# Patient Record
Sex: Female | Born: 1945 | Race: White | Hispanic: No | Smoking: Never smoker
Health system: Southern US, Community
[De-identification: ages and names within clinical notes are randomized; demographics above are authoritative.]

## PROBLEM LIST (undated history)

## (undated) DIAGNOSIS — F32A Depression, unspecified: Secondary | ICD-10-CM

## (undated) DIAGNOSIS — R51 Headache: Secondary | ICD-10-CM

## (undated) DIAGNOSIS — F329 Major depressive disorder, single episode, unspecified: Secondary | ICD-10-CM

## (undated) DIAGNOSIS — K766 Portal hypertension: Secondary | ICD-10-CM

## (undated) DIAGNOSIS — G4730 Sleep apnea, unspecified: Secondary | ICD-10-CM

## (undated) DIAGNOSIS — D696 Thrombocytopenia, unspecified: Secondary | ICD-10-CM

## (undated) DIAGNOSIS — G2581 Restless legs syndrome: Secondary | ICD-10-CM

## (undated) DIAGNOSIS — F419 Anxiety disorder, unspecified: Secondary | ICD-10-CM

## (undated) DIAGNOSIS — R011 Cardiac murmur, unspecified: Secondary | ICD-10-CM

## (undated) DIAGNOSIS — T4145XA Adverse effect of unspecified anesthetic, initial encounter: Secondary | ICD-10-CM

## (undated) DIAGNOSIS — N189 Chronic kidney disease, unspecified: Secondary | ICD-10-CM

## (undated) DIAGNOSIS — Z8489 Family history of other specified conditions: Secondary | ICD-10-CM

## (undated) DIAGNOSIS — J45909 Unspecified asthma, uncomplicated: Secondary | ICD-10-CM

## (undated) DIAGNOSIS — I471 Supraventricular tachycardia, unspecified: Secondary | ICD-10-CM

## (undated) DIAGNOSIS — D759 Disease of blood and blood-forming organs, unspecified: Secondary | ICD-10-CM

## (undated) DIAGNOSIS — K7460 Unspecified cirrhosis of liver: Secondary | ICD-10-CM

## (undated) DIAGNOSIS — C801 Malignant (primary) neoplasm, unspecified: Secondary | ICD-10-CM

## (undated) DIAGNOSIS — T8859XA Other complications of anesthesia, initial encounter: Secondary | ICD-10-CM

## (undated) DIAGNOSIS — I10 Essential (primary) hypertension: Secondary | ICD-10-CM

## (undated) DIAGNOSIS — R0602 Shortness of breath: Secondary | ICD-10-CM

## (undated) DIAGNOSIS — Z8719 Personal history of other diseases of the digestive system: Secondary | ICD-10-CM

## (undated) HISTORY — PX: MASTECTOMY: SHX3

## (undated) HISTORY — PX: APPENDECTOMY: SHX54

## (undated) HISTORY — DX: Supraventricular tachycardia: I47.1

## (undated) HISTORY — PX: COLONOSCOPY W/ BIOPSIES AND POLYPECTOMY: SHX1376

## (undated) HISTORY — PX: BREAST SURGERY: SHX581

## (undated) HISTORY — PX: TONSILLECTOMY: SUR1361

## (undated) HISTORY — PX: CHOLECYSTECTOMY: SHX55

## (undated) HISTORY — PX: ABDOMINAL HYSTERECTOMY: SHX81

## (undated) HISTORY — DX: Supraventricular tachycardia, unspecified: I47.10

---

## 1973-03-05 HISTORY — PX: CARDIAC CATHETERIZATION: SHX172

## 1998-02-06 ENCOUNTER — Emergency Department (HOSPITAL_COMMUNITY): Admission: EM | Admit: 1998-02-06 | Discharge: 1998-02-06 | Payer: Self-pay

## 1998-02-06 ENCOUNTER — Encounter: Payer: Self-pay | Admitting: Endocrinology

## 1998-08-05 ENCOUNTER — Ambulatory Visit (HOSPITAL_COMMUNITY): Admission: RE | Admit: 1998-08-05 | Discharge: 1998-08-05 | Payer: Self-pay | Admitting: Gastroenterology

## 1999-03-01 ENCOUNTER — Other Ambulatory Visit: Admission: RE | Admit: 1999-03-01 | Discharge: 1999-03-01 | Payer: Self-pay | Admitting: Gynecology

## 1999-08-27 ENCOUNTER — Emergency Department (HOSPITAL_COMMUNITY): Admission: EM | Admit: 1999-08-27 | Discharge: 1999-08-27 | Payer: Self-pay | Admitting: Emergency Medicine

## 1999-08-27 ENCOUNTER — Encounter: Payer: Self-pay | Admitting: Emergency Medicine

## 2000-09-23 ENCOUNTER — Other Ambulatory Visit: Admission: RE | Admit: 2000-09-23 | Discharge: 2000-09-23 | Payer: Self-pay | Admitting: Gynecology

## 2001-07-14 ENCOUNTER — Encounter: Admission: RE | Admit: 2001-07-14 | Discharge: 2001-07-14 | Payer: Self-pay | Admitting: Gastroenterology

## 2001-07-14 ENCOUNTER — Encounter: Payer: Self-pay | Admitting: Gastroenterology

## 2002-06-10 ENCOUNTER — Other Ambulatory Visit: Admission: RE | Admit: 2002-06-10 | Discharge: 2002-06-10 | Payer: Self-pay | Admitting: Radiology

## 2002-06-19 ENCOUNTER — Encounter: Admission: RE | Admit: 2002-06-19 | Discharge: 2002-06-19 | Payer: Self-pay | Admitting: Surgery

## 2002-06-19 ENCOUNTER — Encounter: Payer: Self-pay | Admitting: Surgery

## 2002-06-22 ENCOUNTER — Ambulatory Visit (HOSPITAL_BASED_OUTPATIENT_CLINIC_OR_DEPARTMENT_OTHER): Admission: RE | Admit: 2002-06-22 | Discharge: 2002-06-22 | Payer: Self-pay | Admitting: Surgery

## 2002-06-22 ENCOUNTER — Encounter: Payer: Self-pay | Admitting: Surgery

## 2002-06-22 ENCOUNTER — Encounter (INDEPENDENT_AMBULATORY_CARE_PROVIDER_SITE_OTHER): Payer: Self-pay | Admitting: Specialist

## 2002-07-14 ENCOUNTER — Ambulatory Visit: Admission: RE | Admit: 2002-07-14 | Discharge: 2002-09-22 | Payer: Self-pay | Admitting: Radiation Oncology

## 2002-10-20 ENCOUNTER — Ambulatory Visit (HOSPITAL_COMMUNITY): Admission: RE | Admit: 2002-10-20 | Discharge: 2002-10-20 | Payer: Self-pay | Admitting: Oncology

## 2002-10-20 ENCOUNTER — Encounter: Payer: Self-pay | Admitting: Oncology

## 2002-10-28 ENCOUNTER — Ambulatory Visit (HOSPITAL_COMMUNITY): Admission: RE | Admit: 2002-10-28 | Discharge: 2002-10-28 | Payer: Self-pay | Admitting: Gastroenterology

## 2002-10-28 ENCOUNTER — Encounter (INDEPENDENT_AMBULATORY_CARE_PROVIDER_SITE_OTHER): Payer: Self-pay | Admitting: Specialist

## 2003-03-06 DIAGNOSIS — C801 Malignant (primary) neoplasm, unspecified: Secondary | ICD-10-CM

## 2003-03-06 HISTORY — DX: Malignant (primary) neoplasm, unspecified: C80.1

## 2003-05-17 ENCOUNTER — Ambulatory Visit (HOSPITAL_COMMUNITY): Admission: RE | Admit: 2003-05-17 | Discharge: 2003-05-17 | Payer: Self-pay | Admitting: Endocrinology

## 2003-05-18 ENCOUNTER — Encounter: Admission: RE | Admit: 2003-05-18 | Discharge: 2003-05-18 | Payer: Self-pay | Admitting: Oncology

## 2004-01-03 ENCOUNTER — Encounter: Admission: RE | Admit: 2004-01-03 | Discharge: 2004-01-03 | Payer: Self-pay | Admitting: Oncology

## 2004-01-05 ENCOUNTER — Encounter: Admission: RE | Admit: 2004-01-05 | Discharge: 2004-01-05 | Payer: Self-pay | Admitting: Oncology

## 2004-05-04 ENCOUNTER — Ambulatory Visit: Payer: Self-pay | Admitting: Oncology

## 2004-07-18 ENCOUNTER — Encounter: Admission: RE | Admit: 2004-07-18 | Discharge: 2004-07-18 | Payer: Self-pay | Admitting: Oncology

## 2004-07-24 ENCOUNTER — Ambulatory Visit: Payer: Self-pay | Admitting: Oncology

## 2004-11-02 ENCOUNTER — Ambulatory Visit: Payer: Self-pay | Admitting: Oncology

## 2005-01-04 ENCOUNTER — Encounter: Admission: RE | Admit: 2005-01-04 | Discharge: 2005-01-04 | Payer: Self-pay | Admitting: Oncology

## 2005-07-29 ENCOUNTER — Ambulatory Visit: Payer: Self-pay | Admitting: Oncology

## 2006-01-23 ENCOUNTER — Encounter: Admission: RE | Admit: 2006-01-23 | Discharge: 2006-01-23 | Payer: Self-pay | Admitting: Oncology

## 2006-03-08 ENCOUNTER — Ambulatory Visit: Payer: Self-pay | Admitting: Oncology

## 2006-09-13 ENCOUNTER — Ambulatory Visit: Payer: Self-pay | Admitting: Oncology

## 2007-04-08 ENCOUNTER — Encounter: Admission: RE | Admit: 2007-04-08 | Discharge: 2007-04-08 | Payer: Self-pay | Admitting: Orthopaedic Surgery

## 2007-04-15 ENCOUNTER — Encounter: Admission: RE | Admit: 2007-04-15 | Discharge: 2007-05-07 | Payer: Self-pay | Admitting: Orthopaedic Surgery

## 2007-04-16 ENCOUNTER — Encounter: Admission: RE | Admit: 2007-04-16 | Discharge: 2007-04-16 | Payer: Self-pay | Admitting: Oncology

## 2007-04-23 ENCOUNTER — Ambulatory Visit: Payer: Self-pay | Admitting: Oncology

## 2008-01-28 ENCOUNTER — Ambulatory Visit: Payer: Self-pay | Admitting: Oncology

## 2008-01-28 LAB — MORPHOLOGY: PLT EST: DECREASED

## 2008-01-28 LAB — COMPREHENSIVE METABOLIC PANEL
Albumin: 3.7 g/dL (ref 3.5–5.2)
BUN: 11 mg/dL (ref 6–23)
Calcium: 8.5 mg/dL (ref 8.4–10.5)
Chloride: 107 mEq/L (ref 96–112)
Glucose, Bld: 145 mg/dL — ABNORMAL HIGH (ref 70–99)
Potassium: 3.2 mEq/L — ABNORMAL LOW (ref 3.5–5.3)

## 2008-01-28 LAB — CHCC SMEAR

## 2008-01-28 LAB — CBC WITH DIFFERENTIAL/PLATELET
Basophils Absolute: 0 10*3/uL (ref 0.0–0.1)
HCT: 32.2 % — ABNORMAL LOW (ref 34.8–46.6)
HGB: 11.4 g/dL — ABNORMAL LOW (ref 11.6–15.9)
MCH: 30.5 pg (ref 26.0–34.0)
MONO#: 0.1 10*3/uL (ref 0.1–0.9)
NEUT%: 53.2 % (ref 39.6–76.8)
Platelets: 59 10*3/uL — ABNORMAL LOW (ref 145–400)
WBC: 2.4 10*3/uL — ABNORMAL LOW (ref 3.9–10.0)
lymph#: 0.9 10*3/uL (ref 0.9–3.3)

## 2008-01-30 ENCOUNTER — Ambulatory Visit (HOSPITAL_COMMUNITY): Admission: RE | Admit: 2008-01-30 | Discharge: 2008-01-30 | Payer: Self-pay | Admitting: Oncology

## 2008-02-03 LAB — MORPHOLOGY
PLT EST: DECREASED
RBC Comments: NORMAL

## 2008-02-03 LAB — CBC WITH DIFFERENTIAL/PLATELET
Basophils Absolute: 0 10*3/uL (ref 0.0–0.1)
EOS%: 0 % (ref 0.0–7.0)
HGB: 12.5 g/dL (ref 11.6–15.9)
MCH: 30.4 pg (ref 26.0–34.0)
NEUT#: 1.8 10*3/uL (ref 1.5–6.5)
RDW: 15 % — ABNORMAL HIGH (ref 11.3–14.5)
lymph#: 1 10*3/uL (ref 0.9–3.3)

## 2008-02-12 ENCOUNTER — Encounter: Payer: Self-pay | Admitting: Oncology

## 2008-02-12 ENCOUNTER — Other Ambulatory Visit: Admission: RE | Admit: 2008-02-12 | Discharge: 2008-02-12 | Payer: Self-pay | Admitting: Oncology

## 2008-02-19 LAB — CBC WITH DIFFERENTIAL/PLATELET
BASO%: 0.1 % (ref 0.0–2.0)
Basophils Absolute: 0 10*3/uL (ref 0.0–0.1)
EOS%: 0 % (ref 0.0–7.0)
HGB: 13.7 g/dL (ref 11.6–15.9)
MCH: 30.5 pg (ref 26.0–34.0)
MCHC: 35.3 g/dL (ref 32.0–36.0)
RBC: 4.51 10*6/uL (ref 3.70–5.32)
RDW: 15.3 % — ABNORMAL HIGH (ref 11.3–14.5)
lymph#: 1 10*3/uL (ref 0.9–3.3)

## 2008-02-19 LAB — HOLD TUBE, BLOOD BANK

## 2008-02-19 LAB — PROTIME-INR: Protime: 13.2 Seconds (ref 10.6–13.4)

## 2008-02-23 LAB — CBC WITH DIFFERENTIAL/PLATELET
BASO%: 0.4 % (ref 0.0–2.0)
Basophils Absolute: 0 10*3/uL (ref 0.0–0.1)
EOS%: 0.5 % (ref 0.0–7.0)
Eosinophils Absolute: 0 10*3/uL (ref 0.0–0.5)
HCT: 35.6 % (ref 34.8–46.6)
HGB: 12.7 g/dL (ref 11.6–15.9)
LYMPH%: 39.6 % (ref 14.0–48.0)
MCH: 30 pg (ref 26.0–34.0)
MCHC: 35.7 g/dL (ref 32.0–36.0)
MCV: 84.1 fL (ref 81.0–101.0)
MONO#: 0.2 10*3/uL (ref 0.1–0.9)
MONO%: 9.5 % (ref 0.0–13.0)
NEUT#: 1.3 10*3/uL — ABNORMAL LOW (ref 1.5–6.5)
NEUT%: 50 % (ref 39.6–76.8)
Platelets: 57 10*3/uL — ABNORMAL LOW (ref 145–400)
RBC: 4.23 10*6/uL (ref 3.70–5.32)
RDW: 13.7 % (ref 11.3–14.5)
WBC: 2.6 10*3/uL — ABNORMAL LOW (ref 3.9–10.0)
lymph#: 1 10*3/uL (ref 0.9–3.3)

## 2008-03-19 ENCOUNTER — Ambulatory Visit: Payer: Self-pay | Admitting: Oncology

## 2008-03-23 ENCOUNTER — Ambulatory Visit (HOSPITAL_COMMUNITY): Admission: RE | Admit: 2008-03-23 | Discharge: 2008-03-23 | Payer: Self-pay | Admitting: Oncology

## 2008-03-23 LAB — CBC WITH DIFFERENTIAL/PLATELET
BASO%: 0.2 % (ref 0.0–2.0)
EOS%: 0.1 % (ref 0.0–7.0)
HCT: 36.9 % (ref 34.8–46.6)
MCHC: 34.5 g/dL (ref 32.0–36.0)
MONO#: 0.2 10*3/uL (ref 0.1–0.9)
RBC: 4.2 10*6/uL (ref 3.70–5.32)
RDW: 15.5 % — ABNORMAL HIGH (ref 11.3–14.5)
WBC: 2.5 10*3/uL — ABNORMAL LOW (ref 3.9–10.0)
lymph#: 0.8 10*3/uL — ABNORMAL LOW (ref 0.9–3.3)

## 2008-03-23 LAB — MORPHOLOGY: PLT EST: DECREASED

## 2008-03-23 LAB — COMPREHENSIVE METABOLIC PANEL
ALT: 21 U/L (ref 0–35)
AST: 32 U/L (ref 0–37)
BUN: 9 mg/dL (ref 6–23)
Creatinine, Ser: 0.78 mg/dL (ref 0.40–1.20)
Total Bilirubin: 1.4 mg/dL — ABNORMAL HIGH (ref 0.3–1.2)

## 2008-03-23 LAB — PROTHROMBIN TIME: INR: 1.1 (ref 0.0–1.5)

## 2008-03-23 LAB — IVY BLEEDING TIME: Bleeding Time: 11 Minutes — ABNORMAL HIGH (ref 2.0–8.0)

## 2008-03-23 LAB — APTT: aPTT: 36 seconds (ref 24–37)

## 2008-04-13 ENCOUNTER — Emergency Department (HOSPITAL_BASED_OUTPATIENT_CLINIC_OR_DEPARTMENT_OTHER): Admission: EM | Admit: 2008-04-13 | Discharge: 2008-04-14 | Payer: Self-pay | Admitting: Emergency Medicine

## 2008-04-14 ENCOUNTER — Ambulatory Visit: Payer: Self-pay | Admitting: Diagnostic Radiology

## 2008-04-16 ENCOUNTER — Encounter: Admission: RE | Admit: 2008-04-16 | Discharge: 2008-04-16 | Payer: Self-pay | Admitting: Oncology

## 2008-04-27 LAB — BASIC METABOLIC PANEL
Calcium: 9 mg/dL (ref 8.4–10.5)
Chloride: 104 mEq/L (ref 96–112)
Creatinine, Ser: 0.69 mg/dL (ref 0.40–1.20)

## 2008-04-27 LAB — CBC WITH DIFFERENTIAL/PLATELET
BASO%: 0.2 % (ref 0.0–2.0)
Eosinophils Absolute: 0 10*3/uL (ref 0.0–0.5)
HCT: 37.2 % (ref 34.8–46.6)
LYMPH%: 24.2 % (ref 14.0–49.7)
MONO#: 0.3 10*3/uL (ref 0.1–0.9)
NEUT#: 2.4 10*3/uL (ref 1.5–6.5)
NEUT%: 67.2 % (ref 38.4–76.8)
Platelets: 65 10*3/uL — ABNORMAL LOW (ref 145–400)
RBC: 4.2 10*6/uL (ref 3.70–5.45)
WBC: 3.6 10*3/uL — ABNORMAL LOW (ref 3.9–10.3)
lymph#: 0.9 10*3/uL (ref 0.9–3.3)

## 2008-04-27 LAB — MORPHOLOGY: PLT EST: DECREASED

## 2008-04-30 LAB — VON WILLEBRAND PANEL
Factor-VIII Activity: 154 % — ABNORMAL HIGH (ref 50–150)
Ristocetin-Cofactor: >150 % — ABNORMAL HIGH (ref 50–150)
Von Willebrand Ag: 315 % normal — ABNORMAL HIGH (ref 61–164)

## 2008-05-21 ENCOUNTER — Inpatient Hospital Stay (HOSPITAL_COMMUNITY): Admission: EM | Admit: 2008-05-21 | Discharge: 2008-05-24 | Payer: Self-pay | Admitting: Emergency Medicine

## 2008-06-23 ENCOUNTER — Ambulatory Visit: Payer: Self-pay | Admitting: Oncology

## 2008-06-25 LAB — CBC WITH DIFFERENTIAL/PLATELET
BASO%: 0.2 % (ref 0.0–2.0)
Basophils Absolute: 0 10e3/uL (ref 0.0–0.1)
EOS%: 0 % (ref 0.0–7.0)
Eosinophils Absolute: 0 10e3/uL (ref 0.0–0.5)
HCT: 36.7 % (ref 34.8–46.6)
HGB: 12.8 g/dL (ref 11.6–15.9)
LYMPH%: 34.5 % (ref 14.0–49.7)
MCH: 30.9 pg (ref 25.1–34.0)
MCHC: 34.8 g/dL (ref 31.5–36.0)
MCV: 88.8 fL (ref 79.5–101.0)
MONO#: 0.2 10e3/uL (ref 0.1–0.9)
MONO%: 9.2 % (ref 0.0–14.0)
NEUT#: 1.5 10e3/uL (ref 1.5–6.5)
NEUT%: 56.1 % (ref 38.4–76.8)
Platelets: 66 10e3/uL — ABNORMAL LOW (ref 145–400)
RBC: 4.14 10e6/uL (ref 3.70–5.45)
RDW: 14.9 % — ABNORMAL HIGH (ref 11.2–14.5)
WBC: 2.7 10e3/uL — ABNORMAL LOW (ref 3.9–10.3)
lymph#: 0.9 10e3/uL (ref 0.9–3.3)

## 2008-06-25 LAB — COMPREHENSIVE METABOLIC PANEL
CO2: 30 mEq/L (ref 19–32)
Calcium: 9 mg/dL (ref 8.4–10.5)
Chloride: 103 mEq/L (ref 96–112)
Creatinine, Ser: 0.87 mg/dL (ref 0.40–1.20)
Glucose, Bld: 125 mg/dL — ABNORMAL HIGH (ref 70–99)
Total Bilirubin: 0.8 mg/dL (ref 0.3–1.2)

## 2008-08-17 HISTORY — PX: NM MYOCAR PERF WALL MOTION: HXRAD629

## 2008-12-16 ENCOUNTER — Ambulatory Visit (HOSPITAL_COMMUNITY)
Admission: RE | Admit: 2008-12-16 | Discharge: 2008-12-16 | Payer: Self-pay | Admitting: Certified Registered Nurse Anesthetist

## 2008-12-24 ENCOUNTER — Ambulatory Visit (HOSPITAL_COMMUNITY): Admission: RE | Admit: 2008-12-24 | Discharge: 2008-12-24 | Payer: Self-pay | Admitting: Cardiovascular Disease

## 2008-12-29 ENCOUNTER — Ambulatory Visit: Payer: Self-pay | Admitting: Oncology

## 2008-12-31 LAB — CBC WITH DIFFERENTIAL/PLATELET
Eosinophils Absolute: 0 10*3/uL (ref 0.0–0.5)
LYMPH%: 33.2 % (ref 14.0–49.7)
MONO#: 0.3 10*3/uL (ref 0.1–0.9)
NEUT#: 1.9 10*3/uL (ref 1.5–6.5)
Platelets: 60 10*3/uL — ABNORMAL LOW (ref 145–400)
RBC: 4.4 10*6/uL (ref 3.70–5.45)
WBC: 3.3 10*3/uL — ABNORMAL LOW (ref 3.9–10.3)
lymph#: 1.1 10*3/uL (ref 0.9–3.3)

## 2009-01-03 ENCOUNTER — Emergency Department (HOSPITAL_BASED_OUTPATIENT_CLINIC_OR_DEPARTMENT_OTHER): Admission: EM | Admit: 2009-01-03 | Discharge: 2009-01-03 | Payer: Self-pay | Admitting: Emergency Medicine

## 2009-01-07 ENCOUNTER — Ambulatory Visit: Payer: Self-pay | Admitting: Diagnostic Radiology

## 2009-01-07 ENCOUNTER — Encounter: Payer: Self-pay | Admitting: Emergency Medicine

## 2009-01-07 ENCOUNTER — Inpatient Hospital Stay (HOSPITAL_COMMUNITY): Admission: AD | Admit: 2009-01-07 | Discharge: 2009-01-09 | Payer: Self-pay | Admitting: Internal Medicine

## 2009-04-18 ENCOUNTER — Encounter: Admission: RE | Admit: 2009-04-18 | Discharge: 2009-04-18 | Payer: Self-pay | Admitting: Oncology

## 2009-05-01 ENCOUNTER — Ambulatory Visit: Payer: Self-pay | Admitting: Diagnostic Radiology

## 2009-05-10 ENCOUNTER — Ambulatory Visit: Payer: Self-pay | Admitting: Vascular Surgery

## 2009-09-18 ENCOUNTER — Emergency Department (HOSPITAL_BASED_OUTPATIENT_CLINIC_OR_DEPARTMENT_OTHER): Admission: EM | Admit: 2009-09-18 | Discharge: 2009-09-19 | Payer: Self-pay | Admitting: Emergency Medicine

## 2009-09-19 ENCOUNTER — Ambulatory Visit: Payer: Self-pay | Admitting: Diagnostic Radiology

## 2010-01-04 ENCOUNTER — Ambulatory Visit: Payer: Self-pay | Admitting: Oncology

## 2010-02-09 ENCOUNTER — Emergency Department (HOSPITAL_BASED_OUTPATIENT_CLINIC_OR_DEPARTMENT_OTHER): Admission: EM | Admit: 2010-02-09 | Discharge: 2009-05-01 | Payer: Self-pay | Admitting: Emergency Medicine

## 2010-02-15 ENCOUNTER — Ambulatory Visit: Payer: Self-pay | Admitting: Oncology

## 2010-02-17 LAB — CBC WITH DIFFERENTIAL/PLATELET
Basophils Absolute: 0 10*3/uL (ref 0.0–0.1)
EOS%: 0 % (ref 0.0–7.0)
HCT: 38.3 % (ref 34.8–46.6)
HGB: 13.1 g/dL (ref 11.6–15.9)
LYMPH%: 29.4 % (ref 14.0–49.7)
MCH: 30.9 pg (ref 25.1–34.0)
MCV: 90.1 fL (ref 79.5–101.0)
MONO%: 7.6 % (ref 0.0–14.0)
NEUT%: 62.7 % (ref 38.4–76.8)
Platelets: 74 10*3/uL — ABNORMAL LOW (ref 145–400)

## 2010-03-26 ENCOUNTER — Encounter: Payer: Self-pay | Admitting: Oncology

## 2010-04-18 ENCOUNTER — Encounter: Payer: Self-pay | Admitting: Oncology

## 2010-04-30 ENCOUNTER — Emergency Department (HOSPITAL_BASED_OUTPATIENT_CLINIC_OR_DEPARTMENT_OTHER)
Admission: EM | Admit: 2010-04-30 | Discharge: 2010-04-30 | Disposition: A | Discharge status: Home / Self Care | Payer: Medicare Other | Attending: Emergency Medicine | Admitting: Emergency Medicine

## 2010-04-30 ENCOUNTER — Emergency Department (INDEPENDENT_AMBULATORY_CARE_PROVIDER_SITE_OTHER): Payer: Medicare Other

## 2010-04-30 DIAGNOSIS — IMO0001 Reserved for inherently not codable concepts without codable children: Secondary | ICD-10-CM | POA: Insufficient documentation

## 2010-04-30 DIAGNOSIS — J3489 Other specified disorders of nose and nasal sinuses: Secondary | ICD-10-CM | POA: Insufficient documentation

## 2010-04-30 DIAGNOSIS — R059 Cough, unspecified: Secondary | ICD-10-CM | POA: Insufficient documentation

## 2010-04-30 DIAGNOSIS — R0602 Shortness of breath: Secondary | ICD-10-CM

## 2010-04-30 DIAGNOSIS — R05 Cough: Secondary | ICD-10-CM

## 2010-04-30 DIAGNOSIS — R51 Headache: Secondary | ICD-10-CM | POA: Insufficient documentation

## 2010-04-30 DIAGNOSIS — J40 Bronchitis, not specified as acute or chronic: Secondary | ICD-10-CM | POA: Insufficient documentation

## 2010-04-30 DIAGNOSIS — J029 Acute pharyngitis, unspecified: Secondary | ICD-10-CM | POA: Insufficient documentation

## 2010-05-08 ENCOUNTER — Other Ambulatory Visit: Payer: Self-pay | Admitting: Oncology

## 2010-05-08 DIAGNOSIS — Z9889 Other specified postprocedural states: Secondary | ICD-10-CM

## 2010-05-17 ENCOUNTER — Ambulatory Visit: Payer: Medicare Other

## 2010-06-07 LAB — HEPATIC FUNCTION PANEL
ALT: 34 U/L (ref 0–35)
AST: 48 U/L — ABNORMAL HIGH (ref 0–37)
AST: 25 U/L (ref 0–37)
AST: 45 U/L — ABNORMAL HIGH (ref 0–37)
Albumin: 3.6 g/dL (ref 3.5–5.2)
Alkaline Phosphatase: 157 U/L — ABNORMAL HIGH (ref 39–117)
Alkaline Phosphatase: 158 U/L — ABNORMAL HIGH (ref 39–117)
Bilirubin, Direct: 0 mg/dL (ref 0.0–0.3)
Indirect Bilirubin: 0.5 mg/dL (ref 0.3–0.9)
Indirect Bilirubin: 0.5 mg/dL (ref 0.3–0.9)
Total Bilirubin: 0.5 mg/dL (ref 0.3–1.2)
Total Protein: 6.7 g/dL (ref 6.0–8.3)
Total Protein: 6.8 g/dL (ref 6.0–8.3)

## 2010-06-07 LAB — BASIC METABOLIC PANEL
BUN: 13 mg/dL (ref 6–23)
CO2: 29 mEq/L (ref 19–32)
Calcium: 9.4 mg/dL (ref 8.4–10.5)
Calcium: 8.8 mg/dL (ref 8.4–10.5)
Calcium: 9 mg/dL (ref 8.4–10.5)
Chloride: 108 mEq/L (ref 96–112)
Creatinine, Ser: 0.9 mg/dL (ref 0.4–1.2)
Creatinine, Ser: 0.79 mg/dL (ref 0.4–1.2)
GFR calc Af Amer: >60 mL/min (ref >60)
GFR calc non Af Amer: >60 mL/min (ref >60)
GFR calc non Af Amer: >60 mL/min (ref >60)
Glucose, Bld: 118 mg/dL — ABNORMAL HIGH (ref 70–99)
Potassium: 3.6 mEq/L (ref 3.5–5.1)
Sodium: 143 mEq/L (ref 135–145)
Sodium: 139 mEq/L (ref 135–145)

## 2010-06-07 LAB — CBC
HCT: 35.8 % — ABNORMAL LOW (ref 36.0–46.0)
Hemoglobin: 13.8 g/dL (ref 12.0–15.0)
Hemoglobin: 12.8 g/dL (ref 12.0–15.0)
MCHC: 34.8 g/dL (ref 30.0–36.0)
Platelets: 69 10*3/uL — ABNORMAL LOW (ref 150–400)
Platelets: 73 10*3/uL — ABNORMAL LOW (ref 150–400)
RBC: 4.03 MIL/uL (ref 3.87–5.11)
RDW: 12.9 % (ref 11.5–15.5)
WBC: 5.3 10*3/uL (ref 4.0–10.5)
WBC: 5.4 10*3/uL (ref 4.0–10.5)

## 2010-06-07 LAB — DIFFERENTIAL
Basophils Absolute: 0.1 10*3/uL (ref 0.0–0.1)
Basophils Relative: 2 % — ABNORMAL HIGH (ref 0–1)
Eosinophils Relative: 0 % (ref 0–5)
Eosinophils Relative: 0 % (ref 0–5)
Lymphocytes Relative: 21 % (ref 12–46)
Lymphocytes Relative: 8 % — ABNORMAL LOW (ref 12–46)
Lymphs Abs: 0.4 10*3/uL — ABNORMAL LOW (ref 0.7–4.0)
Lymphs Abs: 1.2 10*3/uL (ref 0.7–4.0)
Monocytes Absolute: 0.2 10*3/uL (ref 0.1–1.0)
Monocytes Absolute: 0.1 10*3/uL (ref 0.1–1.0)
Monocytes Relative: 7 % (ref 3–12)
Neutro Abs: 1.7 10*3/uL (ref 1.7–7.7)
Neutro Abs: 3.9 10*3/uL (ref 1.7–7.7)
Neutro Abs: 4.8 10*3/uL (ref 1.7–7.7)
Neutrophils Relative %: 72 % (ref 43–77)

## 2010-06-07 LAB — GLUCOSE, CAPILLARY
Glucose-Capillary: 111 mg/dL — ABNORMAL HIGH (ref 70–99)
Glucose-Capillary: 175 mg/dL — ABNORMAL HIGH (ref 70–99)
Glucose-Capillary: 184 mg/dL — ABNORMAL HIGH (ref 70–99)

## 2010-06-07 LAB — URINALYSIS, ROUTINE W REFLEX MICROSCOPIC
Nitrite: NEGATIVE
Specific Gravity, Urine: 1.022 (ref 1.005–1.030)
Urobilinogen, UA: 1 mg/dL (ref 0.0–1.0)

## 2010-06-07 LAB — POCT CARDIAC MARKERS
CKMB, poc: <1 ng/mL — ABNORMAL LOW (ref 1.0–8.0)
Troponin i, poc: <0.05 ng/mL (ref 0.00–0.09)

## 2010-06-07 LAB — AMMONIA: Ammonia: 20 umol/L (ref 11–35)

## 2010-06-07 LAB — URINE CULTURE

## 2010-06-15 LAB — HEPATIC FUNCTION PANEL
Albumin: 3 g/dL — ABNORMAL LOW (ref 3.5–5.2)
Total Protein: 5.4 g/dL — ABNORMAL LOW (ref 6.0–8.3)

## 2010-06-15 LAB — BASIC METABOLIC PANEL
BUN: 9 mg/dL (ref 6–23)
CO2: 24 mEq/L (ref 19–32)
CO2: 29 mEq/L (ref 19–32)
CO2: 29 mEq/L (ref 19–32)
Calcium: 8.3 mg/dL — ABNORMAL LOW (ref 8.4–10.5)
Calcium: 8.4 mg/dL (ref 8.4–10.5)
Chloride: 104 mEq/L (ref 96–112)
Chloride: 106 mEq/L (ref 96–112)
Chloride: 110 mEq/L (ref 96–112)
Creatinine, Ser: 0.64 mg/dL (ref 0.4–1.2)
Creatinine, Ser: 0.66 mg/dL (ref 0.4–1.2)
Creatinine, Ser: 0.78 mg/dL (ref 0.4–1.2)
GFR calc Af Amer: >60 mL/min (ref >60)
GFR calc Af Amer: >60 mL/min (ref >60)
GFR calc Af Amer: >60 mL/min (ref >60)
Glucose, Bld: 152 mg/dL — ABNORMAL HIGH (ref 70–99)
Potassium: 3.2 mEq/L — ABNORMAL LOW (ref 3.5–5.1)
Sodium: 139 mEq/L (ref 135–145)

## 2010-06-15 LAB — CBC
HCT: 37.3 % (ref 36.0–46.0)
MCHC: 34.6 g/dL (ref 30.0–36.0)
MCHC: 34.6 g/dL (ref 30.0–36.0)
MCV: 88 fL (ref 78.0–100.0)
Platelets: 50 10*3/uL — ABNORMAL LOW (ref 150–400)
Platelets: 51 10*3/uL — ABNORMAL LOW (ref 150–400)
RBC: 4 MIL/uL (ref 3.87–5.11)
RBC: 4.24 MIL/uL (ref 3.87–5.11)
RDW: 15.2 % (ref 11.5–15.5)
WBC: 2.9 10*3/uL — ABNORMAL LOW (ref 4.0–10.5)
WBC: 3.5 10*3/uL — ABNORMAL LOW (ref 4.0–10.5)

## 2010-06-15 LAB — COMPREHENSIVE METABOLIC PANEL
ALT: 39 U/L — ABNORMAL HIGH (ref 0–35)
ALT: 43 U/L — ABNORMAL HIGH (ref 0–35)
AST: 73 U/L — ABNORMAL HIGH (ref 0–37)
Alkaline Phosphatase: 217 U/L — ABNORMAL HIGH (ref 39–117)
BUN: 18 mg/dL (ref 6–23)
CO2: 26 mEq/L (ref 19–32)
CO2: 28 mEq/L (ref 19–32)
Calcium: 7.8 mg/dL — ABNORMAL LOW (ref 8.4–10.5)
Chloride: 103 mEq/L (ref 96–112)
Creatinine, Ser: 0.82 mg/dL (ref 0.4–1.2)
GFR calc Af Amer: >60 mL/min (ref >60)
GFR calc Af Amer: >60 mL/min (ref >60)
GFR calc non Af Amer: >60 mL/min (ref >60)
Potassium: 2.6 mEq/L — CL (ref 3.5–5.1)
Potassium: 2.8 mEq/L — ABNORMAL LOW (ref 3.5–5.1)
Sodium: 135 mEq/L (ref 135–145)

## 2010-06-15 LAB — DIFFERENTIAL
Basophils Relative: 2 % — ABNORMAL HIGH (ref 0–1)
Eosinophils Absolute: 0 10*3/uL (ref 0.0–0.7)
Eosinophils Relative: 0 % (ref 0–5)
Lymphs Abs: 0.7 10*3/uL (ref 0.7–4.0)
Monocytes Absolute: 0.2 10*3/uL (ref 0.1–1.0)
Monocytes Relative: 7 % (ref 3–12)

## 2010-06-15 LAB — MITOCHONDRIAL ANTIBODIES: Mitochondrial M2 Ab, IgG: <20.1 Units (ref <20.1)

## 2010-06-15 LAB — PROTIME-INR: INR: 1.4 (ref 0.00–1.49)

## 2010-06-15 LAB — ANTI-SMOOTH MUSCLE ANTIBODY, IGG: F-Actin IgG: <20 U (ref <20)

## 2010-06-15 LAB — IRON: Iron: 52 ug/dL (ref 42–135)

## 2010-06-15 LAB — MAGNESIUM: Magnesium: 1.8 mg/dL (ref 1.5–2.5)

## 2010-07-18 NOTE — Procedures (Signed)
CAROTID DUPLEX EXAM   INDICATION:  Bruit.   HISTORY:  Diabetes:  No.  Cardiac:  No.  Hypertension:  No.  Smoking:  No.  Previous Surgery:  No.  CV History:  No.  Amaurosis Fugax No, Paresthesias No, Hemiparesis No.                                       RIGHT             LEFT  Brachial systolic pressure:         138               Mastectomy  Brachial Doppler waveforms:         Triphasic  Vertebral direction of flow:        Antegrade         Antegrade  DUPLEX VELOCITIES (cm/sec)  CCA peak systolic                   98                83  ECA peak systolic                   102               109  ICA peak systolic                   71                106  ICA end diastolic                   23                37  PLAQUE MORPHOLOGY:                  Heterogenous      Heterogenous  PLAQUE AMOUNT:                      Mild              Mild  PLAQUE LOCATION:                    ICA               ICA   IMPRESSION:  1. Bilateral internal carotid artery suggests 20-39% stenosis.  2. Antegrade flow in bilateral vertebrals.        ___________________________________________  Quita Skye Hart [place], M.D.   CB/MEDQ  D:  05/10/2009  T:  05/10/2009  Job:  [id number]

## 2010-07-18 NOTE — Discharge Summary (Signed)
NAMEJAMES, [name]               ACCOUNT NO.:  [id number]   MEDICAL RECORD NO.:  [id number]          PATIENT TYPE:  INP   LOCATION:  1523                         FACILITY:  [facility]   PHYSICIAN:  Tera Mater. Evlyn Kanner, M.D. DATE OF BIRTH:  11/10/1945   DATE OF ADMISSION:  05/20/2008  DATE OF DISCHARGE:  05/24/2008                               DISCHARGE SUMMARY   DISCHARGE DIAGNOSES:  1. Intractable nausea, vomiting and diarrhea, now resolved.  2. Significant recurrent hypokalemia, now somewhat improved.  3. Recently diagnosed cryptogenic cirrhosis with planned evaluation at      [facility] this week.  4. Thrombocytopenia due to cirrhosis.  5. Splenomegaly again demonstrated on x-ray.  6. Significant headache with a CT showing no evidence of metastatic      disease.  7. History of breast cancer.  8. Chronic back pain.  9. Depression.   PROCEDURES:  Included a CT of the head on the 21st.   CONSULTATIONS:  None.   [name] is a 65 year old white female, longstanding patient in my  practice, who presented to my partner, Dr. Timothy Lasso, on the May 20, 2008.  She presented with nausea, vomiting, diarrhea and significant volume  depletion.  She was weak and having difficulty with dealing with this  illness.  Her hospitalization went relatively well.  She did have still  quite a bit weakness and recurrent significant hypokalemia.  Interestingly over the years, she has had problems with low potassium  with an extensive workup in the past that was negative for any  aldosterone access.  During the weekend here, she did have sympathetic  encephalopathy noted and it was improved some with neomycin briefly.  In  addition, with her worsening headaches, the possibility of metastatic  cranial disease was entertained and she underwent a CT which fortunately  showed no evidence of problems.  At present time, she is back ambulating  although still somewhat weak.  Her potassium has come up some and  is  still a bit low at 3.3.  She has been as low as 2.8.  This morning's  vital signs:  Blood pressure is 131/73, pulse 63, respirations 16,  temperature 98, O2 saturation 97% on room air.  Her highest temperature  was on the 19th.  It is 99 degrees.   RADIOLOGY TESTING:  CT of the head with and without contrast showed no  acute intracranial abnormalities, specifically, no evidence of  metastatic disease.  The sinuses looked fine.  There was symmetric  hyperdensities in the basal ganglia either related to leucovorin or  partial prominent perivascular spaces.  An abdominal film on the 18th  showed a benign bowel gas pattern and the spleen tip 17 cm below the  superior margin of the film.  Bone demineralization was suggested.   CHEMISTRIES:  This morning sodium was 139, potassium 3.3, chloride 106,  CO2 29, BUN 5, creatinine 0.62, glucose of 107, a calcium was 8.4.  Yesterday potassium is 3.2.  Hepatic function testing on the 20th showed  alk phos of 232, SGOT of 44, SGPT of 31, total protein of 5.4,  albumin  3.0, total bilirubin 1.2, direct bilirubin 0.4, indirect bilirubin 0.8.  A CBC on the 20th showed a white count of 2900, hemoglobin 11.8 with an  MCV 89.1, platelets 50,000.  Iron level was 52 on the 19th.  Ammonia  initially was 52 and this morning is 61.  Initial white count was 2900  as well.  Initial potassium was 2.6, BUN was 18, creatinine 0.82,  glucose of 121.  Potassium was only 2.6.  INR was 1.4, PTT was 35.   In summary, we have a 65 year old white female presenting with  significant volume depletion in the setting of a vomiting and diarrheal  illness.  Fortunately, this is improved although she is left with a bit  of residual hypokalemia.  I expect that this will resolve and I am going  to give her some magnesium to help the potassium come up.  In addition,  she has a substantial amount of hepatic dysfunction, again demonstrated  with a workup planned.  Fortunately,  there was no evidence of  intracranial mets despite some headaches during this hospitalization.  She is now ready for discharge.   MEDICATIONS AT DISCHARGE:  1. Wellbutrin XL 300 mg one daily.  2. Requip 1 mg at night-time.  3. Transdermal fentanyl patch 75 mcg daily.  4. Lasix on a p.r.n. basis, 20 mg.  5. For the next 2 weeks, she will be on magnesium oxide 400 mg twice a      day.  6. Potassium 40 mEq once daily.   She will see me in 1 to 2 weeks.  She has the followup planned at [facility]  as well.   DIET:  Regular.           ______________________________  Tera Mater. Evlyn Kanner, M.D.     SAS/MEDQ  D:  05/24/2008  T:  05/24/2008  Job:  [id number]

## 2010-07-18 NOTE — H&P (Signed)
[name], [name] NO.:  [id number]   MEDICAL RECORD NO.:  [id number]          PATIENT TYPE:  OBV   LOCATION:  1523                         FACILITY:  [facility]   PHYSICIAN:  Grace Pounds, MD       DATE OF BIRTH:  06/14/1945   DATE OF ADMISSION:  05/20/2008  DATE OF DISCHARGE:                              HISTORY & PHYSICAL   PRIMARY CARE PHYSICIAN:  Dr. Evlyn Carr.   ONCOLOGIST:  Dr. Darrold Carr.   GI:  Dr. Ewing Carr and [facility] is Dr. Ardine Carr.   CHIEF COMPLAINT:  Intractable nausea, vomiting, diarrhea and  dehydration.   HISTORY OF PRESENT ILLNESS:  This 65 year old female with cryptogenic  cirrhosis and history of breast cancer presents today to the ED with 24  hours of nausea, vomiting, diarrhea.  Vomitus without blood. It is  bilious.  It is associated with upper abdominal pain.  She vomited 15  times last night, felt a little better this morning but very weak and  eventually came to the ED.  She is currently too sick to go home.  In  the ED, she was treated with Protonix 40 mg IV, IV fluids, morphine 4  mg, potassium for her underlying hypokalemia.  Recent CT abdomen and  pelvis dated March 23, 2008, showed portal hypertension and cirrhosis.  Other pertinent information for this admission is 101 fever last night,  soup yesterday but had been relatively anorectic and just really feeling  ill.  She has had upper abdominal tenderness and pain for 36 hours, and  her last bowel movement was diarrhea and that was 6 hours ago.   PAST MEDICAL HISTORY:  1. Cryptogenic cirrhosis.  2. History of breast cancer status post lumpectomy and radiation      treatment.  3. History of portal hypertension.  4. History of thrombocytopenia.  5. History of splenomegaly.  6. History of polypectomy.  7. History of chronic low back pain on chronic narcotics.  8. Depression.  9. Peripheral vascular disease.  10.Status post cholecystectomy.  11.Fall and broken rib in November 2009.   ALLERGIES:  1. ASPIRIN.  2. PENICILLIN.  3. ZOFRAN but she can tolerate Phenergan.   MEDICATIONS:  Include:  1. Wellbutrin.  2. Requip.  3. Fentanyl.   SOCIAL HISTORY:  No alcohol.  No tobacco.  She lives alone.  She is  widowed.   FAMILY HISTORY:  [name] has liver cancer.   REVIEW OF SYSTEMS:  Fever, chills, nausea, vomiting, abdominal pain,  diarrhea, dehydration, weakness and some shortness of breath and being  weak.  She has no blood in her stool or urine, although a little dark.  No pruritus.  No chest pain.  All other organ systems reviewed and  negative.   PHYSICAL EXAMINATION:  VITAL SIGNS:  Blood pressure 131/37 to 145/61,  heart rate 84, respiratory rate 18-20, saturating 97% on room air.  GENERAL:  Alert and ordered x3.  PULMONARY:  Clear to auscultation bilaterally.  CARDIAC:  Regular.  ABDOMEN:  Nontender, nondistended, increased bowel sounds.  No rebound,  no guarding.  EXTREMITIES:  No  edema.  HEENT:  Oropharynx was dry.  Neck no JVD.  No asterixis.  Scleral  icterus noted.   ANCILLARY DATA:  Sodium 140, potassium 2.6, chloride 103, bicarb 28, BUN  18, creatinine 0.82, glucose 121.  Alk phos is 218, AST 50, ALT 39,  total bilirubin 2.4, total protein 6.0, albumin 3.4, calcium 8.5.  White  count 3.5, hemoglobin 13.4, platelet count 51.  PTT 35, INR 1.4.   ASSESSMENT:  We will admit this 65 year old female with cryptogenic  cirrhosis for presumed viral gastroenteritis, including nausea,  vomiting, diarrhea and dehydration.  She is improving.  She looks good  just she is not ready to go home.   PLAN:  1. Twenty-three-hour observation.  2. Phenergan.  She reports she can tolerate, so we will go ahead and      start that.  3. Continue fentanyl.  Add morphine for p.r.n. use.  4. Protonix IV.  5. IV fluids for hydration.  6. Check KUB x1.  7. Follow up labs in the morning.  Follow up on ammonia level.  8. Replete the potassium.  9. Thrombocytopenia as  expected and stable.  10.Squeezers for DVT prophylaxis.  Secondary to increased INR and      decreased platelets, we will not do Lovenox.  11.Dr. Evlyn Carr to see in the morning and probably get her home.  She      should be a lot better by then.      Grace Pounds, MD  Electronically Signed     JMR/MEDQ  D:  05/20/2008  T:  05/21/2008  Job:  [id number]   cc:   [name]. [name], M.D.  Fax: [phone]   [name], M.D.  Fax: [phone]   [name], M.D.  Fax: [phone]   [name]. Grace Eng, MD  [facility] [address]  [place], [place] [address]  [phone]

## 2010-07-21 NOTE — Op Note (Signed)
NAME:  [name], [name]                         ACCOUNT NO.:  [id number]   MEDICAL RECORD NO.:  [id number]                   PATIENT TYPE:  AMB   LOCATION:  ENDO                                 FACILITY:  [facility]   PHYSICIAN:  Petra Kuba, M.D.                 DATE OF BIRTH:  Apr 15, 1945   DATE OF PROCEDURE:  10/28/2002  DATE OF DISCHARGE:                                 OPERATIVE REPORT   PROCEDURE PERFORMED:  Colonoscopy with polypectomy.   ENDOSCOPIST:  Petra Kuba, M.D.   INDICATIONS FOR PROCEDURE:  Patient with history of colon polyps due for  repeat screening.  Consent was signed after the risks, benefits, methods and  options were thoroughly discussed multiple times in the past.   MEDICINES USED:  Demerol 100 mg, Versed 10 mg.   DESCRIPTION OF PROCEDURE:  Rectal inspection was pertinent for external  hemorrhoids.  Digital exam was negative.  A video pediatric adjustable  colonoscope was inserted and fairly easily advanced around the colon to the  cecum.  This did require abdominal pressure, rolling her on her back.  On  insertion, no obvious abnormalities were seen.  The cecum was identified by  the appendiceal orifice and the ileocecal valve.  The prep was adequate.  There was some liquid stool that required washing and suctioning.  In the  cecal pole, a tiny questionable polyp was seen and was cold biopsied times  two, put in the first container.  Scope was slowly withdrawn.  In the mid to  proximal transverse, a small polyp was seen, snare electrocautery applied  and the polyp was suctioned through the scope and collected in the rap.  On  slow withdrawal through the left side of the colon, three other tiny  probably hyperplastic-appearing polyps were seen in the descending, sigmoid  and rectum were all cold biopsied and put in the same container with the  polyp that was snared.  No other abnormalities were seen as we withdrew back  to the rectum.  Anorectal pullthrough  and retroflexion confirmed some small  hemorrhoids.  Scope was reinserted a short ways up the left side of the  colon, air was suctioned, scope removed.  The patient tolerated the  procedure well.  There was no immediate obvious complication.   ENDOSCOPIC DIAGNOSIS:  1. Internal and external hemorrhoid.  2. Tiny, probable hyperplastic appearing polyps in the rectal, sigmoid,     descending and cecum, all cold biopsied.  3. Small mid to proximal transverse polyp snared.  4. Otherwise within normal limits to the cecum.    PLAN:  Await pathology to determine future colonic screening.  Happy to see  back p.r.n.  Otherwise return care to Lennis P. Darrold Span, M.D. and Tera Mater.  [place], M.D. for the customary health care maintenance to include yearly  rectals and guaiacs.  Petra Kuba, M.D.    MEM/MEDQ  D:  10/28/2002  T:  10/29/2002  Job:  [id number]   cc:   Lennis P. Darrold Span, M.D.  [address]  [place]  [place] [address]  Fax: [phone]   Tera Mater. Evlyn Kanner, M.D.  [address]  [place]  [place] [address]  Fax: [phone]

## 2010-07-21 NOTE — Op Note (Signed)
NAME:  [name], [name]                         ACCOUNT NO.:  [id number]   MEDICAL RECORD NO.:  [id number]                   PATIENT TYPE:  AMB   LOCATION:  DSC                                  FACILITY:  [facility]   PHYSICIAN:  Sandria Bales. Ezzard Standing, M.D.               DATE OF BIRTH:  Sep 28, 1945   DATE OF PROCEDURE:  06/22/2002  DATE OF DISCHARGE:                                 OPERATIVE REPORT   PREOPERATIVE DIAGNOSIS:  Left breast carcinoma, 4 o'clock position.   POSTOPERATIVE DIAGNOSIS:  Left breast carcinoma, 4 o'clock position with  negative sentinel lymph node.   PROCEDURE:  Needle localization excision of left breast carcinoma (partial  mastectomy).  Injection of isosulfan blue with identification of the left  sentinel lymph node.   SURGEON:  Sandria Bales. Ezzard Standing, M.D.   ANESTHESIA:  General with LMA.   COMPLICATIONS:  None.   INDICATIONS FOR PROCEDURE:  The patient is a 65 year old white female who  has a biopsy-proven carcinoma of the left breast. She now comes for a wide  excision of this, for a lumpectomy, and then a sentinel lymph node biopsy.  I discussed with her both the indications and potential complications of  breast surgery including, but not limited to, bleeding and infection. I also  discussed with her the options of lumpectomy and sentinel lymph node biopsy  versus a mastectomy both with and without reconstruction and sentinel lymph  node biopsy.  I also explained to her possibly needing to have a axillary  node dissection and the complications of that, including, but not limited to  seromas and nerve injury.  The patient now comes for left breast lumpectomy  (marked partial mastectomy) and sentinel lymph node biopsy.  She is  undergoing injection of 1 millicurie of radio isotope in the periareolar  position and she has had a wire placed in the left breast by Jeralyn Ruths, M.D. to mark the tumor.   DESCRIPTION OF PROCEDURE:  The left breast was prepped with  Betadine  solution and sterilely draped.  I first identified, the sentinel lymph node  through an incision which was about 3 cm in length on the anterior left  axilla, cutting down on the lymph node, which palpated was about 2 cm in  maximum size. It was not blue, however, had counts of 1100 with a background  of about 10, so I thought it was her sentinel lymph node. She had no other  high counts. I also used to the Neoprobe to go to the supraclavicular fossa  and mediastinum and found no increased activity there.  Dr. Clelia Croft called the  sentinel lymph node as being negative.  I then packed the wound which I  would close at the end of the case.  I then turned my attention to the left  breast. She had a wire coming out of her breast.  The fiberoptic position of  the wire was directed laterally and somewhat superiorly.  I used the wire as  a guide in which to do the excision.  I excised a block of breast tissue  probably 5 to 6 cm in diameter.  I went down to the chest wall.  I found the  tip of the guide wire. Dr. Yolanda Bonine, by her notes, had placed the wire tip  about 4 cm beyond the primary tumor mass.  I thought I had the mass well  excised and I sent it for specimen mammogram, which confirmed the mass had  been well excised.  Also, I marked the mass with a medial clip, a praneal  clip, and a lateral clip. The wire was coming out with attached skin, so I  think they had plenty of things for orientation.  I talked to Dr. Clelia Croft and  thought with the specimen showing the tumor being within the specimen on  specimen mammogram, they could do a final evaluation for margins would be  appropriate.   I then irrigated out both wounds.  I placed four clips in the lumpectomy  site. This divided margins.  I controlled hemostasis with Bovie  electrocautery and 3-0 Vicryl popoffs.  I then closed the skin at both sites  in layers with 3-0 Vicryl popoff and 5-0 Monocryl suture.  I painted the  wounds with  tincture of Benzoin and steri-stripped them.   Both wounds looked good and I then sterilely dressed them.  It is late in  the afternoon, about 6 p.m.  Whether the patient will spend the night or not  depends really on how she wakes up.  She has a family who I have talked to  also.  Her final pathology on both the sentinel lymph node and the tumor  itself is pending at the time of dictation.  Her final sponge and needle  count were correct at the end of the case.                                               Sandria Bales. Ezzard Standing, M.D.    DHN/MEDQ  D:  06/22/2002  T:  06/23/2002  Job:  [id number]   cc:   Jeannett Senior A. Evlyn Kanner, M.D.  [address]  [place]  [place] [address]  Fax: [phone]

## 2010-11-04 ENCOUNTER — Emergency Department (INDEPENDENT_AMBULATORY_CARE_PROVIDER_SITE_OTHER): Payer: Medicare Other

## 2010-11-04 ENCOUNTER — Emergency Department (HOSPITAL_BASED_OUTPATIENT_CLINIC_OR_DEPARTMENT_OTHER)
Admission: EM | Admit: 2010-11-04 | Discharge: 2010-11-04 | Disposition: A | Discharge status: Home / Self Care | Payer: Medicare Other | Attending: Emergency Medicine | Admitting: Emergency Medicine

## 2010-11-04 ENCOUNTER — Encounter: Payer: Self-pay | Admitting: *Deleted

## 2010-11-04 DIAGNOSIS — I10 Essential (primary) hypertension: Secondary | ICD-10-CM | POA: Insufficient documentation

## 2010-11-04 DIAGNOSIS — W19XXXA Unspecified fall, initial encounter: Secondary | ICD-10-CM

## 2010-11-04 DIAGNOSIS — S298XXA Other specified injuries of thorax, initial encounter: Secondary | ICD-10-CM

## 2010-11-04 DIAGNOSIS — W1809XA Striking against other object with subsequent fall, initial encounter: Secondary | ICD-10-CM | POA: Insufficient documentation

## 2010-11-04 DIAGNOSIS — R079 Chest pain, unspecified: Secondary | ICD-10-CM | POA: Insufficient documentation

## 2010-11-04 DIAGNOSIS — R0781 Pleurodynia: Secondary | ICD-10-CM

## 2010-11-04 DIAGNOSIS — Y92009 Unspecified place in unspecified non-institutional (private) residence as the place of occurrence of the external cause: Secondary | ICD-10-CM | POA: Insufficient documentation

## 2010-11-04 HISTORY — DX: Restless legs syndrome: G25.81

## 2010-11-04 HISTORY — DX: Essential (primary) hypertension: I10

## 2010-11-04 NOTE — ED Provider Notes (Signed)
History     CSN: [id number] Arrival date & time: 11/04/2010  6:05 PM  Chief Complaint  Patient presents with  . Rib Injury   HPI Comments: Pt states that she stumbled and fell into the stove and the refrigerator  Patient is a 65 y.o. female presenting with fall. The history is provided by the patient. No language interpreter was used.  Fall The accident occurred more than 2 days ago. The fall occurred while standing. There was no blood loss. Point of impact: right chest wall. Pain location: right ribs. The pain is moderate. She was ambulatory at the scene. There was no entrapment after the fall. There was no drug use involved in the accident. There was no alcohol use involved in the accident. Associated symptoms include abdominal pain. Pertinent negatives include no visual change, no vomiting, no hematuria and no loss of consciousness.    Past Medical History  Diagnosis Date  . Hypertension   . Restless leg     Past Surgical History  Procedure Date  . Breast surgery   . Cholecystectomy   . Appendectomy   . Tonsillectomy   . Abdominal hysterectomy     History reviewed. No pertinent family history.  History  Substance Use Topics  . Smoking status: Never Smoker   . Smokeless tobacco: Not on file  . Alcohol Use: No    OB History    Grav Para Term Preterm Abortions TAB SAB Ect Mult Living                  Review of Systems  Gastrointestinal: Positive for abdominal pain. Negative for vomiting.  Genitourinary: Negative for hematuria.  Neurological: Negative for loss of consciousness.  All other systems reviewed and are negative.    Physical Exam  BP 167/76  Pulse 78  Temp(Src) 98 F (36.7 C) (Oral)  Resp 24  Ht 5\' 8"  (1.727 m)  Wt 230 lb (104.327 kg)  BMI 34.97 kg/m2  SpO2 100%  Physical Exam  Nursing note and vitals reviewed. Constitutional: She appears well-developed and well-nourished.  Eyes: Pupils are equal, round, and reactive to light.    Cardiovascular: Normal rate and regular rhythm.   Pulmonary/Chest: Effort normal and breath sounds normal.    Abdominal: Soft. There is no tenderness.  Skin: Skin is warm and dry.  Psychiatric: She has a normal mood and affect.    ED Course  Procedures  MDM No acute bony abnormality noted:pt not tender in the abdomen      Teressa Lower, NP 11/04/10 1903

## 2010-11-04 NOTE — ED Notes (Signed)
Pt states she fell back against the stove a couple nights ago. Now c/o right shoulder blade and rib pain.

## 2010-11-04 NOTE — ED Provider Notes (Signed)
Medical screening examination/treatment/procedure(s) were performed by non-physician practitioner and as supervising physician I was immediately available for consultation/collaboration.   Douglas Delo, MD 11/04/10 2016 

## 2010-12-08 LAB — CHROMOSOME ANALYSIS, BONE MARROW

## 2011-03-06 DIAGNOSIS — G4730 Sleep apnea, unspecified: Secondary | ICD-10-CM

## 2011-03-06 HISTORY — DX: Sleep apnea, unspecified: G47.30

## 2011-03-09 ENCOUNTER — Telehealth: Payer: Self-pay

## 2011-03-09 ENCOUNTER — Other Ambulatory Visit: Payer: Self-pay | Admitting: Gastroenterology

## 2011-03-09 DIAGNOSIS — K7460 Unspecified cirrhosis of liver: Secondary | ICD-10-CM

## 2011-03-09 DIAGNOSIS — R110 Nausea: Secondary | ICD-10-CM

## 2011-03-09 NOTE — Telephone Encounter (Signed)
TOLD PT. THAT DR. Darrold Span HAD PRESCRIBED ATIVAN IN THE PAST FOR NAUSEA. 0.5 MG SL/PO.  PT. IS GOING TO GIVE THIS INFORMATION TO HER GI PHYSICIAN DR. Ewing Schlein.  THIS WORKED WELL FOR HER IN THE PAST.

## 2011-03-12 ENCOUNTER — Ambulatory Visit
Admission: RE | Admit: 2011-03-12 | Discharge: 2011-03-12 | Disposition: A | Discharge status: Home / Self Care | Payer: Medicare Other | Source: Ambulatory Visit | Attending: Oncology | Admitting: Oncology

## 2011-03-12 DIAGNOSIS — Z9889 Other specified postprocedural states: Secondary | ICD-10-CM

## 2011-03-13 ENCOUNTER — Ambulatory Visit: Payer: Medicare Other

## 2011-03-15 ENCOUNTER — Ambulatory Visit
Admission: RE | Admit: 2011-03-15 | Discharge: 2011-03-15 | Disposition: A | Discharge status: Home / Self Care | Payer: Medicare Other | Source: Ambulatory Visit | Attending: Gastroenterology | Admitting: Gastroenterology

## 2011-03-15 DIAGNOSIS — K7460 Unspecified cirrhosis of liver: Secondary | ICD-10-CM

## 2011-03-15 DIAGNOSIS — R110 Nausea: Secondary | ICD-10-CM

## 2011-03-15 MED ORDER — IOHEXOL 300 MG/ML  SOLN
100.0000 mL | Freq: Once | INTRAMUSCULAR | Status: AC | PRN
  Administered 2011-03-15: 100 mL via INTRAVENOUS

## 2011-03-16 HISTORY — PX: US ECHOCARDIOGRAPHY: HXRAD669

## 2011-04-23 ENCOUNTER — Encounter (HOSPITAL_BASED_OUTPATIENT_CLINIC_OR_DEPARTMENT_OTHER): Payer: Self-pay | Admitting: *Deleted

## 2011-04-23 ENCOUNTER — Emergency Department (INDEPENDENT_AMBULATORY_CARE_PROVIDER_SITE_OTHER): Payer: Medicare Other

## 2011-04-23 ENCOUNTER — Emergency Department (HOSPITAL_BASED_OUTPATIENT_CLINIC_OR_DEPARTMENT_OTHER)
Admission: EM | Admit: 2011-04-23 | Discharge: 2011-04-23 | Disposition: A | Discharge status: Home / Self Care | Payer: Medicare Other | Attending: Emergency Medicine | Admitting: Emergency Medicine

## 2011-04-23 DIAGNOSIS — W1809XA Striking against other object with subsequent fall, initial encounter: Secondary | ICD-10-CM | POA: Insufficient documentation

## 2011-04-23 DIAGNOSIS — S52599A Other fractures of lower end of unspecified radius, initial encounter for closed fracture: Secondary | ICD-10-CM | POA: Insufficient documentation

## 2011-04-23 DIAGNOSIS — S52609A Unspecified fracture of lower end of unspecified ulna, initial encounter for closed fracture: Secondary | ICD-10-CM

## 2011-04-23 DIAGNOSIS — M25529 Pain in unspecified elbow: Secondary | ICD-10-CM

## 2011-04-23 DIAGNOSIS — S52539A Colles' fracture of unspecified radius, initial encounter for closed fracture: Secondary | ICD-10-CM

## 2011-04-23 DIAGNOSIS — W19XXXA Unspecified fall, initial encounter: Secondary | ICD-10-CM

## 2011-04-23 DIAGNOSIS — Y92009 Unspecified place in unspecified non-institutional (private) residence as the place of occurrence of the external cause: Secondary | ICD-10-CM | POA: Insufficient documentation

## 2011-04-23 DIAGNOSIS — S42302A Unspecified fracture of shaft of humerus, left arm, initial encounter for closed fracture: Secondary | ICD-10-CM

## 2011-04-23 DIAGNOSIS — S42309A Unspecified fracture of shaft of humerus, unspecified arm, initial encounter for closed fracture: Secondary | ICD-10-CM

## 2011-04-23 DIAGNOSIS — S52509A Unspecified fracture of the lower end of unspecified radius, initial encounter for closed fracture: Secondary | ICD-10-CM | POA: Insufficient documentation

## 2011-04-23 LAB — CBC
HCT: 36.1 % (ref 36.0–46.0)
Hemoglobin: 12.8 g/dL (ref 12.0–15.0)
MCHC: 35.5 g/dL (ref 30.0–36.0)
MCV: 89.1 fL (ref 78.0–100.0)
RDW: 13.7 % (ref 11.5–15.5)

## 2011-04-23 LAB — PROTIME-INR: Prothrombin Time: 16.1 seconds — ABNORMAL HIGH (ref 11.6–15.2)

## 2011-04-23 LAB — COMPREHENSIVE METABOLIC PANEL
ALT: 13 U/L (ref 0–35)
Alkaline Phosphatase: 156 U/L — ABNORMAL HIGH (ref 39–117)
BUN: 15 mg/dL (ref 6–23)
CO2: 24 mEq/L (ref 19–32)
Calcium: 9.5 mg/dL (ref 8.4–10.5)
GFR calc Af Amer: 89 mL/min — ABNORMAL LOW (ref >90)
GFR calc non Af Amer: 77 mL/min — ABNORMAL LOW (ref >90)
Glucose, Bld: 129 mg/dL — ABNORMAL HIGH (ref 70–99)
Sodium: 139 mEq/L (ref 135–145)
Total Protein: 6.5 g/dL (ref 6.0–8.3)

## 2011-04-23 LAB — APTT: aPTT: 35 seconds (ref 24–37)

## 2011-04-23 MED ORDER — HYDROMORPHONE HCL PF 1 MG/ML IJ SOLN
1.0000 mg | Freq: Once | INTRAMUSCULAR | Status: AC
  Administered 2011-04-23: 1 mg via INTRAVENOUS

## 2011-04-23 MED ORDER — HYDROMORPHONE HCL PF 1 MG/ML IJ SOLN
INTRAMUSCULAR | Status: AC
  Filled 2011-04-23: qty 1

## 2011-04-23 MED ORDER — HYDROMORPHONE HCL PF 1 MG/ML IJ SOLN
1.0000 mg | Freq: Once | INTRAMUSCULAR | Status: AC
  Administered 2011-04-23: 1 mg via INTRAMUSCULAR
  Filled 2011-04-23: qty 1

## 2011-04-23 MED ORDER — NAPROXEN 500 MG PO TABS: 500.0000 mg | ORAL_TABLET | Freq: Two times a day (BID) | ORAL | Status: DC

## 2011-04-23 MED ORDER — HYDROMORPHONE HCL PF 1 MG/ML IJ SOLN: 1.0000 mg | Freq: Once | INTRAMUSCULAR | Status: DC

## 2011-04-23 MED ORDER — OXYCODONE HCL 5 MG PO TABS: 5.0000 mg | ORAL_TABLET | ORAL | Status: AC | PRN

## 2011-04-23 MED ORDER — FAMOTIDINE 20 MG PO TABS: 20.0000 mg | ORAL_TABLET | Freq: Two times a day (BID) | ORAL | Status: DC

## 2011-04-23 NOTE — Discharge Instructions (Signed)
Your x-rays show that you have a fracture of your upper arm (humerus) and your forearm (radius).  These fractures have been discussed with the on-call orthopedic surgeon who has agreed with close follow up with Dr. Renae Fickle this week in the office.  Please call the office today to schedule this follow up.  We have placed you in a splint and a sling to help keep the fractures stable.  Return to the ER for severe or worsening pain or numbness of your hand or fingers. Please inform your family doctor of your fractures. Your platelet count today was just over 60,000. Otherwise your blood work was normal.  You may take the oxycodone tablets for pain.  Take one tablet every 4 hours as needed for severe pain. You should start taking Naprosyn as prescribed, you may find some relief with Pepcid for any stomach upset.

## 2011-04-23 NOTE — ED Notes (Signed)
I met patient in waiting room, patient did not want wheelchair use because of shoulder pain. I helped patient to bed and helped remove her coat.

## 2011-04-23 NOTE — ED Notes (Signed)
Pt got up this am to fix her an egg states that she fell asleep and fell against the door frame presents to ED with left arm pain states that it feels broken in 3 places.

## 2011-04-23 NOTE — ED Provider Notes (Signed)
History     CSN: [id number]  Arrival date & time 04/23/11  0139   First MD Initiated Contact with Patient 04/23/11 0145      Chief Complaint  Patient presents with  . Arm Pain    (Consider location/radiation/quality/duration/timing/severity/associated sxs/prior treatment) HPI Comments: Pt was at home, awoke and went to cook a meal, felt herself falling backwards and then fell on her L arm on the door jam, and then to the L shoulder on the ground.  She denies head injury, neck pain or loss of consciousness. She states that she has no change in her vision and no numbness or tingling to her fingers. This was acute in onset just prior to arrival, pain is constant, worse with any movement of the arm.  Earlier in the evening the patient had no complaints, no chest pain shortness of breath abdominal pain, nausea vomiting diarrhea fevers or chills. She does have a history of hepatic failure and intermittent encephalopathy for which she takes lactulose.  Patient is a 66 y.o. female presenting with arm pain. The history is provided by the patient and a friend.  Arm Pain This is a new problem. The current episode started less than 1 hour ago. The problem occurs constantly. The problem has not changed since onset.Pertinent negatives include no chest pain, no abdominal pain, no headaches and no shortness of breath. Exacerbated by: palpation and movement of the left arm. Relieved by: nothing. She has tried nothing for the symptoms.    Past Medical History  Diagnosis Date  . Hypertension   . Restless leg     Past Surgical History  Procedure Date  . Breast surgery   . Cholecystectomy   . Appendectomy   . Tonsillectomy   . Abdominal hysterectomy     History reviewed. No pertinent family history.  History  Substance Use Topics  . Smoking status: Never Smoker   . Smokeless tobacco: Not on file  . Alcohol Use: No    OB History    Grav Para Term Preterm Abortions TAB SAB Ect Mult Living                 Review of Systems  Respiratory: Negative for shortness of breath.   Cardiovascular: Negative for chest pain.  Gastrointestinal: Negative for abdominal pain.  Neurological: Negative for headaches.  All other systems reviewed and are negative.    Allergies  Penicillins; Aspirin; and Niacin and related  Home Medications   Current Outpatient Rx  Name Route Sig Dispense Refill  . BUPROPION HCL ER (SR) 150 MG PO TB12 Oral Take 150 mg by mouth 2 (two) times daily.    . FENTANYL 75 MCG/HR TD PT72 Transdermal Place 1 patch onto the skin every 3 (three) days.    [name] HYDROCODONE-ACETAMINOPHEN 5-325 MG PO TABS Oral Take 1 tablet by mouth every 6 (six) hours as needed.    [name] LISINOPRIL 10 MG PO TABS Oral Take 10 mg by mouth daily.    [name] PROMETHAZINE HCL 12.5 MG PO TABS Oral Take 12.5 mg by mouth every 6 (six) hours as needed.    [name] ZOLPIDEM TARTRATE 10 MG PO TABS Oral Take 10 mg by mouth at bedtime as needed.    [name] FAMOTIDINE 20 MG PO TABS Oral Take 1 tablet (20 mg total) by mouth 2 (two) times daily. 30 tablet 0  . FUROSEMIDE 20 MG PO TABS Oral Take 20 mg by mouth daily.      [name] GABAPENTIN 100 MG  PO CAPS Oral Take 200 mg by mouth at bedtime.      [name] LACTULOSE 10 GM/15ML PO SOLN Oral Take 45 g by mouth daily.      [name] NAPROXEN 500 MG PO TABS Oral Take 1 tablet (500 mg total) by mouth 2 (two) times daily with a meal. 30 tablet 0  . OXYCODONE HCL 5 MG PO TABS Oral Take 1 tablet (5 mg total) by mouth every 4 (four) hours as needed for pain. 30 tablet 0  . PRAMIPEXOLE DIHYDROCHLORIDE 1 MG PO TABS Oral Take 1 mg by mouth at bedtime.      . SPIRONOLACTONE 50 MG PO TABS Oral Take 50 mg by mouth daily.        BP 125/42  Pulse 79  Temp(Src) 97.6 F (36.4 C) (Oral)  Resp 21  SpO2 100%  Physical Exam  Nursing note and vitals reviewed. Constitutional: She appears well-developed and well-nourished. No distress.  HENT:  Head: Normocephalic and atraumatic.  Mouth/Throat: Oropharynx is clear  and moist. No oropharyngeal exudate.  Eyes: Conjunctivae and EOM are normal. Pupils are equal, round, and reactive to light. Right eye exhibits no discharge. Left eye exhibits no discharge. No scleral icterus.  Neck: Normal range of motion. Neck supple. No JVD present. No thyromegaly present.  Cardiovascular: Normal rate, regular rhythm, normal heart sounds and intact distal pulses.  Exam reveals no gallop and no friction rub.   No murmur heard. Pulmonary/Chest: Effort normal and breath sounds normal. No respiratory distress. She has no wheezes. She has no rales.  Abdominal: Soft. Bowel sounds are normal. She exhibits no distension and no mass. There is no tenderness.  Musculoskeletal: Normal range of motion. She exhibits tenderness (tender to palpation and with range of motion of the left shoulder, elbow, forearm, wrist and hand). She exhibits no edema.       No obvious deformity of the left upper extremity  Lymphadenopathy:    She has no cervical adenopathy.  Neurological: She is alert. Coordination normal.       Normal speech, coordination, no asterixis, normal memory  Skin: Skin is warm and dry. No rash noted. No erythema.  Psychiatric: She has a normal mood and affect. Her behavior is normal.    ED Course  Procedures (including critical care time)  Labs Reviewed  CBC - Abnormal; Notable for the following:    Platelets 64 (*)    All other components within normal limits  AMMONIA - Abnormal; Notable for the following:    Ammonia <10 (*)    All other components within normal limits  COMPREHENSIVE METABOLIC PANEL - Abnormal; Notable for the following:    Potassium 3.3 (*)    Glucose, Bld 129 (*)    Albumin 3.3 (*)    Alkaline Phosphatase 156 (*)    GFR calc non Af Amer 77 (*)    GFR calc Af Amer 89 (*)    All other components within normal limits  PROTIME-INR - Abnormal; Notable for the following:    Prothrombin Time 16.1 (*)    All other components within normal limits  APTT     Dg Elbow Complete Left  04/23/2011  *RADIOLOGY REPORT*  Clinical Data: Status post fall; left elbow pain.  LEFT ELBOW - COMPLETE 3+ VIEW  Comparison: None.  Findings: The patient's fracture through the mid shaft of the left humerus demonstrate significant angulation on provided images.  No additional fractures are seen.  The visualized joint spaces are preserved.  No  significant joint effusion is identified.  The soft tissues are unremarkable in appearance.  IMPRESSION: Fracture through the midshaft of the left humerus demonstrates significant angulation on provided images; no additional fractures seen.  Original Report Authenticated By: Tonia Ghent, M.D.   Dg Forearm Left  04/23/2011  *RADIOLOGY REPORT*  Clinical Data: Status post fall, with pain at the left forearm.  LEFT FOREARM - 2 VIEW  Comparison: Left forearm radiographs performed 05/01/2009  Findings: There is a mildly comminuted fracture involving the distal radial metaphysis, with multiple fracture lines extending to the radiocarpal joint.  A mildly displaced mildly comminuted ulnar styloid fracture is also seen.  The fracture is better assessed on concurrent wrist radiographs.  Soft tissue swelling is noted about the wrist.  No additional fractures are identified.  The carpal rows appear grossly intact, and demonstrate normal alignment.  IMPRESSION:  1.  Mildly comminuted fracture involving the distal radial metaphysis, with multiple fracture lines extending to the radiocarpal joint. 2.  Mildly displaced mildly comminuted ulnar styloid fracture.  Original Report Authenticated By: Tonia Ghent, M.D.   Dg Wrist Complete Left  04/23/2011  *RADIOLOGY REPORT*  Clinical Data: Status post fall; left wrist pain.  LEFT WRIST - COMPLETE 3+ VIEW  Comparison: Left forearm radiographs performed 05/01/2009  Findings: There is a mildly comminuted fracture involving the distal radial metaphysis, with slight ulnar displacement of ulnar fragments.  Associated  intra-articular extension is noted to the radiocarpal joint.  A mildly comminuted mildly displaced ulnar styloid fracture is also seen.  No additional fractures are identified.  The carpal rows appear grossly intact, and demonstrate normal alignment.  Significant soft tissue swelling is noted about the wrist.  IMPRESSION:  1.  Mildly comminuted fracture involving the distal radial metaphysis, with slight ulnar displacement of ulnar fragments. Fracture otherwise seen in grossly anatomic alignment. 2.  Mildly comminuted mildly displaced ulnar styloid fracture seen.  Original Report Authenticated By: Tonia Ghent, M.D.   Dg Shoulder Left  04/23/2011  *RADIOLOGY REPORT*  Clinical Data: Status post fall; left shoulder pain and limited range of motion.  LEFT SHOULDER - 2+ VIEW  Comparison: None.  Findings: There is a significantly angulated and displaced fracture involving the mid shaft of the left humerus.  No additional fractures are seen.  The left humeral head is seated within the glenoid fossa.  The acromioclavicular joint is unremarkable in appearance.  No significant soft tissue abnormalities are seen.  The visualized portions of the left lung are clear.  IMPRESSION: Significantly angulated and displaced fracture involving the midshaft of the left humerus; no additional fractures seen.  Original Report Authenticated By: Tonia Ghent, M.D.   Dg Humerus Left  04/23/2011  *RADIOLOGY REPORT*  Clinical Data: Status post fall; left humeral pain.  Limited range of motion.  LEFT HUMERUS - 2+ VIEW  Comparison: None.  Findings: There is a mildly displaced and rotated fracture involving the midshaft of the left humerus.  No additional fractures are seen.  The left humeral head remains seated at the glenoid fossa.  No significant soft tissue abnormalities are characterized on radiograph.  Clips are noted overlying the left axilla.  IMPRESSION: Mildly displaced and rotated fracture involving the midshaft of the left  humerus.  Original Report Authenticated By: Tonia Ghent, M.D.     1. Closed fracture of left humerus   2. Distal radial fracture       MDM  Patient states that she does have thrombocytopenia from her liver failure, will evaluate for  ammonia level given history of liver failure and fall, comprehensive metabolic panel, CBC to evaluate platelets, imaging of the arm. Intramuscular hydromorphone given  Radiology Interpretation:  I have personally reviewed the x-rays and agree with the radiologist reports that there is a somewhat displaced, mild to moderately angulated mid-humeral fracture on the left. There is a comminuted minimally displaced fracture of the distal radius on the left. There is a small ulnar styloid fracture on the left.  Interpretation of results: Ammonia less than 10, electrolytes overall unremarkable, liver function unremarkable, coagulation studies normal, blood counts normal, platelet count 64,000. This is similar to prior platelet count.  Patient's pain was treated with several doses of hydromorphone intravenously. This has improved her symptoms significantly and she tolerated the splint placement prior to discharge. Rice therapy initiated, I have discussed the radiologic findings with the on-call orthopedist Dr. Marissa Nestle who agrees with close followup in the office. Patient informed of results and will proceed.  Discharge Prescriptions include:  #1 Naprosyn #2 oxycodone       Vida Roller, MD 04/23/11 [phone]

## 2011-04-23 NOTE — ED Notes (Signed)
I notified nurse of low BP reading, I changed from a regular size cuff to a large, second BP reading was 96/38. I notified nurse of same.

## 2011-04-23 NOTE — ED Notes (Signed)
I applied a sugar-tong splint to patient's left arm. I used Product manager, first padded with cotton webril over sock material. I then wrapped kerlix to hold all inplace. I then wrapped medium ace bandages to complete sugar tong. I then applied and adjusted shoulder sling per Dr. Rondel Baton request. Patient comfort has improved and patient was comfortable in chair sitting upright.

## 2011-04-23 NOTE — ED Notes (Signed)
I placed a call to [facility] for consult per Dr. Laqueta Carina call was returned by Dr. Yevette Edwards

## 2011-05-31 ENCOUNTER — Ambulatory Visit (HOSPITAL_COMMUNITY)
Admission: RE | Admit: 2011-05-31 | Discharge: 2011-05-31 | Disposition: A | Discharge status: Home / Self Care | Payer: Medicare Other | Source: Ambulatory Visit | Attending: Family Medicine | Admitting: Family Medicine

## 2011-05-31 ENCOUNTER — Other Ambulatory Visit: Payer: Self-pay | Admitting: Orthopedic Surgery

## 2011-05-31 ENCOUNTER — Encounter (HOSPITAL_COMMUNITY): Payer: Medicare Other

## 2011-05-31 ENCOUNTER — Other Ambulatory Visit: Payer: Self-pay | Admitting: Family Medicine

## 2011-05-31 DIAGNOSIS — M7989 Other specified soft tissue disorders: Secondary | ICD-10-CM | POA: Insufficient documentation

## 2011-05-31 DIAGNOSIS — R609 Edema, unspecified: Secondary | ICD-10-CM

## 2011-05-31 NOTE — Progress Notes (Signed)
*  PRELIMINARY RESULTS* Vascular Ultrasound Left upper extremity venous duplex has been completed.  Preliminary findings: Left= No evidence of superficial or deep thrombus.  Farrel Demark RDMS 05/31/2011, 7:42 PM

## 2011-06-11 ENCOUNTER — Encounter (HOSPITAL_COMMUNITY): Payer: Self-pay | Admitting: Pharmacy Technician

## 2011-06-11 ENCOUNTER — Other Ambulatory Visit: Payer: Self-pay | Admitting: Orthopedic Surgery

## 2011-06-14 ENCOUNTER — Encounter (HOSPITAL_COMMUNITY)
Admission: RE | Admit: 2011-06-14 | Discharge: 2011-06-14 | Disposition: A | Discharge status: Home / Self Care | Payer: Medicare Other | Source: Ambulatory Visit | Attending: Orthopedic Surgery | Admitting: Orthopedic Surgery

## 2011-06-14 ENCOUNTER — Encounter (HOSPITAL_COMMUNITY): Payer: Self-pay

## 2011-06-14 HISTORY — DX: Headache: R51

## 2011-06-14 HISTORY — DX: Anxiety disorder, unspecified: F41.9

## 2011-06-14 HISTORY — DX: Personal history of other diseases of the digestive system: Z87.19

## 2011-06-14 HISTORY — DX: Shortness of breath: R06.02

## 2011-06-14 HISTORY — DX: Depression, unspecified: F32.A

## 2011-06-14 HISTORY — DX: Unspecified cirrhosis of liver: K74.60

## 2011-06-14 HISTORY — DX: Malignant (primary) neoplasm, unspecified: C80.1

## 2011-06-14 HISTORY — DX: Disease of blood and blood-forming organs, unspecified: D75.9

## 2011-06-14 HISTORY — DX: Chronic kidney disease, unspecified: N18.9

## 2011-06-14 HISTORY — DX: Sleep apnea, unspecified: G47.30

## 2011-06-14 HISTORY — DX: Cardiac murmur, unspecified: R01.1

## 2011-06-14 HISTORY — DX: Other complications of anesthesia, initial encounter: T88.59XA

## 2011-06-14 HISTORY — DX: Major depressive disorder, single episode, unspecified: F32.9

## 2011-06-14 HISTORY — DX: Adverse effect of unspecified anesthetic, initial encounter: T41.45XA

## 2011-06-14 LAB — COMPREHENSIVE METABOLIC PANEL
ALT: 20 U/L (ref 0–35)
Calcium: 9.5 mg/dL (ref 8.4–10.5)
Creatinine, Ser: 0.65 mg/dL (ref 0.50–1.10)
GFR calc Af Amer: >90 mL/min (ref >90)
GFR calc non Af Amer: >90 mL/min (ref >90)
Glucose, Bld: 96 mg/dL (ref 70–99)
Sodium: 140 mEq/L (ref 135–145)
Total Protein: 6.7 g/dL (ref 6.0–8.3)

## 2011-06-14 LAB — DIFFERENTIAL
Basophils Absolute: 0 10*3/uL (ref 0.0–0.1)
Eosinophils Relative: 0 % (ref 0–5)
Lymphocytes Relative: 29 % (ref 12–46)
Lymphs Abs: 1 10*3/uL (ref 0.7–4.0)
Neutro Abs: 2.2 10*3/uL (ref 1.7–7.7)

## 2011-06-14 LAB — CBC
Hemoglobin: 13.1 g/dL (ref 12.0–15.0)
MCH: 32.4 pg (ref 26.0–34.0)
MCHC: 35.1 g/dL (ref 30.0–36.0)
MCV: 92.3 fL (ref 78.0–100.0)
Platelets: 63 10*3/uL — ABNORMAL LOW (ref 150–400)

## 2011-06-14 LAB — ABO/RH: ABO/RH(D): B POS

## 2011-06-14 LAB — URINALYSIS, ROUTINE W REFLEX MICROSCOPIC
Bilirubin Urine: NEGATIVE
Hgb urine dipstick: NEGATIVE
Ketones, ur: NEGATIVE mg/dL
Nitrite: NEGATIVE
Protein, ur: NEGATIVE mg/dL
Urobilinogen, UA: 0.2 mg/dL (ref 0.0–1.0)

## 2011-06-14 LAB — PROTIME-INR
INR: 1.12 (ref 0.00–1.49)
Prothrombin Time: 14.6 seconds (ref 11.6–15.2)

## 2011-06-14 NOTE — Pre-Procedure Instructions (Signed)
20 [name]  06/14/2011   Your procedure is scheduled on: 06/19/2011  Report to [facility] at [phone] AM.  Call this number if you have problems the morning of surgery: [phone]   Remember:   Do not eat food:After Midnight.  May have clear liquids: up to 4 Hours before arrival.do not drink liquid after 0421 am the day of surgery.   Clear liquids include soda, tea, black coffee, apple or grape juice, broth.  Take these medicines the morning of surgery with A SIP OF WATER: wellbutrin  norco  mirapex   Do not wear jewelry, make-up or nail polish.  Do not wear lotions, powders, or perfumes. You may wear deodorant.  Do not shave 48 hours prior to surgery.  Do not bring valuables to the hospital.  Contacts, dentures or bridgework may not be worn into surgery.  Leave suitcase in the car. After surgery it may be brought to your room.  For patients admitted to the hospital, checkout time is 11:00 AM the day of discharge.   Patients discharged the day of surgery will not be allowed to drive home.  Name and phone number of your driver: Jennifer-daughter [phone] Fickle son [phone]  Special Instructions: CHG Shower Use Special Wash: 1/2 bottle night before surgery and 1/2 bottle morning of surgery.   Please read over the following fact sheets that you were given: Pain Booklet, Coughing and Deep Breathing, Blood Transfusion Information, MRSA Information and Surgical Site Infection Prevention

## 2011-06-15 ENCOUNTER — Other Ambulatory Visit: Payer: Self-pay | Admitting: Orthopedic Surgery

## 2011-06-15 NOTE — Progress Notes (Signed)
Fax sent to Dr. Joellyn Rued ..requesting a sleep study  For this patient.

## 2011-06-18 MED ORDER — VANCOMYCIN HCL IN DEXTROSE 1-5 GM/200ML-% IV SOLN: 1000.0000 mg | INTRAVENOUS | Status: DC

## 2011-06-18 MED ORDER — POVIDONE-IODINE 7.5 % EX SOLN
Freq: Once | CUTANEOUS | Status: DC
  Filled 2011-06-18: qty 118

## 2011-06-18 NOTE — Progress Notes (Signed)
Left msg. For Pulm. Office, at [place], ph: [phone], to send pulm. Note & sleep study.

## 2011-06-18 NOTE — Progress Notes (Signed)
Call to [facility] spoke with Foye Clock, requested records, pulm. Note & sleep study.  Refaxed request as well.

## 2011-06-19 ENCOUNTER — Ambulatory Visit (HOSPITAL_COMMUNITY): Payer: Medicare Other

## 2011-06-19 ENCOUNTER — Encounter (HOSPITAL_COMMUNITY)
Admission: RE | Disposition: A | Discharge status: Home Health | Payer: Self-pay | Source: Ambulatory Visit | Attending: Orthopedic Surgery

## 2011-06-19 ENCOUNTER — Inpatient Hospital Stay (HOSPITAL_COMMUNITY)
Admission: RE | Admit: 2011-06-19 | Discharge: 2011-06-21 | DRG: 494 | Disposition: A | Discharge status: Home Health | Payer: Medicare Other | Source: Ambulatory Visit | Attending: Orthopedic Surgery | Admitting: Orthopedic Surgery

## 2011-06-19 ENCOUNTER — Encounter (HOSPITAL_COMMUNITY): Payer: Self-pay | Admitting: Anesthesiology

## 2011-06-19 ENCOUNTER — Ambulatory Visit (HOSPITAL_COMMUNITY): Payer: Medicare Other | Admitting: Anesthesiology

## 2011-06-19 ENCOUNTER — Encounter (HOSPITAL_COMMUNITY): Payer: Self-pay | Admitting: *Deleted

## 2011-06-19 DIAGNOSIS — G4730 Sleep apnea, unspecified: Secondary | ICD-10-CM | POA: Diagnosis present

## 2011-06-19 DIAGNOSIS — F341 Dysthymic disorder: Secondary | ICD-10-CM | POA: Diagnosis present

## 2011-06-19 DIAGNOSIS — E119 Type 2 diabetes mellitus without complications: Secondary | ICD-10-CM | POA: Diagnosis present

## 2011-06-19 DIAGNOSIS — Z79899 Other long term (current) drug therapy: Secondary | ICD-10-CM

## 2011-06-19 DIAGNOSIS — D696 Thrombocytopenia, unspecified: Secondary | ICD-10-CM | POA: Diagnosis present

## 2011-06-19 DIAGNOSIS — E669 Obesity, unspecified: Secondary | ICD-10-CM | POA: Diagnosis present

## 2011-06-19 DIAGNOSIS — Z01810 Encounter for preprocedural cardiovascular examination: Secondary | ICD-10-CM

## 2011-06-19 DIAGNOSIS — G2581 Restless legs syndrome: Secondary | ICD-10-CM | POA: Diagnosis present

## 2011-06-19 DIAGNOSIS — Z01812 Encounter for preprocedural laboratory examination: Secondary | ICD-10-CM

## 2011-06-19 DIAGNOSIS — I129 Hypertensive chronic kidney disease with stage 1 through stage 4 chronic kidney disease, or unspecified chronic kidney disease: Secondary | ICD-10-CM | POA: Diagnosis present

## 2011-06-19 DIAGNOSIS — K219 Gastro-esophageal reflux disease without esophagitis: Secondary | ICD-10-CM | POA: Diagnosis present

## 2011-06-19 DIAGNOSIS — S42309A Unspecified fracture of shaft of humerus, unspecified arm, initial encounter for closed fracture: Secondary | ICD-10-CM

## 2011-06-19 DIAGNOSIS — N189 Chronic kidney disease, unspecified: Secondary | ICD-10-CM | POA: Diagnosis present

## 2011-06-19 DIAGNOSIS — K7460 Unspecified cirrhosis of liver: Secondary | ICD-10-CM | POA: Diagnosis present

## 2011-06-19 DIAGNOSIS — Z87891 Personal history of nicotine dependence: Secondary | ICD-10-CM

## 2011-06-19 DIAGNOSIS — Z01818 Encounter for other preprocedural examination: Secondary | ICD-10-CM

## 2011-06-19 DIAGNOSIS — X58XXXA Exposure to other specified factors, initial encounter: Secondary | ICD-10-CM | POA: Diagnosis present

## 2011-06-19 DIAGNOSIS — K449 Diaphragmatic hernia without obstruction or gangrene: Secondary | ICD-10-CM | POA: Diagnosis present

## 2011-06-19 HISTORY — PX: ORIF HUMERUS FRACTURE: SHX2126

## 2011-06-19 LAB — CBC
Platelets: 48 10*3/uL — ABNORMAL LOW (ref 150–400)
RBC: 3.25 MIL/uL — ABNORMAL LOW (ref 3.87–5.11)
RDW: 14.4 % (ref 11.5–15.5)
WBC: 4.1 10*3/uL (ref 4.0–10.5)

## 2011-06-19 LAB — GLUCOSE, CAPILLARY
Glucose-Capillary: 138 mg/dL — ABNORMAL HIGH (ref 70–99)
Glucose-Capillary: 130 mg/dL — ABNORMAL HIGH (ref 70–99)
Glucose-Capillary: 106 mg/dL — ABNORMAL HIGH (ref 70–99)

## 2011-06-19 SURGERY — OPEN REDUCTION INTERNAL FIXATION (ORIF) DISTAL HUMERUS FRACTURE
Anesthesia: General | Site: Arm Upper | Laterality: Left | Wound class: Clean

## 2011-06-19 MED ORDER — ONDANSETRON HCL 4 MG PO TABS
4.0000 mg | ORAL_TABLET | Freq: Four times a day (QID) | ORAL | Status: DC | PRN
  Filled 2011-06-19: qty 1

## 2011-06-19 MED ORDER — LISINOPRIL 10 MG PO TABS
10.0000 mg | ORAL_TABLET | Freq: Every day | ORAL | Status: DC
  Administered 2011-06-20 – 2011-06-21 (×2): 10 mg via ORAL
  Filled 2011-06-19 (×3): qty 1

## 2011-06-19 MED ORDER — SPIRONOLACTONE 50 MG PO TABS
50.0000 mg | ORAL_TABLET | Freq: Every day | ORAL | Status: DC
  Administered 2011-06-20 – 2011-06-21 (×2): 50 mg via ORAL
  Filled 2011-06-19 (×3): qty 1

## 2011-06-19 MED ORDER — BUPROPION HCL ER (SR) 150 MG PO TB12
150.0000 mg | ORAL_TABLET | Freq: Two times a day (BID) | ORAL | Status: DC
  Administered 2011-06-20 – 2011-06-21 (×3): 150 mg via ORAL
  Filled 2011-06-19 (×6): qty 1

## 2011-06-19 MED ORDER — CLINDAMYCIN PHOSPHATE 600 MG/50ML IV SOLN
600.0000 mg | Freq: Four times a day (QID) | INTRAVENOUS | Status: AC
  Administered 2011-06-19 – 2011-06-20 (×3): 600 mg via INTRAVENOUS
  Filled 2011-06-19 (×3): qty 50

## 2011-06-19 MED ORDER — FENTANYL CITRATE 0.05 MG/ML IJ SOLN
INTRAMUSCULAR | Status: DC | PRN
  Administered 2011-06-19: 50 ug via INTRAVENOUS
  Administered 2011-06-19: 100 ug via INTRAVENOUS
  Administered 2011-06-19 (×2): 50 ug via INTRAVENOUS

## 2011-06-19 MED ORDER — MORPHINE SULFATE 2 MG/ML IJ SOLN
2.0000 mg | INTRAMUSCULAR | Status: DC | PRN
  Administered 2011-06-19 – 2011-06-20 (×6): 2 mg via INTRAVENOUS
  Filled 2011-06-19 (×6): qty 1

## 2011-06-19 MED ORDER — MIDAZOLAM HCL 2 MG/2ML IJ SOLN
1.0000 mg | INTRAMUSCULAR | Status: DC | PRN
  Administered 2011-06-19: 2 mg via INTRAVENOUS

## 2011-06-19 MED ORDER — LACTATED RINGERS IV SOLN
INTRAVENOUS | Status: DC
  Administered 2011-06-19: 10:00:00 via INTRAVENOUS

## 2011-06-19 MED ORDER — LACTULOSE 10 GM/15ML PO SOLN
45.0000 g | Freq: Every day | ORAL | Status: DC
  Administered 2011-06-20: 45 g via ORAL
  Filled 2011-06-19 (×3): qty 90

## 2011-06-19 MED ORDER — FENTANYL 50 MCG/HR TD PT72
75.0000 ug | MEDICATED_PATCH | TRANSDERMAL | Status: DC
  Administered 2011-06-19: 75 ug via TRANSDERMAL
  Filled 2011-06-19: qty 1

## 2011-06-19 MED ORDER — FENTANYL CITRATE 0.05 MG/ML IJ SOLN
INTRAMUSCULAR | Status: AC
  Filled 2011-06-19: qty 2

## 2011-06-19 MED ORDER — FUROSEMIDE 20 MG PO TABS
20.0000 mg | ORAL_TABLET | ORAL | Status: DC
  Filled 2011-06-19 (×2): qty 1

## 2011-06-19 MED ORDER — DIPHENHYDRAMINE HCL 12.5 MG/5ML PO ELIX: 12.5000 mg | ORAL_SOLUTION | ORAL | Status: DC | PRN

## 2011-06-19 MED ORDER — OXYCODONE-ACETAMINOPHEN 5-325 MG PO TABS
1.0000 | ORAL_TABLET | ORAL | Status: DC | PRN
  Administered 2011-06-19 – 2011-06-21 (×3): 2 via ORAL
  Filled 2011-06-19 (×3): qty 2

## 2011-06-19 MED ORDER — PRAMIPEXOLE DIHYDROCHLORIDE 1 MG PO TABS
1.0000 mg | ORAL_TABLET | Freq: Every day | ORAL | Status: DC
  Administered 2011-06-19 – 2011-06-20 (×2): 1 mg via ORAL
  Filled 2011-06-19 (×4): qty 1

## 2011-06-19 MED ORDER — PHENYLEPHRINE HCL 10 MG/ML IJ SOLN
INTRAMUSCULAR | Status: DC | PRN
  Administered 2011-06-19 (×4): 40 ug via INTRAVENOUS

## 2011-06-19 MED ORDER — GLYCOPYRROLATE 0.2 MG/ML IJ SOLN
INTRAMUSCULAR | Status: DC | PRN
  Administered 2011-06-19: .6 mg via INTRAVENOUS

## 2011-06-19 MED ORDER — METHOCARBAMOL 500 MG PO TABS
500.0000 mg | ORAL_TABLET | Freq: Four times a day (QID) | ORAL | Status: DC | PRN
  Administered 2011-06-19 – 2011-06-21 (×4): 500 mg via ORAL
  Filled 2011-06-19 (×5): qty 1

## 2011-06-19 MED ORDER — LACTATED RINGERS IV SOLN
INTRAVENOUS | Status: DC | PRN
  Administered 2011-06-19 (×2): via INTRAVENOUS

## 2011-06-19 MED ORDER — FENTANYL CITRATE 0.05 MG/ML IJ SOLN
50.0000 ug | INTRAMUSCULAR | Status: DC | PRN
  Administered 2011-06-19: 100 ug via INTRAVENOUS

## 2011-06-19 MED ORDER — METOCLOPRAMIDE HCL 5 MG/ML IJ SOLN
5.0000 mg | Freq: Three times a day (TID) | INTRAMUSCULAR | Status: DC | PRN
  Filled 2011-06-19: qty 2

## 2011-06-19 MED ORDER — GABAPENTIN 100 MG PO CAPS
200.0000 mg | ORAL_CAPSULE | Freq: Every day | ORAL | Status: DC
  Administered 2011-06-19 – 2011-06-20 (×2): 200 mg via ORAL
  Filled 2011-06-19 (×4): qty 2

## 2011-06-19 MED ORDER — ALBUMIN HUMAN 5 % IV SOLN
INTRAVENOUS | Status: DC | PRN
  Administered 2011-06-19 (×2): via INTRAVENOUS

## 2011-06-19 MED ORDER — SODIUM CHLORIDE 0.9 % IV SOLN
1500.0000 mg | INTRAVENOUS | Status: AC
  Administered 2011-06-19: 1500 mg via INTRAVENOUS
  Filled 2011-06-19: qty 1500

## 2011-06-19 MED ORDER — PROPOFOL 10 MG/ML IV EMUL
INTRAVENOUS | Status: DC | PRN
  Administered 2011-06-19: 200 mg via INTRAVENOUS
  Administered 2011-06-19 (×2): 50 mg via INTRAVENOUS

## 2011-06-19 MED ORDER — ZOLPIDEM TARTRATE 5 MG PO TABS: 5.0000 mg | ORAL_TABLET | Freq: Every evening | ORAL | Status: DC | PRN

## 2011-06-19 MED ORDER — METHOCARBAMOL 100 MG/ML IJ SOLN
500.0000 mg | Freq: Four times a day (QID) | INTRAVENOUS | Status: DC | PRN
  Filled 2011-06-19: qty 5

## 2011-06-19 MED ORDER — VITAMIN D3 25 MCG (1000 UNIT) PO TABS
1000.0000 [IU] | ORAL_TABLET | Freq: Two times a day (BID) | ORAL | Status: DC
  Administered 2011-06-20 – 2011-06-21 (×3): 1000 [IU] via ORAL
  Filled 2011-06-19 (×6): qty 1

## 2011-06-19 MED ORDER — POTASSIUM CHLORIDE IN NACL 20-0.45 MEQ/L-% IV SOLN
INTRAVENOUS | Status: DC
  Administered 2011-06-20 (×2): via INTRAVENOUS
  Administered 2011-06-21: 100 mL/h via INTRAVENOUS
  Filled 2011-06-19 (×6): qty 1000

## 2011-06-19 MED ORDER — NEOSTIGMINE METHYLSULFATE 1 MG/ML IJ SOLN
INTRAMUSCULAR | Status: DC | PRN
  Administered 2011-06-19: 5 mg via INTRAVENOUS

## 2011-06-19 MED ORDER — MIDAZOLAM HCL 2 MG/2ML IJ SOLN
INTRAMUSCULAR | Status: AC
  Filled 2011-06-19: qty 2

## 2011-06-19 MED ORDER — DOCUSATE SODIUM 100 MG PO CAPS
100.0000 mg | ORAL_CAPSULE | Freq: Two times a day (BID) | ORAL | Status: DC
  Administered 2011-06-20 – 2011-06-21 (×2): 100 mg via ORAL
  Filled 2011-06-19 (×6): qty 1

## 2011-06-19 MED ORDER — METOCLOPRAMIDE HCL 5 MG PO TABS
5.0000 mg | ORAL_TABLET | Freq: Three times a day (TID) | ORAL | Status: DC | PRN
  Filled 2011-06-19: qty 2

## 2011-06-19 MED ORDER — ONDANSETRON HCL 4 MG/2ML IJ SOLN: 4.0000 mg | Freq: Once | INTRAMUSCULAR | Status: DC | PRN

## 2011-06-19 MED ORDER — SODIUM CHLORIDE 0.9 % IV SOLN
INTRAVENOUS | Status: DC | PRN
  Administered 2011-06-19: 11:00:00 via INTRAVENOUS

## 2011-06-19 MED ORDER — ONDANSETRON HCL 4 MG/2ML IJ SOLN
4.0000 mg | Freq: Four times a day (QID) | INTRAMUSCULAR | Status: DC | PRN
  Filled 2011-06-19: qty 2

## 2011-06-19 MED ORDER — HYDROMORPHONE HCL PF 1 MG/ML IJ SOLN: 0.2500 mg | INTRAMUSCULAR | Status: DC | PRN

## 2011-06-19 MED ORDER — ROCURONIUM BROMIDE 100 MG/10ML IV SOLN
INTRAVENOUS | Status: DC | PRN
  Administered 2011-06-19: 50 mg via INTRAVENOUS

## 2011-06-19 MED ORDER — 0.9 % SODIUM CHLORIDE (POUR BTL) OPTIME
TOPICAL | Status: DC | PRN
  Administered 2011-06-19: 1000 mL

## 2011-06-19 SURGICAL SUPPLY — 80 items
BANDAGE ELASTIC 4 VELCRO ST LF (GAUZE/BANDAGES/DRESSINGS) ×2 IMPLANT
BANDAGE ELASTIC 6 VELCRO ST LF (GAUZE/BANDAGES/DRESSINGS) IMPLANT
BANDAGE GAUZE ELAST BULKY 4 IN (GAUZE/BANDAGES/DRESSINGS) ×2 IMPLANT
BIT DRILL CANN QC 4.3X180 (BIT) ×1 IMPLANT
BIT DRILL Q/COUPLING 1 (BIT) ×2 IMPLANT
BLADE SURG 15 STRL LF DISP TIS (BLADE) ×1 IMPLANT
BLADE SURG 15 STRL SS (BLADE)
BNDG COHESIVE 6X5 TAN STRL LF (GAUZE/BANDAGES/DRESSINGS) ×2 IMPLANT
CHLORAPREP W/TINT 26ML (MISCELLANEOUS) ×3 IMPLANT
CLOTH BEACON ORANGE TIMEOUT ST (SAFETY) ×3 IMPLANT
COVER SURGICAL LIGHT HANDLE (MISCELLANEOUS) ×3 IMPLANT
DRAPE C-ARM 42X72 X-RAY (DRAPES) ×2 IMPLANT
DRAPE INCISE IOBAN 66X45 STRL (DRAPES) ×3 IMPLANT
DRAPE SURG 17X23 STRL (DRAPES) ×1 IMPLANT
DRAPE U-SHAPE 47X51 STRL (DRAPES) ×3 IMPLANT
DRILL BIT 4.3MM (BIT) ×3
DRSG ADAPTIC 3X8 NADH LF (GAUZE/BANDAGES/DRESSINGS) ×3 IMPLANT
DRSG PAD ABDOMINAL 8X10 ST (GAUZE/BANDAGES/DRESSINGS) ×4 IMPLANT
ELECT BLADE 4.0 EZ CLEAN MEGAD (MISCELLANEOUS)
ELECT REM PT RETURN 9FT ADLT (ELECTROSURGICAL) ×3
ELECTRODE BLDE 4.0 EZ CLN MEGD (MISCELLANEOUS) ×1 IMPLANT
ELECTRODE REM PT RTRN 9FT ADLT (ELECTROSURGICAL) ×2 IMPLANT
GLOVE BIO SURGEON STRL SZ7 (GLOVE) ×2 IMPLANT
GLOVE BIO SURGEON STRL SZ7.5 (GLOVE) ×1 IMPLANT
GLOVE BIOGEL M 7.0 STRL (GLOVE) ×2 IMPLANT
GLOVE BIOGEL PI IND STRL 6.5 (GLOVE) ×1 IMPLANT
GLOVE BIOGEL PI IND STRL 7.0 (GLOVE) ×1 IMPLANT
GLOVE BIOGEL PI IND STRL 7.5 (GLOVE) ×3 IMPLANT
GLOVE BIOGEL PI IND STRL 8 (GLOVE) ×1 IMPLANT
GLOVE BIOGEL PI INDICATOR 6.5 (GLOVE) ×1
GLOVE BIOGEL PI INDICATOR 7.0 (GLOVE) ×1
GLOVE BIOGEL PI INDICATOR 7.5 (GLOVE) ×3
GLOVE BIOGEL PI INDICATOR 8 (GLOVE)
GLOVE ECLIPSE 6.5 STRL STRAW (GLOVE) ×2 IMPLANT
GLOVE SURG SS PI 7.5 STRL IVOR (GLOVE) ×8 IMPLANT
GOWN PREVENTION PLUS XLARGE (GOWN DISPOSABLE) ×5 IMPLANT
GOWN SRG XL XLNG 56XLVL 4 (GOWN DISPOSABLE) ×2 IMPLANT
GOWN STRL NON-REIN LRG LVL3 (GOWN DISPOSABLE) ×6 IMPLANT
GOWN STRL NON-REIN XL XLG LVL4 (GOWN DISPOSABLE) ×6
KIT BASIN OR (CUSTOM PROCEDURE TRAY) ×3 IMPLANT
KIT ROOM TURNOVER OR (KITS) ×3 IMPLANT
MANIFOLD NEPTUNE II (INSTRUMENTS) ×3 IMPLANT
NDL HYPO 25GX1X1/2 BEV (NEEDLE) ×1 IMPLANT
NDL MAYO TROCAR (NEEDLE) ×1 IMPLANT
NEEDLE HYPO 25GX1X1/2 BEV (NEEDLE) IMPLANT
NEEDLE MAYO TROCAR (NEEDLE) IMPLANT
NS IRRIG 1000ML POUR BTL (IV SOLUTION) ×3 IMPLANT
PACK SHOULDER (CUSTOM PROCEDURE TRAY) ×3 IMPLANT
PAD ARMBOARD 7.5X6 YLW CONV (MISCELLANEOUS) ×6 IMPLANT
PLATE LOCKING 8 HOLE (Plate) ×2 IMPLANT
SCREW CORTEX ST 4.5X24 (Screw) ×2 IMPLANT
SCREW CORTEX ST 4.5X26 (Screw) ×2 IMPLANT
SCREW CORTEX ST 4.5X28 (Screw) ×6 IMPLANT
SCREW CORTEX ST 4.5X30 (Screw) ×2 IMPLANT
SCREW LOCKING 5.0MM 26MM (Screw) ×2 IMPLANT
SCREW LOCKING 5.0MM 28MM (Screw) ×2 IMPLANT
SLING ARM IMMOBILIZER LRG (SOFTGOODS) ×3 IMPLANT
SLING ARM IMMOBILIZER MED (SOFTGOODS) IMPLANT
SPONGE GAUZE 4X4 12PLY (GAUZE/BANDAGES/DRESSINGS) ×3 IMPLANT
SPONGE LAP 18X18 X RAY DECT (DISPOSABLE) ×5 IMPLANT
SPONGE LAP 4X18 X RAY DECT (DISPOSABLE) ×3 IMPLANT
STAPLER VISISTAT 35W (STAPLE) ×2 IMPLANT
STRIP CLOSURE SKIN 1/2X4 (GAUZE/BANDAGES/DRESSINGS) ×1 IMPLANT
SUCTION FRAZIER TIP 10 FR DISP (SUCTIONS) ×3 IMPLANT
SUPPORT WRAP ARM LG (MISCELLANEOUS) ×1 IMPLANT
SUT FIBERWIRE #2 38 T-5 BLUE (SUTURE)
SUT MNCRL AB 4-0 PS2 18 (SUTURE) ×1 IMPLANT
SUT SILK 2 0 TIES 17X18 (SUTURE)
SUT SILK 2-0 18XBRD TIE BLK (SUTURE) ×1 IMPLANT
SUT VIC AB 0 CTB1 27 (SUTURE) ×2 IMPLANT
SUT VIC AB 1 CT1 27 (SUTURE)
SUT VIC AB 1 CT1 27XBRD ANTBC (SUTURE) ×1 IMPLANT
SUT VIC AB 2-0 CT1 27 (SUTURE) ×6
SUT VIC AB 2-0 CT1 TAPERPNT 27 (SUTURE) ×2 IMPLANT
SUTURE FIBERWR #2 38 T-5 BLUE (SUTURE) ×5 IMPLANT
SYR CONTROL 10ML LL (SYRINGE) ×1 IMPLANT
TOWEL OR 17X24 6PK STRL BLUE (TOWEL DISPOSABLE) ×3 IMPLANT
TOWEL OR 17X26 10 PK STRL BLUE (TOWEL DISPOSABLE) ×3 IMPLANT
WATER STERILE IRR 1000ML POUR (IV SOLUTION) ×1 IMPLANT
YANKAUER SUCT BULB TIP NO VENT (SUCTIONS) ×3 IMPLANT

## 2011-06-19 NOTE — Brief Op Note (Signed)
06/19/2011  12:30 PM  PATIENT:  [name]  65 y.o. female  PRE-OPERATIVE DIAGNOSIS:  LEFT HUMERUS FRACTURE  POST-OPERATIVE DIAGNOSIS:  LEFT HUMERUS FRACTURE  PROCEDURE:  Procedure(s) (LRB): OPEN REDUCTION INTERNAL FIXATION (ORIF) DISTAL HUMERUS FRACTURE (Left)  SURGEON:  Surgeon(s) and Role:    * Mable Paris, MD - Primary  PHYSICIAN ASSISTANT: Skip Mayer PA-C  ASSISTANTS: Jiles Harold PA-S   ANESTHESIA:   general  EBL:  Total I/O In: 1200 [I.V.:1200] Out: 500 [Blood:500]  BLOOD ADMINISTERED:none  DRAINS: none   LOCAL MEDICATIONS USED:  NONE  SPECIMEN:  No Specimen  DISPOSITION OF SPECIMEN:  N/A  COUNTS:  YES  TOURNIQUET:  * No tourniquets in log *  DICTATION: .Other Dictation: Dictation Number [id number]  PLAN OF CARE: Admit to inpatient   PATIENT DISPOSITION:  PACU - hemodynamically stable.   Delay start of Pharmacological VTE agent (>24hrs) due to surgical blood loss or risk of bleeding: yes

## 2011-06-19 NOTE — Op Note (Signed)
[name], [name] NO.:  [id number]  MEDICAL RECORD NO.:  [id number]  LOCATION:  5040                         FACILITY:  [facility]  PHYSICIAN:  Jones Broom, MD    DATE OF BIRTH:  1945/04/25  DATE OF PROCEDURE: DATE OF DISCHARGE:                              OPERATIVE REPORT   PREOPERATIVE DIAGNOSIS:  Left humeral shaft fracture.  POSTOPERATIVE DIAGNOSIS:  Left humeral shaft fracture.  PROCEDURE PERFORMED:  Open reduction and internal fixation of left humeral shaft fracture.  ATTENDING SURGEON:  Berline Lopes, M.D.  ASSISTANT:  Skip Mayer PA-C (Blair's assistance was essential for retraction during the procedure). Jiles Harold, PA-S, also assisted and was present during the case.  ANESTHESIA:  GETA.  COMPLICATIONS:  None.  DRAINS:  None.  SPECIMENS:  None.  ESTIMATED BLOOD LOSS:  500 mL.  INDICATION FOR SURGERY:  The patient is a 66 year old female who suffered a left humeral shaft fracture, which was initially treated nonsurgically with bracing.  She went on to fail bracing with significant motion at the fracture site at 6 weeks out.  She was indicated for surgical fixation to promote stability at the fracture and allow fracture healing.  She did show good biological bone formation at the fracture, but was unable to heal it.  She was known to have a low platelets and some significant medical comorbidities, but given the risks and benefits, she wished to go ahead with surgery.  OPERATIVE FINDINGS:  She had significant new-bone formation around the fracture.  The bone was used as bone grafting around the final construct.  The fracture was held in a near-anatomic alignment using an 8-hole 4.5 mm narrow locking plate with a combination of locking and nonlocking screws.  PROCEDURE:  The patient was identified in the preoperative holding area where I personally marked the operative site after verifying site, side, and procedure with  the patient.  She was taken back to the operating room where general anesthesia was induced without complication.  She received preoperative IV antibiotics.  The appropriate time-out procedure was carried out.  The left upper extremity was prepped and draped in a standard sterile fashion and lied on a radiolucent hand table.  A approximately 15 cm incision was made in the anterolateral arm centered over the fracture site.  Dissection was carried down through subcutaneous tissues to the level of the fascia.  The brachialis was identified and split longitudinally in its center point.  Dissection was carried down to the bone.  The proximal and distal segments were then meticulously exposed taking great care to get on the bone anteriorly and then subperiosteally dissect around posteriorly to avoid any injury to the radial nerve.  Each end was substantially exposed and bone ends were freshened using a rongeur to get back to native bone, removing the anterior bone which had already formed in an attempt to heal.  This was saved on the back table and later used as bone graft.  Both intramedullary canal was reestablished using rongeur and curette.  Once the short oblique fracture was fully identified and cleaned, the fracture was reduced and the proximal distal exposure was adequate to place a 8-hole 4.5  locking narrow plate.  A proximal nonlocking screw was placed to bring the plate down to the bone and then reduction forceps were used to reduce the fracture distally to the plate and a distal nonlocking screw was placed reducing the bone to the plate.  At this point, fluoroscopic imaging verified appropriate reduction of the fracture and positioning of the plate.  The remaining 6 holes were then drilled, measured, and filled with the appropriate size nonlocking and locking screws.  Final x-rays demonstrated near anatomic reduction of the fracture and appropriate positioning of the plate and  screws.  The wound was then copiously irrigated with normal saline and the bone graft, which had previously been taken, was placed around the fracture. The brachialis fascia was closed loosely with 2 sutures just to bring it over the plate and then skin was closed with 2-0 Vicryl in a deep dermal layer and staples for skin closure.  She was noted to be fairly oozed blood throughout the procedure, but there was no significant bleeding sources identified.  She was not felt to require platelet transfusion. Sterile dressings were applied including Adaptic, 4x4s, ABDs, Kerlix, and Ace bandage from the fingertips to the shoulder.  She was then allowed to awaken from general anesthesia, transferred to the stretcher, and taken to recovery room in stable condition.  POSTOPERATIVE PLAN:  She will be kept 1-2 nights in the hospital for pain control, IV antibiotics, and some therapy.     Jones Broom, MD     JC/MEDQ  D:  06/19/2011  T:  06/19/2011  Job:  [id number]

## 2011-06-19 NOTE — Anesthesia Postprocedure Evaluation (Signed)
  Anesthesia Post-op Note  Patient: [name]  Procedure(s) Performed: Procedure(s) (LRB): OPEN REDUCTION INTERNAL FIXATION (ORIF) DISTAL HUMERUS FRACTURE (Left)  Patient Location: PACU  Anesthesia Type: General and GA combined with regional for post-op pain  Level of Consciousness: awake, alert  and oriented  Airway and Oxygen Therapy: Patient Spontanous Breathing and Patient connected to nasal cannula oxygen  Post-op Pain: none  Post-op Assessment: Post-op Vital signs reviewed and Patient's Cardiovascular Status Stable  Post-op Vital Signs: stable  Complications: No apparent anesthesia complications

## 2011-06-19 NOTE — Progress Notes (Signed)
Post op check  Doing well, block still working, no pain.  Still with N/T distally  NAD Alert LUE dressing saturated through ABD, inc oozing blood  Distally barely wiggles fingers, diffuse paresthesias still, CR < 2 sec  A/P  Given continued post op oozing and plt <50k will transfuse one unit Recheck CBC in am Monitor oozing, reinforce dressing. Neuro check in AM when block has worn off.

## 2011-06-19 NOTE — Anesthesia Preprocedure Evaluation (Addendum)
Anesthesia Evaluation  Patient identified by MRN, date of birth, ID band Patient awake    Reviewed: Allergy & Precautions, H&P , NPO status , Patient's Chart, lab work & pertinent test results  Airway Mallampati: II      Dental  (+) Teeth Intact   Pulmonary shortness of breath, with exertion and at rest, sleep apnea , former smoker breath sounds clear to auscultation        Cardiovascular hypertension, Pt. on medications Rhythm:Regular Rate:Normal     Neuro/Psych Anxiety Depression    GI/Hepatic hiatal hernia, GERD-  Controlled,  Endo/Other  Diabetes mellitus-, Well Controlled, Type 2  Renal/GU      Musculoskeletal   Abdominal (+) + obese,  Abdomen: soft.    Peds  Hematology   Anesthesia Other Findings   Reproductive/Obstetrics                          Anesthesia Physical Anesthesia Plan  ASA: III  Anesthesia Plan: General   Post-op Pain Management:    Induction: Intravenous  Airway Management Planned: Oral ETT  Additional Equipment:   Intra-op Plan:   Post-operative Plan: Extubation in OR  Informed Consent: I have reviewed the patients History and Physical, chart, labs and discussed the procedure including the risks, benefits and alternatives for the proposed anesthesia with the patient or authorized representative who has indicated his/her understanding and acceptance.   Dental advisory given  Plan Discussed with:   Anesthesia Plan Comments: (Fracture L. Mid-shaft Humerus Cryptogenic cirrhosis  Thrombocytopenia Platelets 61,000 Type 2 DM glucose 130 Htn Obesity  GERD  Plan GA with supraclavicular block )      Anesthesia Quick Evaluation

## 2011-06-19 NOTE — Addendum Note (Signed)
Addendum  created 06/19/11 1355 by Kipp Brood, MD   Modules edited:Charting, Inpatient Notes

## 2011-06-19 NOTE — Addendum Note (Signed)
Addendum  created 06/19/11 1354 by Kipp Brood, MD   Modules edited:Charting, Inpatient Notes

## 2011-06-19 NOTE — Transfer of Care (Signed)
Immediate Anesthesia Transfer of Care Note  Patient: [name]  Procedure(s) Performed: Procedure(s) (LRB): OPEN REDUCTION INTERNAL FIXATION (ORIF) DISTAL HUMERUS FRACTURE (Left)  Patient Location: PACU  Anesthesia Type: General  Level of Consciousness: sedated and patient cooperative  Airway & Oxygen Therapy: Patient Spontanous Breathing and Patient connected to face mask oxygen  Post-op Assessment: Report given to PACU RN and Post -op Vital signs reviewed and stable  Post vital signs: Reviewed and stable  Complications: No apparent anesthesia complications

## 2011-06-19 NOTE — Preoperative (Signed)
Beta Blockers   Reason not to administer Beta Blockers:Not Applicable 

## 2011-06-19 NOTE — Anesthesia Procedure Notes (Addendum)
Anesthesia Regional Block:  Supraclavicular block  Pre-Anesthetic Checklist: ,, timeout performed, Correct Patient, Correct Site, Correct Laterality, Correct Procedure, Correct Position, site marked, Risks and benefits discussed,  Surgical consent,  Pre-op evaluation,  At surgeon's request and post-op pain management  Laterality: Left  Prep: chloraprep       Needles:  Injection technique: Single-shot  Needle Type: Echogenic Stimulator Needle          Additional Needles:  Procedures: ultrasound guided and nerve stimulator Supraclavicular block Narrative:  Start time: 06/19/2011 10:10 AM End time: 06/19/2011 10:20 AM  Performed by: Personally   Additional Notes: 25 cc 0.5% Bupivicaine 1:200 Epi injected without difficulty      Performed by: Cathie Olden B    Procedure Name: Intubation Date/Time: 06/19/2011 10:47 AM Performed by: Cathie Olden B Pre-anesthesia Checklist: Patient identified, Emergency Drugs available, Suction available, Patient being monitored and Timeout performed Patient Re-evaluated:Patient Re-evaluated prior to inductionOxygen Delivery Method: Circle system utilized Preoxygenation: Pre-oxygenation with 100% oxygen Intubation Type: IV induction Ventilation: Oral airway inserted - appropriate to patient size and Mask ventilation without difficulty Laryngoscope Size: Mac and 3 Grade View: Grade III Tube type: Oral Tube size: 7.5 mm Airway Equipment and Method: Stylet Placement Confirmation: positive ETCO2 and breath sounds checked- equal and bilateral (anterior larynx ) Secured at: 23 cm Tube secured with: Tape Dental Injury: Teeth and Oropharynx as per pre-operative assessment

## 2011-06-19 NOTE — H&P (Signed)
[name] is an 66 y.o. female.   Chief Complaint: L arm pain HPI: 66 yo F ~7 wks from L humerus fracture not healing apporpriately with bracing.    Past Medical History  Diagnosis Date  . Hypertension   . Restless leg   . Complication of anesthesia     early wake up "while tube is in her throat"  . Heart murmur   . Shortness of breath     with exertion   . Sleep apnea 2013    sleep study at [facility]    uses cpap  . Diabetes mellitus     no meds  . 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  platelets  low... wbc also low per pt ..   . Blood dyscrasia     low platelets ... low wbc.  ? liver  disease.   . Chronic kidney disease 50's    nephritis  . H/O hiatal hernia   . Headache   . Cancer 2005  . Anxiety   . Depression     Past Surgical History  Procedure Date  . Breast surgery   . Cholecystectomy   . Appendectomy   . Tonsillectomy   . Abdominal hysterectomy   . Cardiac catheterization 1975    normal cath  -low potassium  . Mastectomy 2005    left breast    Family History  Problem Relation Age of Onset  . Anesthesia problems Daughter    Social History:  reports that she has never smoked. She does not have any smokeless tobacco history on file. She reports that she does not drink alcohol or use illicit drugs.  Allergies:  Allergies  Allergen Reactions  . Penicillins Anaphylaxis, Itching and Swelling  . Aspirin     Has liver disease  . Niacin And Related Itching    Feels like skin is on fire  . Versed Other (See Comments)    Daughter has allergy, doctors advised to warn against using.Marland KitchenMarland KitchenPropofol is okay to use.    Medications Prior to Admission  Medication Dose Route Frequency Provider Last Rate Last Dose  . fentaNYL (SUBLIMAZE) injection 50-100 mcg  50-100 mcg Intravenous PRN Kipp Brood, MD      . lactated ringers infusion   Intravenous Continuous Kipp Brood, MD      . povidone-iodine (BETADINE) 7.5 % scrub   Topical Once Mable Paris, MD       . vancomycin (VANCOCIN) 1,500 mg in sodium chloride 0.9 % 500 mL IVPB  1,500 mg Intravenous 60 min Pre-Op Mable Paris, MD      . DISCONTD: vancomycin (VANCOCIN) IVPB 1000 mg/200 mL premix  1,000 mg Intravenous 60 min Pre-Op Mable Paris, MD       Medications Prior to Admission  Medication Sig Dispense Refill  . buPROPion (WELLBUTRIN SR) 150 MG 12 hr tablet Take 150 mg by mouth 2 (two) times daily.      . cholecalciferol (VITAMIN D) 1000 UNITS tablet Take 1,000 Units by mouth 2 (two) times daily.      . fentaNYL (DURAGESIC - DOSED MCG/HR) 75 MCG/HR Place 1 patch onto the skin every 3 (three) days.      . furosemide (LASIX) 20 MG tablet Take 20 mg by mouth every other day.       . gabapentin (NEURONTIN) 100 MG capsule Take 200 mg by mouth at bedtime.        [name] HYDROcodone-acetaminophen (NORCO) 5-325 MG per tablet Take  1-2 tablets by mouth every 6 (six) hours as needed. For pain      . lactulose (CHRONULAC) 10 GM/15ML solution Take 45 g by mouth daily.        [name] lisinopril (PRINIVIL,ZESTRIL) 10 MG tablet Take 10 mg by mouth daily.      . pramipexole (MIRAPEX) 1 MG tablet Take 1 mg by mouth at bedtime.        . promethazine (PHENERGAN) 12.5 MG tablet Take 12.5 mg by mouth every 6 (six) hours as needed. For nausea      . spironolactone (ALDACTONE) 50 MG tablet Take 50 mg by mouth daily.        [name] zolpidem (AMBIEN) 10 MG tablet Take 10 mg by mouth at bedtime as needed. For sleep        Results for orders placed during the hospital encounter of 06/19/11 (from the past 48 hour(s))  GLUCOSE, CAPILLARY     Status: Abnormal   Collection Time   06/19/11  8:32 AM      Component Value Range Comment   Glucose-Capillary 138 (*) 70 - 99 (mg/dL)    No results found.  Review of Systems  All other systems reviewed and are negative.    Blood pressure 157/77, pulse 77, temperature 97.7 F (36.5 C), temperature source Oral, resp. rate 18, SpO2 98.00%. Physical Exam  Constitutional:  She is oriented to person, place, and time. She appears well-developed and well-nourished.  HENT:  Head: Atraumatic.  Eyes: EOM are normal.  Cardiovascular: Intact distal pulses.   Respiratory: Effort normal.  Musculoskeletal:       L arm swollen, obvious movement at fracture site.  TTP.  Skin intact.  Neurological: She is alert and oriented to person, place, and time.  Skin: Skin is warm and dry.  Psychiatric: She has a normal mood and affect.     Assessment/Plan L humerus fracture Indicated for ORIF Risks / benefits of surgery discussed Consent on chart  NPO for OR Preop antibiotics   [name] [name] 06/19/2011, 9:58 AM

## 2011-06-20 ENCOUNTER — Encounter (HOSPITAL_COMMUNITY): Payer: Self-pay | Admitting: Orthopedic Surgery

## 2011-06-20 LAB — CBC
HCT: 28.1 % — ABNORMAL LOW (ref 36.0–46.0)
Hemoglobin: 9.7 g/dL — ABNORMAL LOW (ref 12.0–15.0)
MCH: 32.6 pg (ref 26.0–34.0)
MCHC: 34.5 g/dL (ref 30.0–36.0)
MCV: 94.3 fL (ref 78.0–100.0)
Platelets: 54 10*3/uL — ABNORMAL LOW (ref 150–400)
RBC: 2.98 MIL/uL — ABNORMAL LOW (ref 3.87–5.11)
RDW: 14.5 % (ref 11.5–15.5)
WBC: 2.4 10*3/uL — ABNORMAL LOW (ref 4.0–10.5)

## 2011-06-20 LAB — PREPARE PLATELET PHERESIS: Unit division: 0

## 2011-06-20 LAB — TYPE AND SCREEN
ABO/RH(D): B POS
Antibody Screen: NEGATIVE

## 2011-06-20 MED ORDER — CLINDAMYCIN PHOSPHATE 600 MG/50ML IV SOLN
600.0000 mg | Freq: Once | INTRAVENOUS | Status: AC
  Administered 2011-06-20: 600 mg via INTRAVENOUS
  Filled 2011-06-20: qty 50

## 2011-06-20 NOTE — Progress Notes (Addendum)
Occupational Therapy Evaluation Patient Details Name: [name] MRN: [id number] DOB: [birth year] Today's Date: 06/20/2011  Problem List: There is no problem list on file for this patient.   Past Medical History:  Past Medical History  Diagnosis Date  . Hypertension   . Restless leg   . Complication of anesthesia     early wake up "while tube is in her throat"  . Heart murmur   . Shortness of breath     with exertion   . Sleep apnea 2013    sleep study at [facility]    uses cpap  . Diabetes mellitus     no meds  . 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  platelets  low... wbc also low per pt ..   . Blood dyscrasia     low platelets ... low wbc.  ? liver  disease.   . Chronic kidney disease 50's    nephritis  . H/O hiatal hernia   . Headache   . Cancer 2005  . Anxiety   . Depression    Past Surgical History:  Past Surgical History  Procedure Date  . Breast surgery   . Cholecystectomy   . Appendectomy   . Tonsillectomy   . Abdominal hysterectomy   . Cardiac catheterization 1975    normal cath  -low potassium  . Mastectomy 2005    left breast    OT Assessment/Plan/Recommendation OT Assessment Clinical Impression Statement: 66 yo s/p LUE humeral ORIF. Fracture from previous fall. Pt will benefit from skilled Ot services to max indep with ADL and functinoal mbility for ADL, in addition to increasing functional use of LUE. Pt will not be able to D/C home tomorrow as she will D/C home alone. Also, I have concerns over her frequent falls and pt D/Cing home alone. Will discuss with MD. OT Recommendation/Assessment: Patient will need skilled OT in the acute care venue OT Problem List: Decreased strength;Decreased range of motion;Impaired balance (sitting and/or standing);Decreased coordination;Decreased knowledge of use of DME or AE;Decreased knowledge of precautions;Impaired UE functional use;Pain;Increased edema Barriers to Discharge: Decreased caregiver support OT Therapy  Diagnosis : Generalized weakness;Acute pain OT Plan OT Frequency: Min 2X/week OT Treatment/Interventions: Self-care/ADL training;Therapeutic exercise;Energy conservation;DME and/or AE instruction;Therapeutic activities;Patient/family education OT Recommendation Recommendations for Other Services: PT consult Follow Up Recommendations: Home health OT Equipment Recommended: 3 in 1 bedside comode (TBA) Individuals Consulted Consulted and Agree with Results and Recommendations: Patient OT Goals Acute Rehab OT Goals OT Goal Formulation: With patient Time For Goal Achievement: 2 weeks ADL Goals Pt Will Perform Grooming: with modified independence;Standing at sink ADL Goal: Grooming - Progress: Goal set today Pt Will Perform Upper Body Bathing: with modified independence;Standing at sink ADL Goal: Upper Body Bathing - Progress: Goal set today Pt Will Perform Lower Body Bathing: with modified independence;Sit to stand from chair;with adaptive equipment ADL Goal: Lower Body Bathing - Progress: Goal set today Pt Will Perform Upper Body Dressing: with modified independence;Sitting, chair;Unsupported ADL Goal: Upper Body Dressing - Progress: Goal set today Pt Will Perform Lower Body Dressing: with modified independence;Sit to stand from chair;Unsupported;with adaptive equipment ADL Goal: Lower Body Dressing - Progress: Goal set today Pt Will Transfer to Toilet: with modified independence;3-in-1;Maintaining weight bearing status ADL Goal: Toilet Transfer - Progress: Goal set today Pt Will Perform Toileting - Clothing Manipulation: with modified independence;Standing ADL Goal: Toileting - Clothing Manipulation - Progress: Goal set today Pt Will Perform Toileting - Hygiene: with modified independence;Sit to stand from  3-in-1/toilet ADL Goal: Toileting - Hygiene - Progress: Goal set today Arm Goals Additional Arm Goal #1: Pt will be indep with HEP for LUE gentle shoulder, elbow,hand ROM, edema contol  and positioning. Arm Goal: Additional Goal #1 - Progress: Goal set today Additional Arm Goal #2: Pt will be mod I at donning/doffing LUE sling. Arm Goal: Additional Goal #2 - Progress: Goal set today  OT Evaluation Precautions/Restrictions  Precautions Required Braces or Orthoses: Other Brace/Splint (sling) Restrictions Weight Bearing Restrictions: Yes LUE Weight Bearing: Partial weight bearing LUE Partial Weight Bearing Percentage or Pounds: 25% Prior Functioning Home Living Lives With: Alone Available Help at Discharge:  ([facility]) Type of Home: House Home Access: Stairs to enter Secretary/administrator of Steps: 5 Entrance Stairs-Rails: None Home Layout: One level Bathroom Shower/Tub: Walk-in shower;Door [employer] Toilet: Standard Bathroom Accessibility: Yes How Accessible: Accessible via walker Home Adaptive Equipment: Straight cane Prior Function Level of Independence: Independent Able to Take Stairs?: Yes Driving: Yes Vocation: On disability Comments: Has had @ 5 falls in the past month. This is how she broke her arm. Pt states she does not know what causes these falls. States she feels "like someone just knocks her over". She is seeing Dr. Evlyn Kanner for this.  ADL ADL Eating/Feeding: Simulated;Set up Where Assessed - Eating/Feeding: Chair Grooming: Performed;Moderate assistance Where Assessed - Grooming: Standing at sink;Supported Upper Body Bathing: Performed;Minimal assistance;Set up;Supervision/safety Where Assessed - Upper Body Bathing: Supported;Standing at sink Lower Body Bathing: Simulated;Moderate assistance Where Assessed - Lower Body Bathing: Sit to stand from bed;Supported Upper Body Dressing: Simulated;Moderate assistance Where Assessed - Upper Body Dressing: Sitting, chair;Supported Lower Body Dressing: Simulated;Moderate assistance Where Assessed - Lower Body Dressing: Sit to stand from chair;Supported Statistician: Clinical research associate Method: Proofreader: Set designer - Clothing Manipulation: Performed;Supervision/safety Where Assessed - Health visitor - Hygiene: Supervision/safety Where Assessed - Toileting Hygiene: Standing Tub/Shower Transfer: Not assessed Ambulation Related to ADLs: steady A with use of [place] (PT reports frequent falls) ADL Comments: overall Mod A with UB ADL Vision/Perception  Vision - History Baseline Vision: No visual deficits Cognition Cognition Arousal/Alertness: Awake/alert Overall Cognitive Status: Appears within functional limits for tasks assessed Orientation Level: Oriented X4 Cognition - Other Comments: Pt with apparent STM deficits. Reports recent sleep study where MD noted her "heart wasn't beating right".  Sensation/Coordination Sensation Light Touch: Appears Intact Proprioception: Appears Intact Coordination Gross Motor Movements are Fluid and Coordinated: No Fine Motor Movements are Fluid and Coordinated: No Coordination and Movement Description: due to  Extremity Assessment RUE Assessment RUE Assessment: Within Functional Limits LUE Assessment LUE Assessment: Exceptions to WFL LUE AROM (degrees) Overall AROM Left Upper Extremity: Deficits;Due to pain (instructed to use LUE within pain toelrance) LUE edema. R MP measurement 8.0 in; L MP measurement 9.25 in Mobility  Bed Mobility Bed Mobility: Yes Supine to Sit: 5: Supervision;HOB flat;With rails Transfers Transfers: Yes Sit to Stand: 4: Min assist;With upper extremity assist;From bed;From chair/3-in-1 Stand to Sit: 4: Min assist;With upper extremity assist;To chair/3-in-1;Other (comment) (control for descent) Exercises   End of Session OT - End of Session Equipment Utilized During Treatment: Gait belt Activity Tolerance: Patient tolerated treatment well Patient left: in chair;with call bell in reach;with  family/visitor present Nurse Communication: Other (comment) (concerns over reported falls and need to report to MD) General Behavior During Session: [facility] for tasks performed Cognition: [facility] for tasks performed (apparent memory deficits)   Syble Picco,HILLARY 06/20/2011, 11:44 AM  Luisa Dago,  OTR/L  [phone] 06/20/2011

## 2011-06-20 NOTE — Progress Notes (Signed)
PATIENT ID: [name]  MRN: [id number]  DOB/AGE:  1945-03-13 / 66 y.o.  1 Day Post-Op Procedure(s) (LRB): OPEN REDUCTION INTERNAL FIXATION (ORIF) DISTAL HUMERUS FRACTURE (Left)  Subjective: Pain is moderate.  No c/o chest pain or SOB.   Block wore off overnight.  Fairly well controlled at this point.  Oozing stopped after platelets.   Objective: Vital signs in last 24 hours: Temp:  [97.7 F (36.5 C)-100 F (37.8 C)] 98.3 F (36.8 C) (04/16 2130) Pulse Rate:  [70-97] 76  (04/16 2130) Resp:  [12-18] 16  (04/16 2130) BP: (111-160)/(61-77) 111/69 mmHg (04/16 2130) SpO2:  [98 %-100 %] 99 % (04/16 2130) Weight:  [104.917 kg (231 lb 4.8 oz)] 104.917 kg (231 lb 4.8 oz) (04/16 1530)  Intake/Output from previous day: 04/16 0701 - 04/17 0700 In: 2650 [I.V.:2150; IV Piggyback:500] Out: 500 [Blood:500] Intake/Output this shift:     Basename 06/19/11 1430  HGB 10.5*    Basename 06/19/11 1430  WBC 4.1  RBC 3.25*  HCT 30.0*  PLT 48*   No results found for this basename: NA:2,K:2,CL:2,CO2:2,BUN:2,CREATININE:2,GLUCOSE:2,CALCIUM:2 in the last 72 hours No results found for this basename: LABPT:2,INR:2 in the last 72 hours  Physical Exam: Neurovascular intact Sensation intact distally Incision: Dry blood on dressing, incision not oozing. NSTLT, motor function intact M/U/R.  Assessment/Plan: 1 Day Post-Op Procedure(s) (LRB): OPEN REDUCTION INTERNAL FIXATION (ORIF) DISTAL HUMERUS FRACTURE (Left)   Up with therapy Plan for discharge tomorrow Partial Weight Bearing @ 25% (PWB)  VTE prophylaxis: intermittent pneumatic compression boots Plan d/c tomorrow if wound looks good. Will change dressing prior to d/c. Will need home therapy.   Mable Paris 06/20/2011, 6:12 AM

## 2011-06-20 NOTE — Progress Notes (Signed)
Occupational Therapy Treatment Patient Details Name: [name] MRN: [id number] DOB: [birth year] Today's Date: 06/20/2011  OT Assessment/Plan OT Assessment/Plan Comments on Treatment Session: Improved performance this pm. Able to achieve @ 45 should flx, 45 ER, 30 abd,40 ROM elbow flexion/ext, limited composite digit flexion. tips of fingers @ 1 inch from distal palmer crease. Unable to oppose thumb to little finger.Feel that pt is making excellent progress with ADL, however, concerned over D/Cing home with recurrent recent falls. Discussed this with Dr. Veda Canning PA student and nurse. Pt has c/o episodes where she just falls but doesn't know what has happened. Would benefit from [facility] and HHaid. Pt states that she is not comfortable D/C ing home Thursday and would prefer to D/C home Friday due to living alone. Feel this is best for pt. OT Plan: Discharge plan remains appropriate OT Frequency: Min 2X/week OT Goals Acute Rehab OT Goals OT Goal Formulation: With patient Time For Goal Achievement: 2 weeks ADL Goals Pt Will Perform Grooming: with modified independence;Standing at sink ADL Goal: Grooming - Progress: Progressing toward goals Pt Will Perform Upper Body Bathing: with modified independence;Standing at sink ADL Goal: Upper Body Bathing - Progress: Progressing toward goals Pt Will Perform Lower Body Bathing: with modified independence;Sit to stand from chair;with adaptive equipment ADL Goal: Lower Body Bathing - Progress: Progressing toward goals Pt Will Perform Upper Body Dressing: with modified independence;Sitting, chair;Unsupported ADL Goal: Upper Body Dressing - Progress: Progressing toward goals Pt Will Perform Lower Body Dressing: with modified independence;Sit to stand from chair;Unsupported;with adaptive equipment ADL Goal: Lower Body Dressing - Progress: Progressing toward goals Pt Will Transfer to Toilet: with modified independence;3-in-1;Maintaining weight bearing  status ADL Goal: Toilet Transfer - Progress: Progressing toward goals Pt Will Perform Toileting - Clothing Manipulation: with modified independence;Standing ADL Goal: Toileting - Clothing Manipulation - Progress: Progressing toward goals Pt Will Perform Toileting - Hygiene: with modified independence;Sit to stand from 3-in-1/toilet ADL Goal: Toileting - Hygiene - Progress: Progressing toward goals Arm Goals Additional Arm Goal #1: Pt will be indep with HEP for LUE gentle shoulder, elbow,hand ROM, edema contol and positioning. Arm Goal: Additional Goal #1 - Progress: Progressing toward goals (given written HEP) Additional Arm Goal #2: Pt will be mod I at donning/doffing LUE sling. Arm Goal: Additional Goal #2 - Progress: Progressing toward goals  OT Treatment Precautions/Restrictions  Precautions Required Braces or Orthoses: Other Brace/Splint Other Brace/Splint:  (sling for comfrot) Restrictions Weight Bearing Restrictions: Yes LUE Weight Bearing: Partial weight bearing LUE Partial Weight Bearing Percentage or Pounds: 25%   ADL ADL Grooming: Performed;Supervision/safety Where Assessed - Grooming: Standing at sink Upper Body Bathing: Performed;Minimal assistance Lower Body Bathing: Performed;Set up;Supervision/safety Where Assessed - Lower Body Bathing: Sit to stand from chair;Unsupported Upper Body Dressing: Performed;Minimal assistance Where Assessed - Upper Body Dressing: Sitting, chair;Supported Lower Body Dressing: Performed;Set up;Supervision/safety Toilet Transfer: Research scientist (life sciences) Method: Proofreader: Set designer - Clothing Manipulation: Performed;Supervision/safety Where Assessed - Glass blower/designer Manipulation: Standing Toileting - Hygiene: Supervision/safety Where Assessed - Toileting Hygiene: Standing Tub/Shower Transfer: Not assessed Ambulation Related to ADLs: supervision ADL Comments: S to  minA Mobility  Bed Mobility Bed Mobility: Yes Supine to Sit: 5: Supervision;HOB flat;With rails Transfers Transfers: Yes Sit to Stand: 5: Supervision Exercises General Exercises - Upper Extremity Shoulder Flexion: AROM;AAROM;Left;5 reps;10 reps;Supine;Seated Shoulder Extension: AROM;Left;5 reps;Supine Shoulder ABduction: AROM;Left;5 reps;Supine Shoulder ADduction: AROM;Left;5 reps;Supine Elbow Flexion: AROM;Left;5 reps;Supine Elbow Extension: AROM;Left;5 reps Wrist Flexion: AROM;Left;10 reps;Seated Wrist Extension: AROM;Left;10 reps;Seated Digit Composite Flexion:  AROM;Left;10 reps;Seated;Squeeze ball Composite Extension: AROM;Left;10 reps;Seated  End of Session OT - End of Session Equipment Utilized During Treatment: Gait belt Activity Tolerance: Patient tolerated treatment well Patient left: in bed;with call bell in reach;with family/visitor present Nurse Communication: Other (comment) General Behavior During Session: [facility] for tasks performed Cognition: [facility] for tasks performed  [facility]  06/20/2011, 5:57 PM [facility], OTR/L  [phone] 06/20/2011

## 2011-06-21 MED ORDER — OXYCODONE-ACETAMINOPHEN 5-325 MG PO TABS
1.0000 | ORAL_TABLET | ORAL | Status: AC | PRN
Start: 2011-06-21 — End: 2011-07-01

## 2011-06-21 NOTE — Progress Notes (Signed)
CARE MANAGEMENT NOTE 06/21/2011  Patient:  [name], [name]   Account Number:  [id number]  Date Initiated:  06/21/2011  Documentation initiated by:  Vance Peper  Subjective/Objective Assessment:   66 yr old female s/p ORIF left distal humurus fracture     Action/Plan:   Spoke with pt regarding HH needs. States she is a widow. Is left handed and requesting NA for assist with ADL's .Choice offered. Called referral to [facility] liasion.   Anticipated DC Date:  06/21/2011   Anticipated DC Plan:  HOME W HOME HEALTH SERVICES      DC Planning Services  CM consult      [facility] Choice  HOME HEALTH   Choice offered to / List presented to:  C-1 Patient   DME arranged  NA      DME agency  NA     HH arranged  HH-3 OT  HH-2 PT  HH-4 NURSE'S AIDE      HH agency  [facility]   Status of service:  Completed, signed off  Discharge Disposition:  HOME W HOME HEALTH SERVICES

## 2011-06-21 NOTE — Discharge Instructions (Signed)
Gentle passive ROM shoulder elbow wrist, no lifting. Dry dressing prn. Ok to shower POD 3  Home Health to be provided  By [facility] [phone]

## 2011-06-21 NOTE — Progress Notes (Addendum)
PT D/C'D HOME TODAY. INSTRUCTIONS REVIEWED WITH PT AND SON. EDUCATION PROVIDED. PT AND SON VERBALIZED UNDERSTANDING OF ALL INSTRUCTIONS. PT WAITED FOR 3-IN-ONE BSC TO BE DELIVERED TO ROOM. PT WAS D/C'D WITH BELONGINGS, BSC AND WAS GIVEN COPY OF INSTRUCTIONS AND SCRIPT. D/C'D VIA  WHEELCHAIR ESCORTED BY UNIT NT STAFF.

## 2011-06-21 NOTE — Progress Notes (Signed)
Occupational Therapy Treatment Patient Details Name: [name] MRN: [id number] DOB: September 14, 1945 Today's Date: 06/21/2011  OT Assessment/Plan OT Assessment/Plan Comments on Treatment Session: Pt. much improved today and currently feel pt. is safe to D/C home. Pt. will have assist from family and will have her Aunt stay overnight for the next few nights and will be able to have daily supervision/assist from multiple family members. Pt. at ~65 shoulder flexion AAROM today. OT Plan: Discharge plan remains appropriate OT Frequency: Min 2X/week Follow Up Recommendations: Home health OT Equipment Recommended: 3 in 1 bedside comode OT Goals Acute Rehab OT Goals OT Goal Formulation: With patient Time For Goal Achievement: 2 weeks ADL Goals Pt Will Perform Grooming: with modified independence;Standing at sink ADL Goal: Grooming - Progress: Partly met Pt Will Perform Upper Body Dressing: with modified independence;Sitting, chair;Unsupported ADL Goal: Upper Body Dressing - Progress: Progressing toward goals Pt Will Perform Lower Body Dressing: with modified independence;Sit to stand from chair;Unsupported;with adaptive equipment ADL Goal: Lower Body Dressing - Progress: Progressing toward goals Pt Will Transfer to Toilet: with modified independence;3-in-1;Maintaining weight bearing status ADL Goal: Toilet Transfer - Progress: Progressing toward goals Arm Goals Additional Arm Goal #1: Pt will be indep with HEP for LUE gentle shoulder, elbow,hand ROM, edema contol and positioning. Arm Goal: Additional Goal #1 - Progress: Progressing toward goals Additional Arm Goal #2: Pt will be mod I at donning/doffing LUE sling. Arm Goal: Additional Goal #2 - Progress: Progressing toward goals  OT Treatment Precautions/Restrictions  Precautions Required Braces or Orthoses: Other Brace/Splint (sling) Other Brace/Splint: Sling for comfort Restrictions Weight Bearing Restrictions: Yes LUE Weight Bearing:  Partial weight bearing LUE Partial Weight Bearing Percentage or Pounds: 25%   ADL ADL Grooming: Performed;Supervision/safety Where Assessed - Grooming: Standing at sink Upper Body Dressing: Performed;Set up Upper Body Dressing Details (indicate cue type and reason): With donning button up shirt Where Assessed - Upper Body Dressing: Sitting, chair Lower Body Dressing: Performed;Set up;Supervision/safety Lower Body Dressing Details (indicate cue type and reason): With donning undergarments and pants by crossing foot over opposite knee Where Assessed - Lower Body Dressing: Sit to stand from chair;Supported Ambulation Related to ADLs: supervision and min verbal cues for rt. UE in sling ADL Comments: Educated pt. on safety with ADLs and techniques for incorporating left UE with light tasks Mobility  Bed Mobility Bed Mobility: Yes Supine to Sit: 6: Modified independent (Device/Increase time);HOB flat Transfers Transfers: Yes Sit to Stand: 6: Modified independent (Device/Increase time) Exercises General Exercises - Upper Extremity Shoulder Flexion: AROM;AAROM;Left;5 reps;10 reps;Supine;Seated Shoulder Extension: AROM;Left;5 reps;Supine Shoulder ABduction: AROM;Left;5 reps;Supine Shoulder ADduction: AROM;Left;5 reps;Supine Elbow Flexion: AROM;Left;5 reps;Supine Elbow Extension: AROM;Left;5 reps Wrist Flexion: AROM;Left;10 reps;Seated Wrist Extension: AROM;Left;10 reps;Seated Digit Composite Flexion: AROM;Left;10 reps;Seated;Squeeze ball Composite Extension: AROM;Left;10 reps;Seated  End of Session OT - End of Session Equipment Utilized During Treatment: Gait belt Activity Tolerance: Patient tolerated treatment well Patient left: in chair;with call bell in reach Nurse Communication: Mobility status for transfers General Behavior During Session: [facility] for tasks performed Cognition: [facility] for tasks performed  Cassandria Anger, OTR/L Pager [phone]  06/21/2011, 12:39 PM

## 2011-06-21 NOTE — Discharge Summary (Signed)
Patient ID: [name] MRN: [id number] DOB/AGE: 07-Feb-1946 66 y.o.  Admit date: 06/21/2011 Discharge date: 06/21/2011  Admission Diagnoses:  L humerus fx thrombocytopenia  Discharge Diagnoses:  Same  Past Medical History  Diagnosis Date  . Hypertension   . Restless leg   . Complication of anesthesia     early wake up "while tube is in her throat"  . Heart murmur   . Shortness of breath     with exertion   . Sleep apnea 2013    sleep study at [facility]    uses cpap  . Diabetes mellitus     no meds  . 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  platelets  low... wbc also low per pt ..   . Blood dyscrasia     low platelets ... low wbc.  ? liver  disease.   . Chronic kidney disease 50's    nephritis  . H/O hiatal hernia   . Headache   . Cancer 2005  . Anxiety   . Depression     Surgeries: Procedure(s): OPEN REDUCTION INTERNAL FIXATION (ORIF) DISTAL HUMERUS FRACTURE on June 21, 2011   Consultants:    Discharged Condition: Improved  Hospital Course: STEPHANINE REAS is an 66 y.o. female who was admitted 2011-06-21 for operative treatment of L humerus fracture. Patient has severe unremitting pain that affects sleep, daily activities, and work/hobbies. After pre-op clearance the patient was taken to the operating room on June 21, 2011 and underwent  Procedure(s): OPEN REDUCTION INTERNAL FIXATION (ORIF) DISTAL HUMERUS FRACTURE.     Patient was given perioperative antibiotics: Anti-infectives     Start     Dose/Rate Route Frequency Ordered Stop   06/20/11 0945   clindamycin (CLEOCIN) IVPB 600 mg        600 mg 100 mL/hr over 30 Minutes Intravenous  Once 06/20/11 0935 06/20/11 1115   2011-06-21 1600   clindamycin (CLEOCIN) IVPB 600 mg        600 mg 100 mL/hr over 30 Minutes Intravenous Every 6 hours June 21, 2011 1537 06/20/11 0445   06/21/2011 0822   vancomycin (VANCOCIN) 1,500 mg in sodium chloride 0.9 % 500 mL IVPB        1,500 mg 250 mL/hr over 120 Minutes Intravenous 60 min pre-op  2011-06-21 0823 06/21/2011 1038   06/18/11 1428   vancomycin (VANCOCIN) IVPB 1000 mg/200 mL premix  Status:  Discontinued        1,000 mg 200 mL/hr over 60 Minutes Intravenous 60 min pre-op 06/18/11 1428 06-21-11 0823           Patient was given sequential compression devices, early ambulation, to prevent DVT.  Post op she had increased oozing and plt <50,000, and was transfused 1 unit plt.  The inc then remained dry until d/c.  She had OT and needed HHOT arranged due to homebound status. POD2 dressing changed.  Inc C/d/I.  Patient benefited maximally from hospital stay and there were no complications.    Recent vital signs: Patient Vitals for the past 24 hrs:  BP Temp Pulse Resp SpO2  06/20/11 2244 118/52 mmHg 99.1 F (37.3 C) 91  18  98 %  06/20/11 1400 124/73 mmHg 98.6 F (37 C) 71  16  98 %     Recent laboratory studies:  Basename 06/20/11 0920 06-21-2011 1430  WBC 2.4* 4.1  HGB 9.7* 10.5*  HCT 28.1* 30.0*  PLT 54* 48*  NA -- --  K -- --  CL -- --  CO2 -- --  BUN -- --  CREATININE -- --  GLUCOSE -- --  INR -- --  CALCIUM -- --     Discharge Medications:   Medication List  As of 06/21/2011  6:31 AM   STOP taking these medications         HYDROcodone-acetaminophen 5-325 MG per tablet         TAKE these medications         buPROPion 150 MG 12 hr tablet   Commonly known as: WELLBUTRIN SR   Take 150 mg by mouth 2 (two) times daily.      cholecalciferol 1000 UNITS tablet   Commonly known as: VITAMIN D   Take 1,000 Units by mouth 2 (two) times daily.      fentaNYL 75 MCG/HR   Commonly known as: DURAGESIC - dosed mcg/hr   Place 1 patch onto the skin every 3 (three) days.      furosemide 20 MG tablet   Commonly known as: LASIX   Take 20 mg by mouth every other day.      gabapentin 100 MG capsule   Commonly known as: NEURONTIN   Take 200 mg by mouth at bedtime.      lactulose 10 GM/15ML solution   Commonly known as: CHRONULAC   Take 45 g by mouth daily.        lisinopril 10 MG tablet   Commonly known as: PRINIVIL,ZESTRIL   Take 10 mg by mouth daily.      oxyCODONE-acetaminophen 5-325 MG per tablet   Commonly known as: PERCOCET   Take 1-2 tablets by mouth every 4 (four) hours as needed for pain.      pramipexole 1 MG tablet   Commonly known as: MIRAPEX   Take 1 mg by mouth at bedtime.      promethazine 12.5 MG tablet   Commonly known as: PHENERGAN   Take 12.5 mg by mouth every 6 (six) hours as needed. For nausea      spironolactone 50 MG tablet   Commonly known as: ALDACTONE   Take 50 mg by mouth daily.      zolpidem 10 MG tablet   Commonly known as: AMBIEN   Take 10 mg by mouth at bedtime as needed. For sleep            Diagnostic Studies: Dg Chest 2 View  06/14/2011  *RADIOLOGY REPORT*  Clinical Data: Preop for fixation of left humeral fracture  CHEST - 2 VIEW  Comparison: Chest x-ray of 11/04/2010  Findings: The lungs are clear.  The heart is within upper limits of normal.  No acute bony abnormality is seen.  Fracture of the mid left humeral shaft is noted on the lateral view.  IMPRESSION: No active lung disease.  Heart upper normal.  Original Report Authenticated By: Juline Patch, M.D.   Dg Humerus Left  06/19/2011  *RADIOLOGY REPORT*  Clinical Data: Internal fixation left humeral fracture  LEFT HUMERUS - 2+ VIEW  Comparison: 04/23/2011  Findings: Two intraoperative fluoroscopic images demonstrate side plate fixation of left mid humeral fracture with adjacent presumed bony callus formation.  No evidence for hardware failure.  Fracture lines remain visible.  IMPRESSION: Intraoperative imaging as above.  Original Report Authenticated By: Harrel Lemon, M.D.   Dg Humerus Left  06/19/2011  *RADIOLOGY REPORT*  Clinical Data: Postop left humerus.  LEFT HUMERUS - 2+ VIEW  Comparison: 04/23/2011  Findings: Single portable postop view of the left humerus demonstrates plate  and screw fixation across the midshaft left humeral  fracture.  There appears to be callus formation around the fracture line.  Near anatomic alignment.  IMPRESSION: Internal fixation as above.  Original Report Authenticated By: Cyndie Chime, M.D.    Disposition: 01-Home or Self Care    Follow-up Information    Follow up with Mable Paris, MD in 10 days.   Contact information:   [facility] [address], [address] [place] [address] [phone]           Signed: Mable Paris 06/21/2011, 6:31 AM

## 2011-06-21 NOTE — Plan of Care (Signed)
Problem: Diagnosis - Type of Surgery Goal: General Surgical Patient Education (See Patient Education module for education specifics) SURGERY --ORIF L ELBOW

## 2011-06-21 NOTE — Progress Notes (Signed)
Occupational Therapy Treatment Patient Details Name: [name] MRN: [id number] DOB: 21-May-1945 Today's Date: 06/21/2011  OT Assessment/Plan OT Assessment/Plan *Family present for education on pt's exercises and ADL techniques to increase pt. Safety at home and decrease caregiver burden at home. OT Plan: Discharge plan remains appropriate OT Frequency: Min 2X/week Follow Up Recommendations: Home health OT;Supervision - Intermittent Equipment Recommended: 3 in 1 bedside comode OT Goals Acute Rehab OT Goals OT Goal Formulation: With patient Time For Goal Achievement: 2 weeks ADL Goals Pt Will Perform Grooming: with modified independence;Standing at sink ADL Goal: Grooming - Progress: Met Pt Will Perform Upper Body Dressing: with modified independence;Sitting, chair;Unsupported ADL Goal: Upper Body Dressing - Progress: Progressing toward goals Pt Will Perform Lower Body Dressing: with modified independence;Sit to stand from chair;Unsupported;with adaptive equipment ADL Goal: Lower Body Dressing - Progress: Progressing toward goals Pt Will Transfer to Toilet: with modified independence;3-in-1;Maintaining weight bearing status ADL Goal: Toilet Transfer - Progress: Met Arm Goals Additional Arm Goal #1: Pt will be indep with HEP for LUE gentle shoulder, elbow,hand ROM, edema contol and positioning. Arm Goal: Additional Goal #1 - Progress: Progressing toward goals Additional Arm Goal #2: Pt will be mod I at donning/doffing LUE sling. Arm Goal: Additional Goal #2 - Progress: Progressing toward goals  OT Treatment Precautions/Restrictions  Precautions Required Braces or Orthoses: Other Brace/Splint Other Brace/Splint: Sling for comfort Restrictions Weight Bearing Restrictions: Yes LUE Weight Bearing: Partial weight bearing LUE Partial Weight Bearing Percentage or Pounds: 25%   ADL ADL Grooming: Performed;Supervision/safety Where Assessed - Grooming: Standing at sink Upper Body  Dressing: Performed;Set up Upper Body Dressing Details (indicate cue type and reason): With donning button up shirt Where Assessed - Upper Body Dressing: Sitting, chair Lower Body Dressing: Performed;Set up;Supervision/safety Lower Body Dressing Details (indicate cue type and reason): With donning undergarments and pants by crossing foot over opposite knee Where Assessed - Lower Body Dressing: Sit to stand from chair;Supported Statistician: Performed;Modified independent Toilet Transfer Method: Proofreader: Bedside commode Where Assessed - Glass blower/designer Manipulation: Standing Toileting - Hygiene: Performed;Modified independent Toileting - Hygiene Details (indicate cue type and reason): Increased time Where Assessed - Toileting Hygiene: Sit on 3-in-1 or toilet Ambulation Related to ADLs: Mod I in room ADL Comments: Pt's son and daughter in law present and education completed on showering technique and dressing to decrease caregiver burden at home. Pt's son verbalized understanding. Mobility  Bed Mobility Bed Mobility: No Supine to Sit: 6: Modified independent (Device/Increase time);HOB flat Transfers Transfers: Yes Sit to Stand: 6: Modified independent (Device/Increase time)   End of Session OT - End of Session Equipment Utilized During Treatment: Gait belt Activity Tolerance: Patient tolerated treatment well Patient left: in chair;with call bell in reach Nurse Communication: Mobility status for transfers General Behavior During Session: [facility] for tasks performed Cognition: [facility] for tasks performed  [name], OTR/L Pager [phone]  06/21/2011, 12:46 PM

## 2011-06-21 NOTE — Progress Notes (Signed)
Utilization review completed. Kerensa Nicklas, RN, BSN. 06/21/11  

## 2011-11-13 ENCOUNTER — Other Ambulatory Visit: Payer: Self-pay | Admitting: Orthopedic Surgery

## 2011-11-13 DIAGNOSIS — M898X2 Other specified disorders of bone, upper arm: Secondary | ICD-10-CM

## 2011-11-16 ENCOUNTER — Ambulatory Visit
Admission: RE | Admit: 2011-11-16 | Discharge: 2011-11-16 | Disposition: A | Discharge status: Home / Self Care | Payer: Medicare Other | Source: Ambulatory Visit | Attending: Orthopedic Surgery | Admitting: Orthopedic Surgery

## 2011-11-16 DIAGNOSIS — M898X2 Other specified disorders of bone, upper arm: Secondary | ICD-10-CM

## 2011-11-21 ENCOUNTER — Other Ambulatory Visit: Payer: Self-pay | Admitting: Orthopedic Surgery

## 2011-11-26 ENCOUNTER — Encounter (HOSPITAL_COMMUNITY): Admission: RE | Admit: 2011-11-26 | Payer: Medicare Other | Source: Ambulatory Visit

## 2011-11-27 ENCOUNTER — Encounter (HOSPITAL_COMMUNITY): Payer: Self-pay | Admitting: Pharmacy Technician

## 2011-11-30 ENCOUNTER — Other Ambulatory Visit (HOSPITAL_COMMUNITY): Payer: Medicare Other

## 2011-12-03 ENCOUNTER — Encounter (HOSPITAL_COMMUNITY): Payer: Self-pay

## 2011-12-03 ENCOUNTER — Encounter (HOSPITAL_COMMUNITY)
Admission: RE | Admit: 2011-12-03 | Discharge: 2011-12-03 | Disposition: A | Discharge status: Home / Self Care | Payer: Medicare Other | Source: Ambulatory Visit | Attending: Orthopedic Surgery | Admitting: Orthopedic Surgery

## 2011-12-03 HISTORY — DX: Thrombocytopenia, unspecified: D69.6

## 2011-12-03 LAB — COMPREHENSIVE METABOLIC PANEL
ALT: 18 U/L (ref 0–35)
CO2: 26 mEq/L (ref 19–32)
Calcium: 9.5 mg/dL (ref 8.4–10.5)
Creatinine, Ser: 0.66 mg/dL (ref 0.50–1.10)
GFR calc Af Amer: >90 mL/min (ref >90)
GFR calc non Af Amer: >90 mL/min (ref >90)
Glucose, Bld: 113 mg/dL — ABNORMAL HIGH (ref 70–99)
Sodium: 141 mEq/L (ref 135–145)

## 2011-12-03 LAB — URINALYSIS, ROUTINE W REFLEX MICROSCOPIC
Bilirubin Urine: NEGATIVE
Hgb urine dipstick: NEGATIVE
Ketones, ur: NEGATIVE mg/dL
Protein, ur: NEGATIVE mg/dL
Urobilinogen, UA: 1 mg/dL (ref 0.0–1.0)

## 2011-12-03 LAB — CBC WITH DIFFERENTIAL/PLATELET
Eosinophils Relative: 0 % (ref 0–5)
HCT: 36.3 % (ref 36.0–46.0)
Lymphocytes Relative: 29 % (ref 12–46)
Lymphs Abs: 0.8 10*3/uL (ref 0.7–4.0)
MCV: 87.5 fL (ref 78.0–100.0)
Monocytes Absolute: 0.2 10*3/uL (ref 0.1–1.0)
RBC: 4.15 MIL/uL (ref 3.87–5.11)
WBC: 2.8 10*3/uL — ABNORMAL LOW (ref 4.0–10.5)

## 2011-12-03 LAB — PROTIME-INR: INR: 1.16 (ref 0.00–1.49)

## 2011-12-03 NOTE — Pre-Procedure Instructions (Signed)
20 [name]   12/03/2011   Your procedure is scheduled on: Tuesday, October 8TH   Report to [facility] at 8:15 AM.  Call this number if you have problems the morning of surgery: [phone]   Remember:   Do not eat food or drink any liquids:After Midnight Monday.   Take these medicines the morning of surgery with A SIP OF WATER: valium, norco,   Do not wear jewelry, make-up or nail polish.  Do not wear lotions, powders, or perfumes. You may NOT wear deodorant.  Do not shave 48 hours prior to surgery. Men may shave face and neck.   Do not bring valuables to the hospital.  Contacts, dentures or bridgework may not be worn into surgery.   Leave suitcase in the car. After surgery it may be brought to your room.  For patients admitted to the hospital, checkout time is 11:00 AM the day of discharge.   Patients discharged the day of surgery will not be allowed to drive home and a responsible person needs to stay with you              Overnight for the first 24 hrs.   Name and phone number of your driver:    Special Instructions: Shower using CHG 2 nights before surgery and the night before surgery.  If you shower the day of surgery use CHG.  Use special wash - you have one bottle of CHG for all showers.  You should use approximately 1/3 of the bottle for each shower.   Please read over the following fact sheets that you were given: Pain Booklet, Coughing and Deep Breathing, Blood Transfusion Information, MRSA Information and Surgical Site Infection Prevention

## 2011-12-03 NOTE — Progress Notes (Addendum)
H/O LEFT BREAST CANCER IN 2005--HAD ?LUMPECTOMY VS MASTECTOMY  BY  DR  DAVID Ezzard Standing...ONCOLOGIST DR. Darrold Span  DISCOVERED THAT HER PLATELET COUNT WAS RUNNING LOW.Marland KitchenMarland KitchenSPLEEN ENLARGED....h/O CIRRHOSIS---NEVER DRANK, NEVER SMOKED, OR ABUSED DRUGS. DR CHANDLER WAS TO SPEAK WITH PCP ABOUT UPCOMING SURGERY AND SHOULD IT TAKE PLACE. Da  REQUESTED SLEEP STUDY RESULTS FROM DR. A CHAING FROM [facility].  PT STATES THAT SINCE SHE HAS HURT ARM, SHE IS UNABLE TO WEAR THE CPAP MASK OR USE MACHINE.Marland KitchenMarland KitchenDA

## 2011-12-04 NOTE — Consult Note (Signed)
Anesthesia chart review: Patient is a 66 year old female scheduled for open reduction internal fixation, left humerus fracture with iliac crest bone graft by Dr. Ave Filter on 12/11/2011.  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  PCP is Dr. Evlyn Kanner.  Has been evaluated by Hematologist/Oncologist Dr. Darrold Span in the past (records requested as available).    EKG on 06/14/11 showed NSR.  Cardiac CT on 12/24/08 showed: 1. No acute extracardiac process in the chest.  2. Distal esophageal wall thickening which suggests esophagitis.  3. Incompletely imaged portal venous hypertension, as detailed on the 03/23/08 exam.  Probable small esophageal varices. 4. Left dominant coronary arterial system with no significant CAD. Mild atherosclerosis noted in the descending aorta.  Chest x-ray on 06/14/2011 showed no active lung disease. Heart upper normal.  CT of the abdomen on 03/15/11 showed changes of cirrhosis with no ascites.  Labs noted.  Cr 0.66, AST/ALT 40/18, WBC 2.8, H/H 12.2/36.3, PLT 59 (stable), PT/PTT WNL.  T&S done.  UA showed moderate leukocytes, but was otherwise negative. I called her PLT and UA results to Darl Pikes at Dr. Veda Canning office.  She will review with Dr. Ave Filter.  She says that Dr. Ave Filter has already ordered to have platelets on standby if needed on the day of surgery.    I reviewed cirrhosis and thrombocytopenia history with Anesthesiologist Dr. Ivin Booty.  No additional orders since labs are stable, and Dr. Ave Filter has already ordered a T&S and PLTs on standby.  Shonna Chock, PA-C

## 2011-12-05 ENCOUNTER — Other Ambulatory Visit: Payer: Self-pay | Admitting: Orthopedic Surgery

## 2011-12-05 NOTE — Progress Notes (Signed)
Spoke with blood bank and they did not have any official information on pt needing platelets or how many to have available.  Left voice message with Darl Pikes (scheduling coordinator at Dr Veda Canning office) re: need for platelet order/number of units or need.

## 2011-12-07 NOTE — Progress Notes (Signed)
Spoke with blood bank and they are aware of need to have platelets available and they have spoken to Darl Pikes in Dr Veda Canning office.  Stated that there is no way to put in EPIC to have plts available, but they will have the platelets available and the order to give will have to be entered if wanted to be given.

## 2011-12-10 MED ORDER — POVIDONE-IODINE 7.5 % EX SOLN
Freq: Once | CUTANEOUS | Status: DC
  Filled 2011-12-10: qty 118

## 2011-12-10 MED ORDER — CLINDAMYCIN PHOSPHATE 900 MG/50ML IV SOLN
900.0000 mg | INTRAVENOUS | Status: AC
  Administered 2011-12-11: 900 mg via INTRAVENOUS
  Filled 2011-12-10: qty 50

## 2011-12-11 ENCOUNTER — Encounter (HOSPITAL_COMMUNITY)
Admission: RE | Disposition: A | Discharge status: Home / Self Care | Payer: Self-pay | Source: Ambulatory Visit | Attending: Orthopedic Surgery

## 2011-12-11 ENCOUNTER — Encounter (HOSPITAL_COMMUNITY): Payer: Self-pay | Admitting: *Deleted

## 2011-12-11 ENCOUNTER — Encounter (HOSPITAL_COMMUNITY): Payer: Self-pay | Admitting: Vascular Surgery

## 2011-12-11 ENCOUNTER — Inpatient Hospital Stay (HOSPITAL_COMMUNITY)
Admission: RE | Admit: 2011-12-11 | Discharge: 2011-12-14 | DRG: 493 | Disposition: A | Discharge status: Home / Self Care | Payer: Medicare Other | Source: Ambulatory Visit | Attending: Orthopedic Surgery | Admitting: Orthopedic Surgery

## 2011-12-11 ENCOUNTER — Ambulatory Visit (HOSPITAL_COMMUNITY): Payer: Medicare Other

## 2011-12-11 ENCOUNTER — Ambulatory Visit (HOSPITAL_COMMUNITY): Payer: Medicare Other | Admitting: Vascular Surgery

## 2011-12-11 DIAGNOSIS — S42309K Unspecified fracture of shaft of humerus, unspecified arm, subsequent encounter for fracture with nonunion: Secondary | ICD-10-CM | POA: Diagnosis present

## 2011-12-11 DIAGNOSIS — D62 Acute posthemorrhagic anemia: Secondary | ICD-10-CM | POA: Diagnosis not present

## 2011-12-11 DIAGNOSIS — D696 Thrombocytopenia, unspecified: Secondary | ICD-10-CM | POA: Diagnosis present

## 2011-12-11 DIAGNOSIS — I10 Essential (primary) hypertension: Secondary | ICD-10-CM | POA: Diagnosis present

## 2011-12-11 DIAGNOSIS — G2581 Restless legs syndrome: Secondary | ICD-10-CM | POA: Diagnosis present

## 2011-12-11 DIAGNOSIS — S42309A Unspecified fracture of shaft of humerus, unspecified arm, initial encounter for closed fracture: Secondary | ICD-10-CM

## 2011-12-11 DIAGNOSIS — I959 Hypotension, unspecified: Secondary | ICD-10-CM | POA: Diagnosis not present

## 2011-12-11 DIAGNOSIS — E119 Type 2 diabetes mellitus without complications: Secondary | ICD-10-CM | POA: Diagnosis present

## 2011-12-11 DIAGNOSIS — Z01812 Encounter for preprocedural laboratory examination: Secondary | ICD-10-CM

## 2011-12-11 DIAGNOSIS — IMO0002 Reserved for concepts with insufficient information to code with codable children: Principal | ICD-10-CM | POA: Diagnosis present

## 2011-12-11 HISTORY — DX: Family history of other specified conditions: Z84.89

## 2011-12-11 HISTORY — PX: ORIF HUMERUS FRACTURE: SHX2126

## 2011-12-11 SURGERY — OPEN REDUCTION INTERNAL FIXATION (ORIF) HUMERAL SHAFT FRACTURE
Anesthesia: Choice | Site: Arm Upper | Laterality: Left | Wound class: Clean

## 2011-12-11 MED ORDER — LACTATED RINGERS IV SOLN
INTRAVENOUS | Status: DC | PRN
  Administered 2011-12-11 (×3): via INTRAVENOUS

## 2011-12-11 MED ORDER — OXYCODONE HCL 5 MG PO TABS
5.0000 mg | ORAL_TABLET | Freq: Once | ORAL | Status: AC | PRN
  Administered 2011-12-11: 5 mg via ORAL

## 2011-12-11 MED ORDER — FLEET ENEMA 7-19 GM/118ML RE ENEM: 1.0000 | ENEMA | Freq: Once | RECTAL | Status: AC | PRN

## 2011-12-11 MED ORDER — GABAPENTIN 100 MG PO CAPS
200.0000 mg | ORAL_CAPSULE | Freq: Every day | ORAL | Status: DC
  Administered 2011-12-11 – 2011-12-13 (×3): 200 mg via ORAL
  Filled 2011-12-11 (×4): qty 2

## 2011-12-11 MED ORDER — SUFENTANIL CITRATE 50 MCG/ML IV SOLN
INTRAVENOUS | Status: DC | PRN
  Administered 2011-12-11: 5 ug via INTRAVENOUS
  Administered 2011-12-11: 10 ug via INTRAVENOUS
  Administered 2011-12-11: 5 ug via INTRAVENOUS

## 2011-12-11 MED ORDER — ZOLPIDEM TARTRATE 5 MG PO TABS: 5.0000 mg | ORAL_TABLET | Freq: Every evening | ORAL | Status: DC | PRN

## 2011-12-11 MED ORDER — METOCLOPRAMIDE HCL 10 MG PO TABS: 5.0000 mg | ORAL_TABLET | Freq: Three times a day (TID) | ORAL | Status: DC | PRN

## 2011-12-11 MED ORDER — MIDAZOLAM HCL 5 MG/5ML IJ SOLN
INTRAMUSCULAR | Status: DC | PRN
  Administered 2011-12-11 (×2): 1 mg via INTRAVENOUS

## 2011-12-11 MED ORDER — SPIRONOLACTONE 50 MG PO TABS
50.0000 mg | ORAL_TABLET | Freq: Every day | ORAL | Status: DC
  Administered 2011-12-12 – 2011-12-13 (×2): 50 mg via ORAL
  Filled 2011-12-11 (×3): qty 1

## 2011-12-11 MED ORDER — DIPHENHYDRAMINE HCL 12.5 MG/5ML PO ELIX: 12.5000 mg | ORAL_SOLUTION | ORAL | Status: DC | PRN

## 2011-12-11 MED ORDER — PROPOFOL 10 MG/ML IV BOLUS
INTRAVENOUS | Status: DC | PRN
  Administered 2011-12-11: 150 mg via INTRAVENOUS

## 2011-12-11 MED ORDER — LACTULOSE 10 GM/15ML PO SOLN
45.0000 g | Freq: Every day | ORAL | Status: DC | PRN
  Filled 2011-12-11: qty 90

## 2011-12-11 MED ORDER — OXYCODONE HCL 5 MG/5ML PO SOLN: 5.0000 mg | Freq: Once | ORAL | Status: AC | PRN

## 2011-12-11 MED ORDER — ROPIVACAINE HCL 5 MG/ML IJ SOLN
INTRAMUSCULAR | Status: DC | PRN
  Administered 2011-12-11: 20 mL via EPIDURAL

## 2011-12-11 MED ORDER — PRAMIPEXOLE DIHYDROCHLORIDE 1 MG PO TABS
1.0000 mg | ORAL_TABLET | Freq: Every day | ORAL | Status: DC
  Administered 2011-12-11 – 2011-12-12 (×2): 1 mg via ORAL
  Administered 2011-12-13: 2 mg via ORAL
  Filled 2011-12-11 (×4): qty 2

## 2011-12-11 MED ORDER — BISACODYL 5 MG PO TBEC: 5.0000 mg | DELAYED_RELEASE_TABLET | Freq: Every day | ORAL | Status: DC | PRN

## 2011-12-11 MED ORDER — PHENYLEPHRINE HCL 10 MG/ML IJ SOLN
INTRAMUSCULAR | Status: DC | PRN
  Administered 2011-12-11 (×2): 80 ug via INTRAVENOUS
  Administered 2011-12-11: 120 ug via INTRAVENOUS
  Administered 2011-12-11: 80 ug via INTRAVENOUS
  Administered 2011-12-11: 40 ug via INTRAVENOUS

## 2011-12-11 MED ORDER — FENTANYL CITRATE 0.05 MG/ML IJ SOLN
INTRAMUSCULAR | Status: AC
  Filled 2011-12-11: qty 2

## 2011-12-11 MED ORDER — MORPHINE SULFATE 2 MG/ML IJ SOLN
2.0000 mg | INTRAMUSCULAR | Status: DC | PRN
  Administered 2011-12-11 – 2011-12-12 (×4): 2 mg via INTRAVENOUS
  Administered 2011-12-13: 4 mg via INTRAVENOUS
  Administered 2011-12-13 (×2): 2 mg via INTRAVENOUS
  Filled 2011-12-11 (×6): qty 1
  Filled 2011-12-11: qty 2

## 2011-12-11 MED ORDER — FENTANYL 50 MCG/HR TD PT72
75.0000 ug | MEDICATED_PATCH | TRANSDERMAL | Status: DC
  Administered 2011-12-14: 75 ug via TRANSDERMAL
  Filled 2011-12-11: qty 1

## 2011-12-11 MED ORDER — PROMETHAZINE HCL 25 MG/ML IJ SOLN
INTRAMUSCULAR | Status: AC
  Filled 2011-12-11: qty 1

## 2011-12-11 MED ORDER — ZOLPIDEM TARTRATE 10 MG PO TABS: 10.0000 mg | ORAL_TABLET | Freq: Every evening | ORAL | Status: DC | PRN

## 2011-12-11 MED ORDER — ALUM & MAG HYDROXIDE-SIMETH 200-200-20 MG/5ML PO SUSP: 30.0000 mL | ORAL | Status: DC | PRN

## 2011-12-11 MED ORDER — ACETAMINOPHEN 325 MG PO TABS
650.0000 mg | ORAL_TABLET | Freq: Four times a day (QID) | ORAL | Status: DC | PRN
  Administered 2011-12-13: 650 mg via ORAL
  Filled 2011-12-11: qty 2

## 2011-12-11 MED ORDER — PROMETHAZINE HCL 12.5 MG PO TABS: 12.5000 mg | ORAL_TABLET | Freq: Four times a day (QID) | ORAL | Status: DC | PRN

## 2011-12-11 MED ORDER — PHENOL 1.4 % MT LIQD: 1.0000 | OROMUCOSAL | Status: DC | PRN

## 2011-12-11 MED ORDER — FUROSEMIDE 20 MG PO TABS
20.0000 mg | ORAL_TABLET | ORAL | Status: DC
  Administered 2011-12-12: 20 mg via ORAL
  Filled 2011-12-11 (×2): qty 1

## 2011-12-11 MED ORDER — SODIUM CHLORIDE 0.9 % IV SOLN: INTRAVENOUS | Status: DC

## 2011-12-11 MED ORDER — LACTATED RINGERS IV SOLN
INTRAVENOUS | Status: DC
  Administered 2011-12-11: 50 mL/h via INTRAVENOUS

## 2011-12-11 MED ORDER — METOCLOPRAMIDE HCL 5 MG/ML IJ SOLN: 5.0000 mg | Freq: Three times a day (TID) | INTRAMUSCULAR | Status: DC | PRN

## 2011-12-11 MED ORDER — SENNOSIDES-DOCUSATE SODIUM 8.6-50 MG PO TABS: 1.0000 | ORAL_TABLET | Freq: Every evening | ORAL | Status: DC | PRN

## 2011-12-11 MED ORDER — OXYCODONE HCL 5 MG PO TABS
ORAL_TABLET | ORAL | Status: AC
  Filled 2011-12-11: qty 1

## 2011-12-11 MED ORDER — ACETAMINOPHEN 650 MG RE SUPP: 650.0000 mg | Freq: Four times a day (QID) | RECTAL | Status: DC | PRN

## 2011-12-11 MED ORDER — FENTANYL CITRATE 0.05 MG/ML IJ SOLN
INTRAMUSCULAR | Status: DC | PRN
  Administered 2011-12-11: 100 ug via INTRAVENOUS

## 2011-12-11 MED ORDER — HYDROMORPHONE HCL PF 1 MG/ML IJ SOLN
INTRAMUSCULAR | Status: AC
  Filled 2011-12-11: qty 1

## 2011-12-11 MED ORDER — DIAZEPAM 5 MG PO TABS
5.0000 mg | ORAL_TABLET | Freq: Four times a day (QID) | ORAL | Status: DC | PRN
  Administered 2011-12-11 – 2011-12-13 (×2): 5 mg via ORAL
  Filled 2011-12-11: qty 1

## 2011-12-11 MED ORDER — HYDROMORPHONE HCL PF 1 MG/ML IJ SOLN
0.2500 mg | INTRAMUSCULAR | Status: DC | PRN
  Administered 2011-12-11 (×4): 0.5 mg via INTRAVENOUS

## 2011-12-11 MED ORDER — VITAMIN D3 25 MCG (1000 UNIT) PO TABS
1000.0000 [IU] | ORAL_TABLET | Freq: Two times a day (BID) | ORAL | Status: DC
  Administered 2011-12-11 – 2011-12-14 (×6): 1000 [IU] via ORAL
  Filled 2011-12-11 (×7): qty 1

## 2011-12-11 MED ORDER — LIDOCAINE HCL (CARDIAC) 20 MG/ML IV SOLN
INTRAVENOUS | Status: DC | PRN
  Administered 2011-12-11: 70 mg via INTRAVENOUS

## 2011-12-11 MED ORDER — ONDANSETRON HCL 4 MG PO TABS: 4.0000 mg | ORAL_TABLET | Freq: Four times a day (QID) | ORAL | Status: DC | PRN

## 2011-12-11 MED ORDER — VANCOMYCIN HCL IN DEXTROSE 1-5 GM/200ML-% IV SOLN
1000.0000 mg | Freq: Two times a day (BID) | INTRAVENOUS | Status: AC
  Administered 2011-12-11: 1000 mg via INTRAVENOUS
  Filled 2011-12-11: qty 200

## 2011-12-11 MED ORDER — MENTHOL 3 MG MT LOZG: 1.0000 | LOZENGE | OROMUCOSAL | Status: DC | PRN

## 2011-12-11 MED ORDER — LISINOPRIL 10 MG PO TABS
10.0000 mg | ORAL_TABLET | Freq: Every day | ORAL | Status: DC
  Administered 2011-12-11 – 2011-12-12 (×2): 10 mg via ORAL
  Filled 2011-12-11 (×4): qty 1

## 2011-12-11 MED ORDER — SENNA 8.6 MG PO TABS
1.0000 | ORAL_TABLET | Freq: Two times a day (BID) | ORAL | Status: DC
  Administered 2011-12-11 – 2011-12-14 (×6): 8.6 mg via ORAL
  Filled 2011-12-11 (×7): qty 1

## 2011-12-11 MED ORDER — METAXALONE 800 MG PO TABS
800.0000 mg | ORAL_TABLET | Freq: Two times a day (BID) | ORAL | Status: DC
  Administered 2011-12-11 – 2011-12-14 (×6): 800 mg via ORAL
  Filled 2011-12-11 (×7): qty 1

## 2011-12-11 MED ORDER — BUPROPION HCL ER (XL) 150 MG PO TB24
150.0000 mg | ORAL_TABLET | Freq: Every day | ORAL | Status: DC
  Administered 2011-12-11 – 2011-12-13 (×3): 150 mg via ORAL
  Filled 2011-12-11 (×4): qty 1

## 2011-12-11 MED ORDER — OXYCODONE HCL 5 MG PO TABS
10.0000 mg | ORAL_TABLET | ORAL | Status: DC | PRN
  Administered 2011-12-11 – 2011-12-14 (×8): 10 mg via ORAL
  Filled 2011-12-11 (×8): qty 2

## 2011-12-11 MED ORDER — PROMETHAZINE HCL 25 MG/ML IJ SOLN
6.2500 mg | INTRAMUSCULAR | Status: DC | PRN
  Administered 2011-12-11: 6.25 mg via INTRAVENOUS

## 2011-12-11 MED ORDER — ROCURONIUM BROMIDE 100 MG/10ML IV SOLN
INTRAVENOUS | Status: DC | PRN
  Administered 2011-12-11: 50 mg via INTRAVENOUS

## 2011-12-11 MED ORDER — ONDANSETRON HCL 4 MG/2ML IJ SOLN: 4.0000 mg | Freq: Four times a day (QID) | INTRAMUSCULAR | Status: DC | PRN

## 2011-12-11 SURGICAL SUPPLY — 65 items
APL SKNCLS STERI-STRIP NONHPOA (GAUZE/BANDAGES/DRESSINGS)
BANDAGE ELASTIC 4 VELCRO ST LF (GAUZE/BANDAGES/DRESSINGS) ×2 IMPLANT
BANDAGE ELASTIC 6 VELCRO ST LF (GAUZE/BANDAGES/DRESSINGS) ×2 IMPLANT
BANDAGE GAUZE ELAST BULKY 4 IN (GAUZE/BANDAGES/DRESSINGS) ×4 IMPLANT
BENZOIN TINCTURE PRP APPL 2/3 (GAUZE/BANDAGES/DRESSINGS) ×2 IMPLANT
BIT DRILL Q COUPLING 4.5 (BIT) ×2 IMPLANT
BIT DRILL Q/COUPLING 1 (BIT) ×2 IMPLANT
BNDG CMPR 9X4 STRL LF SNTH (GAUZE/BANDAGES/DRESSINGS) ×2
BNDG ESMARK 4X9 LF (GAUZE/BANDAGES/DRESSINGS) ×3 IMPLANT
BRUSH SCRUB DISP (MISCELLANEOUS) ×6 IMPLANT
CLOTH BEACON ORANGE TIMEOUT ST (SAFETY) ×3 IMPLANT
CLSR STERI-STRIP ANTIMIC 1/2X4 (GAUZE/BANDAGES/DRESSINGS) ×2 IMPLANT
CORDS BIPOLAR (ELECTRODE) ×3 IMPLANT
COVER SURGICAL LIGHT HANDLE (MISCELLANEOUS) ×9 IMPLANT
DRAPE C-ARM 42X72 X-RAY (DRAPES) ×3 IMPLANT
DRAPE INCISE IOBAN 66X45 STRL (DRAPES) IMPLANT
DRAPE SURG 17X11 SM STRL (DRAPES) ×2 IMPLANT
DRAPE U-SHAPE 47X51 STRL (DRAPES) ×4 IMPLANT
DRSG ADAPTIC 3X8 NADH LF (GAUZE/BANDAGES/DRESSINGS) ×1 IMPLANT
DRSG PAD ABDOMINAL 8X10 ST (GAUZE/BANDAGES/DRESSINGS) ×1 IMPLANT
ELECT REM PT RETURN 9FT ADLT (ELECTROSURGICAL) ×3
ELECTRODE REM PT RTRN 9FT ADLT (ELECTROSURGICAL) ×2 IMPLANT
EVACUATOR 1/8 PVC DRAIN (DRAIN) IMPLANT
GLOVE BIO SURGEON STRL SZ7 (GLOVE) ×3 IMPLANT
GLOVE BIO SURGEON STRL SZ7.5 (GLOVE) ×1 IMPLANT
GLOVE BIOGEL PI IND STRL 7.0 (GLOVE) ×2 IMPLANT
GLOVE BIOGEL PI IND STRL 7.5 (GLOVE) ×2 IMPLANT
GLOVE BIOGEL PI IND STRL 8 (GLOVE) ×2 IMPLANT
GLOVE BIOGEL PI INDICATOR 7.0 (GLOVE) ×2
GLOVE BIOGEL PI INDICATOR 7.5 (GLOVE) ×1
GLOVE BIOGEL PI INDICATOR 8 (GLOVE) ×1
GLOVE SURG ORTHO 7.0 STRL STRW (GLOVE) ×4 IMPLANT
GLOVE SURG SS PI 7.0 STRL IVOR (GLOVE) ×2 IMPLANT
GOWN PREVENTION PLUS LG XLONG (DISPOSABLE) ×1 IMPLANT
GOWN STRL NON-REIN LRG LVL3 (GOWN DISPOSABLE) ×12 IMPLANT
KIT BASIN OR (CUSTOM PROCEDURE TRAY) ×3 IMPLANT
KIT INFUSE LRG II (Orthopedic Implant) ×2 IMPLANT
KIT ROOM TURNOVER OR (KITS) ×3 IMPLANT
MANIFOLD NEPTUNE II (INSTRUMENTS) ×3 IMPLANT
NDL HYPO 25X1 1.5 SAFETY (NEEDLE) ×1 IMPLANT
NEEDLE HYPO 25X1 1.5 SAFETY (NEEDLE) ×3 IMPLANT
NS IRRIG 1000ML POUR BTL (IV SOLUTION) ×6 IMPLANT
PACK TOTAL JOINT (CUSTOM PROCEDURE TRAY) ×3 IMPLANT
PAD ARMBOARD 7.5X6 YLW CONV (MISCELLANEOUS) ×4 IMPLANT
SCREW CORTEX ST 4.5X24 (Screw) ×12 IMPLANT
SCREW CORTEX ST 4.5X26 (Screw) ×2 IMPLANT
SCREW CORTEX ST 4.5X28 (Screw) ×2 IMPLANT
SLING ARM FOAM STRAP XLG (SOFTGOODS) ×2 IMPLANT
SPONGE GAUZE 4X4 12PLY (GAUZE/BANDAGES/DRESSINGS) ×4 IMPLANT
SPONGE LAP 18X18 X RAY DECT (DISPOSABLE) ×2 IMPLANT
STAPLER VISISTAT 35W (STAPLE) ×1 IMPLANT
STOCKINETTE IMPERVIOUS 9X36 MD (GAUZE/BANDAGES/DRESSINGS) ×2 IMPLANT
SUCTION FRAZIER TIP 10 FR DISP (SUCTIONS) ×3 IMPLANT
SUT ETHIBOND 5 LR DA (SUTURE) ×1 IMPLANT
SUT VIC AB 0 CT1 27 (SUTURE) ×3
SUT VIC AB 0 CT1 27XBRD ANBCTR (SUTURE) ×3 IMPLANT
SUT VIC AB 2-0 CT1 27 (SUTURE) ×3
SUT VIC AB 2-0 CT1 TAPERPNT 27 (SUTURE) ×3 IMPLANT
SYR 5ML LL (SYRINGE) IMPLANT
SYR CONTROL 10ML LL (SYRINGE) ×3 IMPLANT
TOWEL OR 17X24 6PK STRL BLUE (TOWEL DISPOSABLE) ×3 IMPLANT
TOWEL OR 17X26 10 PK STRL BLUE (TOWEL DISPOSABLE) ×7 IMPLANT
TRAY FOLEY CATH 14FR (SET/KITS/TRAYS/PACK) ×2 IMPLANT
WATER STERILE IRR 1000ML POUR (IV SOLUTION) ×2 IMPLANT
YANKAUER SUCT BULB TIP NO VENT (SUCTIONS) ×2 IMPLANT

## 2011-12-11 NOTE — Anesthesia Preprocedure Evaluation (Signed)
Anesthesia Evaluation  Patient identified by MRN, date of birth, ID band Patient awake    Reviewed: Allergy & Precautions, H&P , NPO status , Patient's Chart, lab work & pertinent test results  Airway Mallampati: II  Neck ROM: Full    Dental   Pulmonary shortness of breath, sleep apnea ,  breath sounds clear to auscultation        Cardiovascular hypertension, + Valvular Problems/Murmurs Rhythm:Regular Rate:Normal     Neuro/Psych  Headaches, Anxiety Depression    GI/Hepatic hiatal hernia, (+) Cirrhosis -       ,   Endo/Other  diabetesMorbid obesity  Renal/GU Renal InsufficiencyRenal disease     Musculoskeletal  (+) Arthritis -,   Abdominal (+) + obese,   Peds  Hematology   Anesthesia Other Findings   Reproductive/Obstetrics                           Anesthesia Physical Anesthesia Plan  ASA: III  Anesthesia Plan: General   Post-op Pain Management:    Induction: Intravenous  Airway Management Planned: Oral ETT  Additional Equipment:   Intra-op Plan:   Post-operative Plan: Extubation in OR  Informed Consent: I have reviewed the patients History and Physical, chart, labs and discussed the procedure including the risks, benefits and alternatives for the proposed anesthesia with the patient or authorized representative who has indicated his/her understanding and acceptance.   Dental advisory given  Plan Discussed with: CRNA and Surgeon  Anesthesia Plan Comments:         Anesthesia Quick Evaluation

## 2011-12-11 NOTE — Progress Notes (Signed)
Orthopedic Tech Progress Note Patient Details:  [name] Jan 19, 1946 [id number]  Ortho Devices Type of Ortho Device: Arm foam sling Ortho Device/Splint Location: left arm Ortho Device/Splint Interventions: Application   [name] 12/11/2011, 8:33 PM

## 2011-12-11 NOTE — Preoperative (Signed)
Beta Blockers   Reason not to administer Beta Blockers:Not Applicable 

## 2011-12-11 NOTE — Transfer of Care (Signed)
Immediate Anesthesia Transfer of Care Note  Patient: [name]  Procedure(s) Performed: Procedure(s) (LRB) with comments: OPEN REDUCTION INTERNAL FIXATION (ORIF) HUMERAL SHAFT FRACTURE (Left) -  Removal of Hardware left humerus, Open Reduction Internal Fixation Left Humeral Shaft Fracture Non-Union  Patient Location: PACU  Anesthesia Type: General  Level of Consciousness: awake, alert  and oriented  Airway & Oxygen Therapy: Patient Spontanous Breathing and Patient connected to nasal cannula oxygen  Post-op Assessment: Report given to PACU RN and Post -op Vital signs reviewed and stable  Post vital signs: Reviewed and stable  Complications: No apparent anesthesia complications

## 2011-12-11 NOTE — H&P (Signed)
[name] is an 66 y.o. female.   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  Failed conservative treatment with bone stimulator.  C/o continued pain.  Past Medical History  Diagnosis Date  . Hypertension   . Restless leg   . Heart murmur   . Shortness of breath     with exertion   . Sleep apnea 2013    sleep study at [facility]    uses cpap  . Diabetes mellitus     no meds  . 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  platelets  low... wbc also low per pt ..   . Blood dyscrasia     low platelets ... low wbc.  ? liver  disease.   . H/O hiatal hernia   . Headache   . Anxiety   . Depression   . Complication of anesthesia     early wake up "while tube is in her throat"  . Adverse effect of general anesthetic     with last arm surgery...anesthesia went well.  . Chronic kidney disease 50's    nephritis--none at present *12/03/2011)  . Cancer 2005    left breast  . Platelets decreased     ??   D/T CIRRHOISIS    Past Surgical History  Procedure Date  . Cholecystectomy   . Appendectomy   . Tonsillectomy   . Abdominal hysterectomy   . Cardiac catheterization 1975    normal cath  -low potassium  . Orif humerus fracture 06/19/2011    Procedure: OPEN REDUCTION INTERNAL FIXATION (ORIF) DISTAL HUMERUS FRACTURE;  Surgeon: Mable Paris, MD;  Location: [facility] OR;  Service: Orthopedics;  Laterality: Left;  . Breast surgery     left lumpectomy--few nodes removed  . Mastectomy     Family History  Problem Relation Age of Onset  . Anesthesia problems Daughter    Social History:  reports that she has never smoked. She does not have any smokeless tobacco history on file. She reports that she does not drink alcohol or use illicit drugs.  Allergies:  Allergies  Allergen Reactions  . Penicillins Anaphylaxis, Itching and Swelling  . Aspirin     Has liver disease  . Midazolam Hcl Other (See Comments)    Daughter has allergy, doctors advised to warn  against using.Marland KitchenMarland KitchenPropofol is okay to use.  . Niacin And Related Itching    Feels like skin is on fire    Medications Prior to Admission  Medication Sig Dispense Refill  . BuPROPion HCl (WELLBUTRIN XL PO) Take 225 mg by mouth at bedtime.      . cholecalciferol (VITAMIN D) 1000 UNITS tablet Take 1,000 Units by mouth 2 (two) times daily.      . diazepam (VALIUM) 5 MG tablet Take 5 mg by mouth every 6 (six) hours as needed. Anxiety      . fentaNYL (DURAGESIC - DOSED MCG/HR) 75 MCG/HR Place 1 patch onto the skin every 3 (three) days.      . furosemide (LASIX) 20 MG tablet Take 20 mg by mouth every other day.       . gabapentin (NEURONTIN) 100 MG capsule Take 200 mg by mouth at bedtime.       [name] HYDROcodone-acetaminophen (NORCO) 10-325 MG per tablet Take 1 tablet by mouth every 6 (six) hours as needed. For pain      . lactulose (CHRONULAC) 10 GM/15ML solution Take 45 g by mouth  daily as needed. For constipation      . lisinopril (PRINIVIL,ZESTRIL) 10 MG tablet Take 10 mg by mouth daily.      . metaxalone (SKELAXIN) 800 MG tablet Take 800 mg by mouth 2 (two) times daily.      . pramipexole (MIRAPEX) 1 MG tablet Take 1-2 mg by mouth at bedtime.       . promethazine (PHENERGAN) 12.5 MG tablet Take 12.5 mg by mouth every 6 (six) hours as needed. For nausea      . spironolactone (ALDACTONE) 50 MG tablet Take 50 mg by mouth daily.        [name] zolpidem (AMBIEN) 10 MG tablet Take 10 mg by mouth at bedtime as needed. For sleep        No results found for this or any previous visit (from the past 48 hour(s)). No results found.  Review of Systems  All other systems reviewed and are negative.    Blood pressure 136/71, pulse 74, temperature 98.3 F (36.8 C), temperature source Oral, resp. rate 18, SpO2 97.00%. Physical Exam  Constitutional: She is oriented to person, place, and time. She appears well-developed and well-nourished.  HENT:  Head: Atraumatic.  Eyes: EOM are normal.  Cardiovascular: Intact  distal pulses.   Respiratory: Effort normal.  Musculoskeletal:       LUE TTP over midshaft humerus NVID  Neurological: She is alert and oriented to person, place, and time.  Skin: Skin is warm and dry.  Psychiatric: She has a normal mood and affect.     Assessment/Plan L humeral shaft nonunion Plan revision ORIF with Infuse augmentation. Risks / benefits of surgery discussed Consent on chart  NPO for OR Preop antibiotics   [name] 12/11/2011, 9:59 AM

## 2011-12-11 NOTE — Anesthesia Procedure Notes (Signed)
Anesthesia Regional Block:  Supraclavicular block  Pre-Anesthetic Checklist: ,, timeout performed, Correct Patient, Correct Site, Correct Laterality, Correct Procedure, Correct Position, site marked, Risks and benefits discussed, Surgical consent,  Pre-op evaluation,  At surgeon's request  Laterality: Left and Upper  Prep: Dura Prep       Needles:   Needle Type: Echogenic Needle     Needle Length: 5cm 5 cm     Additional Needles:  Procedures: ultrasound guided Supraclavicular block Narrative:  Start time: 12/11/2011 10:05 AM End time: 12/11/2011 10:39 AM Injection made incrementally with aspirations every 5 mL.  Performed by: Personally  Anesthesiologist: T Cadyn Rodger  Additional Notes: Tolerated well, to OR  Supraclavicular block

## 2011-12-11 NOTE — Op Note (Signed)
Procedure(s): OPEN REDUCTION INTERNAL FIXATION (ORIF) LEFT HUMERAL SHAFT FRACTURE NONUNION Procedure Note  [name] female 66 y.o. 12/11/2011  Procedure(s) and Anesthesia Type:    * OPEN REDUCTION INTERNAL FIXATION (ORIF) LEFT HUMERAL SHAFT FRACTURE NONUNION - general anesthesia and preoperative regional block  Surgeon(s) and Role:    * Mable Paris, MD - Primary   Indications:  66 y.o. female s/p ORIF left humeral shaft fracture approximately 6 months ago with subsequent nonunion. She failed to heal the fracture site medical optimization and a bone stimulator. She went on to have continued pain until loosening of the initial hardware. She was indicated for revision ORIF with compression plating and use of infuse BMP-2 for augmentation of nonhealing given her comorbidities and failure of previous surgery.    Surgeon: Mable Paris   Assistants: Damita Lack PA-C [facility] was essential for retraction and positioning and was scrubbed throughout the procedure.)  Anesthesia: General endotracheal anesthesia with preoperative block    Procedure Detail  OPEN REDUCTION INTERNAL FIXATION (ORIF) HUMERAL SHAFT FRACTURE  Findings:  the previously placed Synthes 4.5 narrow plate was removed. The screws were all noted to be loose. There is no sign of infection. After debriding the bone ends back to her original bone and reestablishing intramedullary canal on both sides of the fracture, the fracture was re fixed with the same 4.5 Synthes narrow a hole plate on the anteromedial surface of the humerus instead of the anterolateral surface. Great care was taken to protect the radial nerve throughout the procedure excellent compression was achieved the fracture site and local bone graft was used as well as a large pack of Infuse around the fracture site the  Estimated Blood Loss:  400         Drains: none  Blood products Given: One unit of platelets was given  intraoperatively         Specimens: none        Complications:  * No complications entered in OR log *         Disposition: PACU - hemodynamically stable.         Condition: stable    Procedure:  DESCRIPTION OF PROCEDURE: The patient was identified in preoperative  holding area where I personally marked the operative site after  verifying site, side, and procedure with the patient. The patient was taken back  to the operating room where general anesthesia was induced without  complication and was placed in the supine position. The left arm was placed on a radiolucent hand table. A tourniquet was used. The left upper extremity was prepped and draped in the standard sterile fashion. The previously marked out incision was opened sharply. Dissection was carried down through subcutaneous tissue to the level of the fascia which was split in the previous scar plane. The brachialis was split down to the level of the plate exposing the previous plate. Dissection was carried around the medial and lateral surface of the humerus taking care to stay directly on the bone. Subperiosteal dissection was carried around. Retractors were carefully placed subperiosteally. The plate and screws were removed. The screws were noted to be loose. The plate was carefully inspected and noted to be in very good shape. There was no sign of fatigue or wear. After removal of the plate a Cobb elevator was used over the previous holes removed excess tissue and get down to the native bone. The holes were curetted. The proximal and distal ends of the fracture were then  carefully exposed and the nonunion was taken down. Again great care was taken to protect radial nerve posteriorly. The new bone formation was removed and saved as bone graft. The intramedullary canal was reestablished proximally and distally with a 4.5 drill.  The wound is copiously irrigated with normal saline. Is no sign of infection. The fracture was then aligned in  the 4.5 a hole microplate was placed on the anteromedial surface about 90 to the initial placement of the plate. It was fixed distally with a nonlocking 4.5 mm screw with excellent fixation. Then holding the fracture line with a clamp 2 proximal nonlocking screws were placed in compression mode providing excellent compression at the fracture site. Fluoroscopic imaging demonstrated appropriate position of the plate and good compression at the fracture site. The remaining holes were drilled measured and filled with the appropriate size 4.5 screws. The bone graft which had been morcellized was then packed in to the interstices of the fracture site which were not compressed. The large pack of infuse was then prepared and packed in around the fracture site. The deep fascial layer was closed loosely with 0 Vicryl and then the skin was closed with 2-0 Vicryl in a deep dermal layer and 4-0 Monocryl for skin closure. Steri-Strips were applied. Sterile dressing was applied including 4 x 4's ABDs Kerlix and an Ace bandage. Patient was allowed to awaken from general anesthesia transferred to stretcher and taken to the recovery room in stable condition.  Postoperative plan:  she will be kept in house for postoperative pain management and antibiotics. We will watch her wound closely and if she needs further transfusion of platelets she will have access to that. She will likely stay 1 to 2 nights.

## 2011-12-12 ENCOUNTER — Encounter (HOSPITAL_COMMUNITY): Payer: Self-pay | Admitting: General Practice

## 2011-12-12 LAB — CBC
MCH: 29.5 pg (ref 26.0–34.0)
MCHC: 32.6 g/dL (ref 30.0–36.0)
Platelets: 71 10*3/uL — ABNORMAL LOW (ref 150–400)
RDW: 16.7 % — ABNORMAL HIGH (ref 11.5–15.5)

## 2011-12-12 LAB — BASIC METABOLIC PANEL
Calcium: 9.1 mg/dL (ref 8.4–10.5)
GFR calc Af Amer: >90 mL/min (ref >90)
GFR calc non Af Amer: 87 mL/min — ABNORMAL LOW (ref >90)
Sodium: 141 mEq/L (ref 135–145)

## 2011-12-12 LAB — PREPARE PLATELET PHERESIS: Unit division: 0

## 2011-12-12 NOTE — Progress Notes (Signed)
UR COMPLETED  

## 2011-12-12 NOTE — Anesthesia Postprocedure Evaluation (Signed)
  Anesthesia Post-op Note  Patient: [name]  Procedure(s) Performed: Procedure(s) (LRB) with comments: OPEN REDUCTION INTERNAL FIXATION (ORIF) HUMERAL SHAFT FRACTURE (Left) -  Removal of Hardware left humerus, Open Reduction Internal Fixation Left Humeral Shaft Fracture Non-Union  Patient Location: Nursing Unit  Anesthesia Type: General  Level of Consciousness: awake, alert , oriented and patient cooperative  Airway and Oxygen Therapy: Patient Spontanous Breathing  Post-op Pain: none  Post-op Assessment: Post-op Vital signs reviewed, Patient's Cardiovascular Status Stable, Respiratory Function Stable, Patent Airway, No signs of Nausea or vomiting, Adequate PO intake and Pain level controlled  Post-op Vital Signs: Reviewed and stable  Complications: No apparent anesthesia complications

## 2011-12-12 NOTE — Progress Notes (Signed)
Occupational Therapy Evaluation Patient Details Name: [name] MRN: [id number] DOB: May 28, 1945 Today's Date: 12/12/2011 Time: 1324-4010 OT Time Calculation (min): 40 min  OT Assessment / Plan / Recommendation Clinical Impression  Pt. 66 yo female s/p ORIF humeral shaft fx. Educated pt. on sling wear, ADL's and exercises for elbow wrist and fingers with return demonstration. Pt. is supervision for ADL's at sink level but requires (A) with dressing and bathing LUE. Pt. is requesting home health to come out to her home at d/c. No further acute OT needs.     OT Assessment  Patient does not need any further OT services    Follow Up Recommendations  Supervision - Intermittent    Barriers to Discharge      Equipment Recommendations  None recommended by OT    Recommendations for Other Services    Frequency       Precautions / Restrictions Restrictions Weight Bearing Restrictions: No   Pertinent Vitals/Pain Pain 7/10, RN notified.     ADL  Grooming: Performed;Wash/dry hands;Supervision/safety Where Assessed - Grooming: Unsupported standing Upper Body Bathing: Performed;Left arm;Maximal assistance Where Assessed - Upper Body Bathing: Unsupported sitting Upper Body Dressing: Performed;Minimal assistance Where Assessed - Upper Body Dressing: Unsupported sitting Toilet Transfer: Performed;Modified independent Toilet Transfer Method: Sit to stand Toilet Transfer Equipment: Raised toilet seat with arms (or 3-in-1 over toilet) Toileting - Clothing Manipulation and Hygiene: Performed;Independent Where Assessed - Toileting Clothing Manipulation and Hygiene: Sit to stand from 3-in-1 or toilet Equipment Used: Gait belt Transfers/Ambulation Related to ADLs: Pt. is supervision for transfers and ambulation in room  ADL Comments: Educated pt on proper sling wear and how to don/doff, as well as ROM exercises for elbow, wrist and fingers. Pt. completed UB dressing with min (A) for the LUE.  Able to perform basic ADL's with supervision at sink level.     OT Diagnosis:    OT Problem List:   OT Treatment Interventions:     OT Goals    Visit Information  Last OT Received On: 12/12/11 Assistance Needed: +1    Subjective Data  Subjective: I would really like to have therapy come out to my house, because I live so far out of the way.  Patient Stated Goal: To go home    Prior Functioning     Home Living Lives With: Alone Available Help at Discharge: Available PRN/intermittently;Family Type of Home: House Home Access: Stairs to enter [employer] of Steps: 3-4 Entrance Stairs-Rails: None Home Layout: One level Bathroom Shower/Tub: Tub/shower unit;Walk-in Stage manager: Standard Home Adaptive Equipment: Paediatric nurse with back;Quad cane;Straight cane Prior Function Level of Independence: Independent Able to Take Stairs?: Yes Driving: Yes Vocation: Retired Musician: No difficulties Dominant Hand: Left         Vision/Perception     Cognition  Overall Cognitive Status: Appears within functional limits for tasks assessed/performed Arousal/Alertness: Awake/alert Orientation Level: Appears intact for tasks assessed Behavior During Session: [facility] for tasks performed    Extremity/Trunk Assessment Right Upper Extremity Assessment RUE ROM/Strength/Tone: [facility] for tasks assessed Trunk Assessment Trunk Assessment: Normal     Mobility Bed Mobility Bed Mobility: Supine to Sit;Sit to Supine Supine to Sit: 6: Modified independent (Device/Increase time);With rails;HOB elevated Sit to Supine: 6: Modified independent (Device/Increase time);With rail;HOB elevated Transfers Transfers: Sit to Stand;Stand to Sit Sit to Stand: 5: Supervision;From chair/3-in-1;From bed;With upper extremity assist Stand to Sit: 5: Supervision;With upper extremity assist;To bed;To chair/3-in-1     Shoulder Instructions Donning/doffing shirt without  moving shoulder: Minimal assistance;Patient able to independently direct caregiver Method for sponge bathing under operated UE: Maximal assistance;Patient able to independently direct caregiver Donning/doffing sling/immobilizer: Supervision/safety Correct positioning of sling/immobilizer: Independent ROM for elbow, wrist and digits of operated UE: Independent Sling wearing schedule (on at all times/off for ADL's): Independent Proper positioning of operated UE when showering: Independent Positioning of UE while sleeping: Independent   Exercise Shoulder Exercises Elbow Flexion: AROM;Seated Elbow Extension: AROM;Seated Wrist Flexion: AROM;Seated Wrist Extension: AROM;Seated Digit Composite Flexion: AROM;Seated Composite Extension: AROM;Seated Neck Flexion: AROM;Seated Neck Extension: AROM;Seated Neck Lateral Flexion - Right: AROM;Seated Neck Lateral Flexion - Left: AROM;Seated   Balance     End of Session OT - End of Session Equipment Utilized During Treatment: Gait belt Activity Tolerance: Patient tolerated treatment well Patient left: in bed;with call bell/phone within reach Nurse Communication: Mobility status;Patient requests pain meds  GO     [name] 12/12/2011, 9:00 AM

## 2011-12-12 NOTE — Progress Notes (Signed)
I agree with the following treatment note after reviewing documentation.   Johnston, Shanquita Ronning Brynn   OTR/L Pager: [phone] Office: [phone] .   

## 2011-12-12 NOTE — Progress Notes (Signed)
PATIENT ID: [name]   1 Day Post-Op Procedure(s) (LRB): OPEN REDUCTION INTERNAL FIXATION (ORIF) HUMERAL SHAFT FRACTURE (Left)  Subjective: Feeling well today. Slept well last night. Pain is well controlled.   Objective:  Filed Vitals:   12/12/11 0416  BP: 120/52  Pulse: 91  Temp: 99 F (37.2 C)  Resp: 16     L UE dressings c/d/i, Ace bandage intact, in sling Fingers with moderate swelling, wiggles fingers well, intact sensation to light touch   Labs:   [facility] 12/12/11 0555  HGB 11.0*   Basename 12/12/11 0555  WBC 6.8  RBC 3.73*  HCT 33.7*  PLT 71*   Basename 12/12/11 0555  NA 141  K 4.2  CL 106  CO2 27  BUN 15  CREATININE 0.79  GLUCOSE 95  CALCIUM 9.1    Assessment and Plan: Thrombocytopenia- chronic, transfused platelets intraoperatively, now baseline, will continue to monitor Pain well controlled, cont current pain mgmt Needs IV pain medications, will need to stay for one more night OT to see her today Likely discharge home tomorrow   VTE proph: SCDs

## 2011-12-12 NOTE — Progress Notes (Signed)
I agree with the following treatment note after reviewing documentation.   Johnston, Reba Hulett Brynn   OTR/L Pager: [phone] Office: [phone] .   

## 2011-12-12 NOTE — Progress Notes (Signed)
Occupational Therapy Discharge Patient Details Name: [name] MRN: [id number] DOB: 1945/06/02 Today's Date: 12/12/2011 Time: 3086-5784 OT Time Calculation (min): 40 min  Patient discharged from OT services secondary to Pt. educated on sling wear, ADL's, and exercises of the elbow, wrist and fingers with return demonstration. Pt. is supervision for BADL's at sink level with (A) for LUE bathing and dressing.   Please see latest therapy progress note for current level of functioning and progress toward goals.    Progress and discharge plan discussed with patient and/or caregiver: Patient/Caregiver agrees with plan  GO     [name] 12/12/2011, 9:01 AM

## 2011-12-13 LAB — CBC
HCT: 30.9 % — ABNORMAL LOW (ref 36.0–46.0)
Hemoglobin: 10.2 g/dL — ABNORMAL LOW (ref 12.0–15.0)
MCH: 29.8 pg (ref 26.0–34.0)
MCHC: 33 g/dL (ref 30.0–36.0)
MCV: 90.4 fL (ref 78.0–100.0)
Platelets: 63 10*3/uL — ABNORMAL LOW (ref 150–400)
RBC: 3.42 MIL/uL — ABNORMAL LOW (ref 3.87–5.11)
RDW: 16.4 % — ABNORMAL HIGH (ref 11.5–15.5)
WBC: 6.2 10*3/uL (ref 4.0–10.5)

## 2011-12-13 LAB — TYPE AND SCREEN: Unit division: 0

## 2011-12-13 NOTE — Progress Notes (Signed)
PATIENT ID: [name]   2 Days Post-Op Procedure(s) (LRB): OPEN REDUCTION INTERNAL FIXATION (ORIF) HUMERAL SHAFT FRACTURE (Left)  Subjective: Pain reports in some pain last night. Is in minimal pain this am. Reports feeling dizzy and weak. Asks nursing to assist her to bathroom. Reports decreased appetite, still only taking in liquids but is going to try more solid food today. Denies nausea or vomiting.   Objective:  Filed Vitals:   12/13/11 0526  BP: 115/45  Pulse: 84  Temp: 98.2 F (36.8 C)  Resp: 18     L UE dressings saturated, changed today. Incision appears benign, steris intact. Site oozing with blood. L UE with moderate swelling, distally n/v/i Wiggles fingers, bends at elbow.   Labs:   [facility] 12/12/11 0555  HGB 11.0*   Basename 12/12/11 0555  WBC 6.8  RBC 3.73*  HCT 33.7*  PLT 71*   Basename 12/12/11 0555  NA 141  K 4.2  CL 106  CO2 27  BUN 15  CREATININE 0.79  GLUCOSE 95  CALCIUM 9.1    Assessment and Plan: Weak and dizzy- talked with nursing, had to assist with ambulation to the bathroom and appeared unsteady, patient lives independently and has no home care  Will not dc today  Will consult PT for eval and treat  Will check platelet results this am, may possibly need another transfusion Dressings changed- was quite oozy, again will check platelets  Continue current pain mgmt Plan on dc home tomorrow  VTE proph: SCDs

## 2011-12-13 NOTE — Evaluation (Signed)
Physical Therapy Evaluation Patient Details Name: [name] MRN: [id number] DOB: 1946-02-28 Today's Date: 12/13/2011 Time: 4098-1191 PT Time Calculation (min): 16 min  PT Assessment / Plan / Recommendation Clinical Impression  Pt is a 66 y/o female s/p L humeral fracture.  Pt demonstrates safety and independence with mobility. No further PT needs at this time.      PT Assessment  All further PT needs can be met in the next venue of care (OPPT when cleared for L UE rehab. )    Follow Up Recommendations  Outpatient PT    Does the patient have the potential to tolerate intense rehabilitation      Barriers to Discharge        Equipment Recommendations  None recommended by PT    Recommendations for Other Services     Frequency      Precautions / Restrictions Precautions Precautions: Fall Required Braces or Orthoses: Other Brace/Splint Other Brace/Splint: Sling on L UE Restrictions Weight Bearing Restrictions: No   Pertinent Vitals/Pain No c/o pain.        Mobility  Bed Mobility Bed Mobility: Supine to Sit;Sit to Supine Supine to Sit: 6: Modified independent (Device/Increase time);With rails;HOB elevated Sit to Supine: 6: Modified independent (Device/Increase time);With rail;HOB elevated Transfers Sit to Stand: 5: Supervision;From chair/3-in-1;From bed;With upper extremity assist Stand to Sit: 5: Supervision;With upper extremity assist;To bed;To chair/3-in-1 Ambulation/Gait Ambulation/Gait Assistance: 5: Supervision Ambulation Distance (Feet): 150 Feet Assistive device: None Ambulation/Gait Assistance Details: Supervision for safety.   Gait Pattern: Within Functional Limits Stairs: Yes Stairs Assistance: 5: Supervision Stairs Assistance Details (indicate cue type and reason): supervision for safety Stair Management Technique: One rail Right Number of Stairs: 4  Wheelchair Mobility Wheelchair Mobility: No    Shoulder Instructions     Exercises     PT  Diagnosis:    PT Problem List:   PT Treatment Interventions:     PT Goals    Visit Information  Last PT Received On: 12/13/11 Assistance Needed: +1    Subjective Data  Subjective: agree to eval Patient Stated Goal: return to hom   Prior Functioning  Home Living Lives With: Alone Available Help at Discharge: Available PRN/intermittently;Family Type of Home: House Home Access: Stairs to enter [employer] of Steps: 3-4 Entrance Stairs-Rails: None Home Layout: One level Bathroom Shower/Tub: Tub/shower unit;Walk-in Stage manager: Standard Home Adaptive Equipment: Shower chair with back;Quad cane;Straight cane Prior Function Level of Independence: Independent Able to Take Stairs?: Yes Driving: Yes Vocation: Retired Musician: No difficulties Dominant Hand: Left    Cognition  Overall Cognitive Status: Appears within functional limits for tasks assessed/performed Arousal/Alertness: Awake/alert Orientation Level: Appears intact for tasks assessed Behavior During Session: [facility] for tasks performed    Extremity/Trunk Assessment Right Upper Extremity Assessment RUE ROM/Strength/Tone: [facility] for tasks assessed Left Upper Extremity Assessment LUE ROM/Strength/Tone: Unable to fully assess;Due to pain;Due to precautions Right Lower Extremity Assessment RLE ROM/Strength/Tone: Within functional levels Left Lower Extremity Assessment LLE ROM/Strength/Tone: Within functional levels Trunk Assessment Trunk Assessment: Normal   Balance Balance Balance Assessed: Yes Static Standing Balance Static Standing - Balance Support: No upper extremity supported Static Standing - Level of Assistance: 7: Independent  End of Session PT - End of Session Equipment Utilized During Treatment: Gait belt Activity Tolerance: Patient tolerated treatment well Patient left: in bed;with call bell/phone within reach Nurse Communication: Mobility status  GP      Maddyn Lieurance 12/13/2011, 5:36 PM  Dina Warbington L. Atreyu Mak DPT [phone]

## 2011-12-14 ENCOUNTER — Encounter (HOSPITAL_COMMUNITY): Payer: Self-pay | Admitting: Orthopedic Surgery

## 2011-12-14 DIAGNOSIS — D696 Thrombocytopenia, unspecified: Secondary | ICD-10-CM | POA: Diagnosis present

## 2011-12-14 DIAGNOSIS — S42309K Unspecified fracture of shaft of humerus, unspecified arm, subsequent encounter for fracture with nonunion: Secondary | ICD-10-CM | POA: Diagnosis present

## 2011-12-14 DIAGNOSIS — D62 Acute posthemorrhagic anemia: Secondary | ICD-10-CM | POA: Diagnosis not present

## 2011-12-14 LAB — CBC
HCT: 30.3 % — ABNORMAL LOW (ref 36.0–46.0)
RDW: 15.9 % — ABNORMAL HIGH (ref 11.5–15.5)
WBC: 4.3 10*3/uL (ref 4.0–10.5)

## 2011-12-14 MED ORDER — OXYCODONE HCL 10 MG PO TABS: ORAL_TABLET | ORAL | Status: DC

## 2011-12-14 NOTE — Discharge Summary (Signed)
Patient stayed extra days to follow wound and platelets.  Also had increased pain med requirements initially.  She had some lightheadedness, but Hgb was stable.  Worked with PT and was deemed safe for d/c home.  At time of d/c wound was dry and hgb was stable

## 2011-12-14 NOTE — Progress Notes (Signed)
PATIENT ID: [name]   3 Days Post-Op Procedure(s) (LRB): OPEN REDUCTION INTERNAL FIXATION (ORIF) HUMERAL SHAFT FRACTURE (Left)  Subjective: Feeling much better. No dizziness, lightheadedness. Eating a full meal. Reports moderate pain last night, improved this morning. Did well with PT yesterday. Ready to go home.    Objective:  Filed Vitals:   12/14/11 0508  BP: 134/67  Pulse: 86  Temp: 97.9 F (36.6 C)  Resp: 18     L UE dressing changed today, no sign of new bleeding. Incision appears benign. Steri's intact.  L UE with mod swelling. No erythema or warmth.  Wiggles fingers, intact sensation to light touch.   Labs:   [facility] 12/14/11 0555 12/13/11 0855 12/12/11 0555  HGB 9.9* 10.2* 11.0*   Basename 12/14/11 0555 12/13/11 0855  WBC 4.3 6.2  RBC 3.37* 3.42*  HCT 30.3* 30.9*  PLT 52* 63*   Basename 12/12/11 0555  NA 141  K 4.2  CL 106  CO2 27  BUN 15  CREATININE 0.79  GLUCOSE 95  CALCIUM 9.1    Assessment and Plan: D/c home today Worked with PT, no home health needed Chronic thrombocytopenia- asymptomatic, no transfusion of platelets ABLA- expected post operatively, asymptomatic  Hypotension- improving in the last 12 hours, asymptomatic Discharge with Fentanyl patch, oxycodone 10mg  1-2 po a 4-6 hrs prn pain Fu [address] Filter in 10 days   VTE proph: SCDs, no pharmacologic due to chronic thrombocytopenia

## 2011-12-14 NOTE — Discharge Summary (Signed)
Patient ID: WALBURGA HUDMAN MRN: [id number] DOB/AGE: 10-Oct-1945 66 y.o.  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 Hypertension   . Restless leg   . Heart murmur   . Shortness of breath     with exertion   . Sleep apnea 2013    sleep study at [facility]    uses cpap  . Diabetes mellitus     no meds  . 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  platelets  low... wbc also low per pt ..   . Blood dyscrasia     low platelets ... low wbc.  ? liver  disease.   . H/O hiatal hernia   . Headache   . Anxiety   . Depression   . Chronic kidney disease 50's    nephritis--none at present *12/03/2011)  . Cancer 2005    left breast  . Platelets decreased     ??   D/T CIRRHOISIS  . Family history of anesthesia complication     Daughter has complications with Versed    Surgeries: Procedure(s): OPEN REDUCTION INTERNAL FIXATION (ORIF) HUMERAL SHAFT FRACTURE on 12/11/2011   Consultants:    Discharged Condition: Improved  Hospital Course: ROXY MASTANDREA is an 66 y.o. female who was admitted 12/11/2011 for operative treatment of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. Patient has severe unremitting pain that affects sleep, daily activities, and work/hobbies. After pre-op clearance the patient was taken to the operating room on 12/11/2011 and underwent  Procedure(s): OPEN REDUCTION INTERNAL FIXATION (ORIF) HUMERAL SHAFT FRACTURE.    Patient was given perioperative antibiotics: Anti-infectives     Start     Dose/Rate Route Frequency Ordered Stop   12/11/11 2200   vancomycin (VANCOCIN) IVPB 1000 mg/200 mL premix        1,000 mg 200 mL/hr over 60 Minutes Intravenous Every 12 hours 12/11/11 1543 12/11/11 2325   12/10/11 1348   clindamycin (CLEOCIN) IVPB 900  mg        900 mg 100 mL/hr over 30 Minutes Intravenous 60 min pre-op 12/10/11 1348 12/11/11 1109           Patient was given sequential compression devices and early ambulation to prevent DVT.  Patient benefited maximally from hospital stay and there were no complications.    Recent vital signs: Patient Vitals for the past 24 hrs:  BP Temp Pulse Resp SpO2  12/14/11 0508 134/67 mmHg 97.9 F (36.6 C) 86  18  100 %  January 04, 2012 2220 108/66 mmHg 98.1 F (36.7 C) 92  18  98 %  01/04/2012 1400 109/64 mmHg 98.5 F (36.9 C) 89  17  96 %  01-04-12 1007 134/52 mmHg - - - -     Recent laboratory studies:  Basename 12/14/11 0555 2012/01/04 0855 12/12/11 0555  WBC 4.3 6.2 --  HGB 9.9* 10.2* --  HCT 30.3* 30.9* --  PLT 52* 63* --  NA -- -- 141  K -- -- 4.2  CL -- -- 106  CO2 -- -- 27  BUN -- -- 15  CREATININE -- -- 0.79  GLUCOSE -- -- 95  INR -- -- --  CALCIUM -- -- 9.1     Discharge Medications:     Medication List     As  of 12/14/2011  8:25 AM    STOP taking these medications         HYDROcodone-acetaminophen 10-325 MG per tablet   Commonly known as: NORCO      TAKE these medications         cholecalciferol 1000 UNITS tablet   Commonly known as: VITAMIN D   Take 1,000 Units by mouth 2 (two) times daily.      diazepam 5 MG tablet   Commonly known as: VALIUM   Take 5 mg by mouth every 6 (six) hours as needed. Anxiety      fentaNYL 75 MCG/HR   Commonly known as: DURAGESIC - dosed mcg/hr   Place 1 patch onto the skin every 3 (three) days.      furosemide 20 MG tablet   Commonly known as: LASIX   Take 20 mg by mouth every other day.      gabapentin 100 MG capsule   Commonly known as: NEURONTIN   Take 200 mg by mouth at bedtime.      lactulose 10 GM/15ML solution   Commonly known as: CHRONULAC   Take 45 g by mouth daily as needed. For constipation      lisinopril 10 MG tablet   Commonly known as: PRINIVIL,ZESTRIL   Take 10 mg by mouth daily.      metaxalone  800 MG tablet   Commonly known as: SKELAXIN   Take 800 mg by mouth 2 (two) times daily.      Oxycodone HCl 10 MG Tabs   Take 1-2 tablets every 4-6 hours as needed for pain.      pramipexole 1 MG tablet   Commonly known as: MIRAPEX   Take 1-2 mg by mouth at bedtime.      promethazine 12.5 MG tablet   Commonly known as: PHENERGAN   Take 12.5 mg by mouth every 6 (six) hours as needed. For nausea      spironolactone 50 MG tablet   Commonly known as: ALDACTONE   Take 50 mg by mouth daily.      WELLBUTRIN XL PO   Take 225 mg by mouth at bedtime.      zolpidem 10 MG tablet   Commonly known as: AMBIEN   Take 10 mg by mouth at bedtime as needed. For sleep        Diagnostic Studies: Ct Humerus Left Wo Contrast  11/16/2011  *RADIOLOGY REPORT*  Clinical Data: Right arm pain.  Evaluate humeral shaft fracture.  CT OF THE LEFT HUMERUS WITHOUT CONTRAST  Technique:  Multidetector CT imaging was performed according to the standard protocol. Multiplanar CT image reconstructions were also generated.  Comparison: Shoulder radiographs 06/19/2011.  Findings: There is a long plate and multiple screws on the the humeral shaft.  The midshaft humeral fracture is not united.  The plate and screws are intact.  No complicating features.  There is peripheral callus formation and heterotopic ossification but no substantial osseous bridging across the fracture.  The shoulder joint is maintained.  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   Original Report Authenticated By: P. Loralie Champagne, M.D.    Dg Humerus Left  12/11/2011  *RADIOLOGY REPORT*  Clinical Data: Nonunion humerus fracture  DG C-ARM 1-60 MIN,LEFT HUMERUS - 2+ VIEW  Comparison: Multiple priors  Findings: C-arm films document removal of the previous plate and placement of a new plate across a nonunion humerus fracture.  IMPRESSION: As above.   Original Report Authenticated By: Jackquline Denmark.  CURNES, M.D.    Dg C-arm 1-60 Min  12/11/2011   *RADIOLOGY REPORT*  Clinical Data: Nonunion humerus fracture  DG C-ARM 1-60 MIN,LEFT HUMERUS - 2+ VIEW  Comparison: Multiple priors  Findings: C-arm films document removal of the previous plate and placement of a new plate across a nonunion humerus fracture.  IMPRESSION: As above.   Original Report Authenticated By: Elsie Stain, M.D.     Disposition: 06-Home-Health Care Svc      Discharge Orders    Future Orders Please Complete By Expires   Diet - low sodium heart healthy      Call MD / Call 911      Comments:   If you experience chest pain or shortness of breath, CALL 911 and be transported to the hospital emergency room.  If you develope a fever above 101 F, pus (white drainage) or increased drainage or redness at the wound, or calf pain, call your surgeon's office.   Constipation Prevention      Comments:   Drink plenty of fluids.  Prune juice may be helpful.  You may use a stool softener, such as Colace (over the counter) 100 mg twice a day.  Use MiraLax (over the counter) for constipation as needed.   Increase activity slowly as tolerated      Driving restrictions      Comments:   No driving until cleared by physician.   Lifting restrictions      Comments:   No lifting with left arm until cleared by physician.      Follow-up Information    Follow up with Mable Paris, MD. Schedule an appointment as soon as possible for a visit in 10 days.   Contact information:   [facility] [address] [place] [address] [phone]           Signed: Jiles Harold 12/14/2011, 8:25 AM

## 2012-01-24 ENCOUNTER — Other Ambulatory Visit (HOSPITAL_COMMUNITY): Payer: Self-pay | Admitting: Endocrinology

## 2012-01-24 DIAGNOSIS — M545 Low back pain: Secondary | ICD-10-CM

## 2012-01-24 DIAGNOSIS — C50919 Malignant neoplasm of unspecified site of unspecified female breast: Secondary | ICD-10-CM

## 2012-01-28 ENCOUNTER — Encounter (HOSPITAL_COMMUNITY): Admission: RE | Admit: 2012-01-28 | Payer: Medicare Other | Source: Ambulatory Visit

## 2012-01-29 ENCOUNTER — Encounter (HOSPITAL_COMMUNITY): Payer: Medicare Other

## 2012-04-18 ENCOUNTER — Emergency Department (HOSPITAL_COMMUNITY): Payer: Medicare Other

## 2012-04-18 ENCOUNTER — Encounter (HOSPITAL_COMMUNITY): Payer: Self-pay | Admitting: Emergency Medicine

## 2012-04-18 ENCOUNTER — Emergency Department (HOSPITAL_COMMUNITY)
Admission: EM | Admit: 2012-04-18 | Discharge: 2012-04-18 | Disposition: A | Discharge status: Home / Self Care | Payer: Medicare Other | Attending: Emergency Medicine | Admitting: Emergency Medicine

## 2012-04-18 DIAGNOSIS — Z8679 Personal history of other diseases of the circulatory system: Secondary | ICD-10-CM | POA: Insufficient documentation

## 2012-04-18 DIAGNOSIS — E876 Hypokalemia: Secondary | ICD-10-CM | POA: Insufficient documentation

## 2012-04-18 DIAGNOSIS — D696 Thrombocytopenia, unspecified: Secondary | ICD-10-CM | POA: Insufficient documentation

## 2012-04-18 DIAGNOSIS — K7460 Unspecified cirrhosis of liver: Secondary | ICD-10-CM | POA: Insufficient documentation

## 2012-04-18 DIAGNOSIS — G4730 Sleep apnea, unspecified: Secondary | ICD-10-CM | POA: Insufficient documentation

## 2012-04-18 DIAGNOSIS — E119 Type 2 diabetes mellitus without complications: Secondary | ICD-10-CM | POA: Insufficient documentation

## 2012-04-18 DIAGNOSIS — Z8719 Personal history of other diseases of the digestive system: Secondary | ICD-10-CM | POA: Insufficient documentation

## 2012-04-18 DIAGNOSIS — Z9989 Dependence on other enabling machines and devices: Secondary | ICD-10-CM | POA: Insufficient documentation

## 2012-04-18 DIAGNOSIS — F411 Generalized anxiety disorder: Secondary | ICD-10-CM | POA: Insufficient documentation

## 2012-04-18 DIAGNOSIS — N390 Urinary tract infection, site not specified: Secondary | ICD-10-CM

## 2012-04-18 DIAGNOSIS — N189 Chronic kidney disease, unspecified: Secondary | ICD-10-CM | POA: Insufficient documentation

## 2012-04-18 DIAGNOSIS — Z8709 Personal history of other diseases of the respiratory system: Secondary | ICD-10-CM | POA: Insufficient documentation

## 2012-04-18 DIAGNOSIS — I129 Hypertensive chronic kidney disease with stage 1 through stage 4 chronic kidney disease, or unspecified chronic kidney disease: Secondary | ICD-10-CM | POA: Insufficient documentation

## 2012-04-18 DIAGNOSIS — G2581 Restless legs syndrome: Secondary | ICD-10-CM | POA: Insufficient documentation

## 2012-04-18 DIAGNOSIS — Z853 Personal history of malignant neoplasm of breast: Secondary | ICD-10-CM | POA: Insufficient documentation

## 2012-04-18 DIAGNOSIS — G8929 Other chronic pain: Secondary | ICD-10-CM | POA: Insufficient documentation

## 2012-04-18 DIAGNOSIS — Z862 Personal history of diseases of the blood and blood-forming organs and certain disorders involving the immune mechanism: Secondary | ICD-10-CM | POA: Insufficient documentation

## 2012-04-18 DIAGNOSIS — Z79899 Other long term (current) drug therapy: Secondary | ICD-10-CM | POA: Insufficient documentation

## 2012-04-18 DIAGNOSIS — F3289 Other specified depressive episodes: Secondary | ICD-10-CM | POA: Insufficient documentation

## 2012-04-18 DIAGNOSIS — F329 Major depressive disorder, single episode, unspecified: Secondary | ICD-10-CM | POA: Insufficient documentation

## 2012-04-18 LAB — CBC
HCT: 31 % — ABNORMAL LOW (ref 36.0–46.0)
Hemoglobin: 10.6 g/dL — ABNORMAL LOW (ref 12.0–15.0)
MCH: 28.8 pg (ref 26.0–34.0)
MCV: 84.2 fL (ref 78.0–100.0)
Platelets: 50 10*3/uL — ABNORMAL LOW (ref 150–400)
RBC: 3.68 MIL/uL — ABNORMAL LOW (ref 3.87–5.11)
WBC: 3 10*3/uL — ABNORMAL LOW (ref 4.0–10.5)

## 2012-04-18 LAB — COMPREHENSIVE METABOLIC PANEL WITH GFR
ALT: 15 U/L (ref 0–35)
AST: 33 U/L (ref 0–37)
Albumin: 3.3 g/dL — ABNORMAL LOW (ref 3.5–5.2)
Alkaline Phosphatase: 194 U/L — ABNORMAL HIGH (ref 39–117)
BUN: 10 mg/dL (ref 6–23)
CO2: 27 meq/L (ref 19–32)
Calcium: 8.9 mg/dL (ref 8.4–10.5)
Chloride: 108 meq/L (ref 96–112)
Creatinine, Ser: 0.67 mg/dL (ref 0.50–1.10)
GFR calc Af Amer: >90 mL/min
GFR calc non Af Amer: >90 mL/min
Glucose, Bld: 102 mg/dL — ABNORMAL HIGH (ref 70–99)
Potassium: 2.8 meq/L — ABNORMAL LOW (ref 3.5–5.1)
Sodium: 143 meq/L (ref 135–145)
Total Bilirubin: 1.3 mg/dL — ABNORMAL HIGH (ref 0.3–1.2)
Total Protein: 6.6 g/dL (ref 6.0–8.3)

## 2012-04-18 LAB — URINE MICROSCOPIC-ADD ON

## 2012-04-18 LAB — RAPID URINE DRUG SCREEN, HOSP PERFORMED
Amphetamines: NOT DETECTED
Barbiturates: NOT DETECTED
Benzodiazepines: NOT DETECTED
Cocaine: NOT DETECTED
Opiates: POSITIVE — AB
Tetrahydrocannabinol: NOT DETECTED

## 2012-04-18 LAB — URINALYSIS, ROUTINE W REFLEX MICROSCOPIC
Bilirubin Urine: NEGATIVE
Glucose, UA: NEGATIVE mg/dL
Nitrite: NEGATIVE
Specific Gravity, Urine: 1.015 (ref 1.005–1.030)
pH: 5.5 (ref 5.0–8.0)

## 2012-04-18 LAB — DIFFERENTIAL
Eosinophils Relative: 0 % (ref 0–5)
Lymphocytes Relative: 35 % (ref 12–46)
Lymphs Abs: 1.1 10*3/uL (ref 0.7–4.0)
Monocytes Absolute: 0.3 10*3/uL (ref 0.1–1.0)
Monocytes Relative: 9 % (ref 3–12)

## 2012-04-18 LAB — POCT I-STAT TROPONIN I: Troponin i, poc: 0.01 ng/mL (ref 0.00–0.08)

## 2012-04-18 LAB — TROPONIN I: Troponin I: <0.3 ng/mL (ref <0.30)

## 2012-04-18 MED ORDER — NITROFURANTOIN MONOHYD MACRO 100 MG PO CAPS: 100.0000 mg | ORAL_CAPSULE | Freq: Two times a day (BID) | ORAL | Status: DC

## 2012-04-18 MED ORDER — POTASSIUM CHLORIDE CRYS ER 20 MEQ PO TBCR
40.0000 meq | EXTENDED_RELEASE_TABLET | Freq: Once | ORAL | Status: AC
  Administered 2012-04-18: 40 meq via ORAL
  Filled 2012-04-18: qty 2

## 2012-04-18 MED ORDER — CIPROFLOXACIN HCL 500 MG PO TABS: 500.0000 mg | ORAL_TABLET | Freq: Two times a day (BID) | ORAL | Status: DC

## 2012-04-18 NOTE — ED Notes (Signed)
Patient advised that she has been treated for UTI recently.  Patient claims still having blood in her urine.   Patient further advised that "I have spells like this when my ammonia is high".

## 2012-04-18 NOTE — ED Provider Notes (Signed)
History     CSN: [id number]  Arrival date & time 04/18/12  4401   First MD Initiated Contact with Patient 04/18/12 [phone]      Chief Complaint  Patient presents with  . Altered Mental Status     Patient is a 67 y.o. female presenting with altered mental status. The history is provided by the patient.  Altered Mental Status This is a new problem. The current episode started more than 2 days ago. The problem occurs constantly. The problem has been gradually improving. Pertinent negatives include no chest pain, no abdominal pain and no shortness of breath. Nothing aggravates the symptoms. Nothing relieves the symptoms. She has tried rest for the symptoms.  pt reports she feels confused.  No falls reported.  No focal weakness.  No cp/sob.  No abd pain.  She reports chronic pain in her left UE.  No new medications.  No fevers reported.    Past Medical History  Diagnosis Date  . Hypertension   . Restless leg   . Heart murmur   . Shortness of breath     with exertion   . Sleep apnea 2013    sleep study at [facility]    uses cpap  . Diabetes mellitus     no meds  . 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  platelets  low... wbc also low per pt ..   . Blood dyscrasia     low platelets ... low wbc.  ? liver  disease.   . H/O hiatal hernia   . Headache   . Anxiety   . Depression   . Chronic kidney disease 50's    nephritis--none at present *12/03/2011)  . Cancer 2005    left breast  . Platelets decreased     ??   D/T CIRRHOISIS  . Family history of anesthesia complication     Daughter has complications with Versed    Past Surgical History  Procedure Laterality Date  . Cholecystectomy    . Appendectomy    . Tonsillectomy    . Abdominal hysterectomy    . Cardiac catheterization  1975    normal cath  -low potassium  . Orif humerus fracture  06/19/2011    Procedure: OPEN REDUCTION INTERNAL FIXATION (ORIF) DISTAL HUMERUS FRACTURE;  Surgeon: Mable Paris, MD;  Location: [facility] OR;   Service: Orthopedics;  Laterality: Left;  . Breast surgery      left lumpectomy--few nodes removed  . Mastectomy    . Orif humerus fracture  12/11/2011    Procedure: OPEN REDUCTION INTERNAL FIXATION (ORIF) HUMERAL SHAFT FRACTURE;  Surgeon: Mable Paris, MD;  Location: [facility] OR;  Service: Orthopedics;  Laterality: Left;   Removal of Hardware left humerus, Open Reduction Internal Fixation Left Humeral Shaft Fracture Non-Union    Family History  Problem Relation Age of Onset  . Anesthesia problems Daughter     History  Substance Use Topics  . Smoking status: Never Smoker   . Smokeless tobacco: Never Used  . Alcohol Use: No    OB History   Grav Para Term Preterm Abortions TAB SAB Ect Mult Living                  Review of Systems  Respiratory: Negative for shortness of breath.   Cardiovascular: Negative for chest pain.  Gastrointestinal: Negative for abdominal pain.  Psychiatric/Behavioral: Positive for altered mental status.  All other systems reviewed and are negative.    Allergies  Penicillins; Aspirin; Midazolam hcl; and Niacin and related  Home Medications   Current Outpatient Rx  Name  Route  Sig  Dispense  Refill  . BuPROPion HCl (WELLBUTRIN XL PO)   Oral   Take 225 mg by mouth at bedtime.         . diazepam (VALIUM) 5 MG tablet   Oral   Take 5 mg by mouth every 6 (six) hours as needed. Anxiety         . fentaNYL (DURAGESIC - DOSED MCG/HR) 75 MCG/HR   Transdermal   Place 1 patch onto the skin every 3 (three) days.         [name] gabapentin (NEURONTIN) 100 MG capsule   Oral   Take 200 mg by mouth at bedtime.          [name] HYDROcodone-acetaminophen (NORCO/VICODIN) 5-325 MG per tablet   Oral   Take 2 tablets by mouth 2 (two) times daily.         [name] lactulose (CHRONULAC) 10 GM/15ML solution   Oral   Take 45 g by mouth daily as needed. For constipation         . lisinopril (PRINIVIL,ZESTRIL) 10 MG tablet   Oral   Take 10 mg by mouth daily as  needed (for fluid).          . metaxalone (SKELAXIN) 800 MG tablet   Oral   Take 800 mg by mouth daily as needed (for pain).          . Oxycodone HCl (ROXICODONE) 10 MG TABS      Take 1-2 tablets every 4-6 hours as needed for pain.   60 tablet   0   . pramipexole (MIRAPEX) 1 MG tablet   Oral   Take 1-2 mg by mouth at bedtime.          . promethazine (PHENERGAN) 12.5 MG tablet   Oral   Take 12.5 mg by mouth every 6 (six) hours as needed. For nausea         . spironolactone (ALDACTONE) 50 MG tablet   Oral   Take 50 mg by mouth daily.           . traZODone (DESYREL) 50 MG tablet   Oral   Take 50 mg by mouth at bedtime.         [name] zolpidem (AMBIEN) 10 MG tablet   Oral   Take 10 mg by mouth at bedtime as needed. For sleep           BP 154/62  Pulse 95  Temp(Src) 98.1 F (36.7 C) (Oral)  Resp 17  Ht 5\' 8"  (1.727 m)  Wt 215 lb (97.523 kg)  BMI 32.7 kg/m2  SpO2 100%  Physical Exam CONSTITUTIONAL: Well developed/well nourished HEAD AND FACE: Normocephalic/atraumatic EYES: EOMI/PERRL, no nystagmus ENMT: Mucous membranes moist NECK: supple no meningeal signs, no bruits SPINE:entire spine nontender CV: S1/S2 noted, no murmurs/rubs/gallops noted LUNGS: Lungs are clear to auscultation bilaterally, no apparent distress ABDOMEN: soft, nontender, no rebound or guarding Rectal - stool color normal, hemoccult negative, chaperone present GU:no cva tenderness NEURO:Awake/alert, facies symmetric, no arm or leg drift is noted Gait normal EXTREMITIES: pulses normal, full ROM, no deformity or signs of trauma to lower extremities SKIN: warm, color normal PSYCH: no abnormalities of mood noted   ED Course  Procedures   Labs Reviewed  ETHANOL  PROTIME-INR  APTT  CBC  DIFFERENTIAL  COMPREHENSIVE METABOLIC PANEL  TROPONIN I  URINE RAPID  DRUG SCREEN (HOSP PERFORMED)  URINALYSIS, ROUTINE W REFLEX MICROSCOPIC  AMMONIA   Pt presents with confusion . However she  is alert/oriented to person/place time.  She has no focal neuro deficits.  She is ambulatory.  Her brother arrived who reports when he saw her this morning she appeared confused but he thought it was due to hepatic encephalopathy which she has had before.  She does have uti and mild hypoKalemia.   I doubt acute CVA/ACS at this time.  She does not appear septic.  She mentioned feeling depressed to nurse but she seemed appropriate on my exam and did not appear depressed.  Her platelets are stable/unchanged.  I feel she is stable for d/c.  Suspect some of this may have been due to her pain meds.  Patient/family agreeable with plan  MDM  Nursing notes including past medical history and social history reviewed and considered in documentation xrays reviewed and considered Labs/vital reviewed and considered Previous records reviewed and considered - h/o thrombocytopenia        Date: 04/18/2012  Rate: 91  Rhythm: normal sinus rhythm  QRS Axis: normal  Intervals: normal  ST/T Wave abnormalities: nonspecific ST changes  Conduction Disutrbances:nonspecific IV conduction delay  Narrative Interpretation: PVC noted  Old EKG Reviewed: none available at time of interpretation    Joya Gaskins, MD 04/18/12 1659

## 2012-04-18 NOTE — ED Notes (Addendum)
ED-P in room at this time speaking with pt and pt brother

## 2012-04-18 NOTE — ED Notes (Signed)
NAD noted at time of d/c home 

## 2012-04-18 NOTE — ED Notes (Signed)
Per EMS, patient called for them because "I don't feel well".   Patient claims she has been confused x 3 days as far as dates are concerned.  Patient claims that she has been depressed because her husband died last year close to Valentines Day.   Patient advised that her L arm has been broken twice and she is not able to use.  Patient claims that is baseline.

## 2012-04-19 LAB — URINE CULTURE

## 2012-04-30 ENCOUNTER — Other Ambulatory Visit: Payer: Self-pay | Admitting: Orthopedic Surgery

## 2012-04-30 ENCOUNTER — Ambulatory Visit
Admission: RE | Admit: 2012-04-30 | Discharge: 2012-04-30 | Disposition: A | Discharge status: Home / Self Care | Payer: Medicare Other | Source: Ambulatory Visit | Attending: Orthopedic Surgery | Admitting: Orthopedic Surgery

## 2012-04-30 DIAGNOSIS — R52 Pain, unspecified: Secondary | ICD-10-CM

## 2012-10-24 ENCOUNTER — Encounter: Payer: Self-pay | Admitting: *Deleted

## 2012-10-28 ENCOUNTER — Encounter: Payer: Self-pay | Admitting: Cardiovascular Disease

## 2012-10-29 ENCOUNTER — Encounter: Payer: Self-pay | Admitting: Cardiovascular Disease

## 2012-10-29 ENCOUNTER — Ambulatory Visit (INDEPENDENT_AMBULATORY_CARE_PROVIDER_SITE_OTHER): Payer: Medicare Other | Admitting: Cardiovascular Disease

## 2012-10-29 VITALS — BP 150/68 | HR 74 | Ht 68.0 in | Wt 212.2 lb

## 2012-10-29 DIAGNOSIS — I341 Nonrheumatic mitral (valve) prolapse: Secondary | ICD-10-CM

## 2012-10-29 DIAGNOSIS — K7460 Unspecified cirrhosis of liver: Secondary | ICD-10-CM

## 2012-10-29 DIAGNOSIS — I10 Essential (primary) hypertension: Secondary | ICD-10-CM

## 2012-10-29 DIAGNOSIS — I471 Supraventricular tachycardia, unspecified: Secondary | ICD-10-CM

## 2012-10-29 DIAGNOSIS — I059 Rheumatic mitral valve disease, unspecified: Secondary | ICD-10-CM

## 2012-10-29 DIAGNOSIS — I491 Atrial premature depolarization: Secondary | ICD-10-CM

## 2012-10-29 DIAGNOSIS — G4733 Obstructive sleep apnea (adult) (pediatric): Secondary | ICD-10-CM

## 2012-10-29 NOTE — Patient Instructions (Addendum)
Your physician recommends that you schedule a follow-up appointment in: 6 Months  

## 2012-11-01 DIAGNOSIS — I341 Nonrheumatic mitral (valve) prolapse: Secondary | ICD-10-CM | POA: Insufficient documentation

## 2012-11-01 DIAGNOSIS — K7460 Unspecified cirrhosis of liver: Secondary | ICD-10-CM | POA: Insufficient documentation

## 2012-11-01 DIAGNOSIS — I10 Essential (primary) hypertension: Secondary | ICD-10-CM | POA: Insufficient documentation

## 2012-11-01 DIAGNOSIS — G4733 Obstructive sleep apnea (adult) (pediatric): Secondary | ICD-10-CM | POA: Insufficient documentation

## 2012-11-01 DIAGNOSIS — I471 Supraventricular tachycardia: Secondary | ICD-10-CM | POA: Insufficient documentation

## 2012-11-01 NOTE — Assessment & Plan Note (Signed)
Not confirmed on most recent echo study.

## 2012-11-01 NOTE — Assessment & Plan Note (Signed)
No recent complaint 

## 2012-11-01 NOTE — Assessment & Plan Note (Signed)
Some of her sleep difficulty may be related to subtle hepatic encephalopathy

## 2012-11-01 NOTE — Progress Notes (Addendum)
Patient ID: [name], female   DOB: 13-Jul-1945, 67 y.o.   MRN: [id number]     Reason for office visit SVT, mitral valve prolapse  This is my first encounter with [name] who was previously cared for by Dr. Caprice Kluver. 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 She also has non-alcoholic cirrhosis, of uncertain etiology. She has systemic hypertension and her blood pressure is often high in the office although it is normal at home. She was diagnosed as having mitral valve prolapse in the past but her most recent echocardiogram in January 2013 did not describe this abnormality although there was mild mitral insufficiency.  She has a variety of complaints that did not appear in general to be associated with cardiac illness. Her biggest complaints are related to sleeping difficulties, with difficulty sleeping at night and somnolence during the day. Has very rare palpitations. Does experience dizziness but this does not appear to be associated with the palpitations. She does not have lower extremity edema, dyspnea, chest pain or intermittent claudication.    Allergies  Allergen Reactions  . Penicillins Anaphylaxis, Itching and Swelling  . Aspirin     Has liver disease  . Midazolam Hcl Other (See Comments)    Daughter has allergy, doctors advised to warn against using.Marland KitchenMarland KitchenPropofol is okay to use.  . Niacin And Related Itching    Feels like skin is on fire    Current Outpatient Prescriptions  Medication Sig Dispense Refill  . BuPROPion HCl (WELLBUTRIN XL PO) Take 225 mg by mouth at bedtime.      . diazepam (VALIUM) 5 MG tablet Take 5 mg by mouth every 6 (six) hours as needed. Anxiety      . fentaNYL (DURAGESIC - DOSED MCG/HR) 75 MCG/HR Place 1 patch onto the skin every 3 (three) days.      [name] gabapentin (NEURONTIN) 100 MG capsule Take 200 mg by mouth at bedtime.       [name]  HYDROcodone-acetaminophen (NORCO/VICODIN) 5-325 MG per tablet Take 2 tablets by mouth 2 (two) times daily.      [name] lactulose (CHRONULAC) 10 GM/15ML solution Take 45 g by mouth daily as needed. For constipation      . pramipexole (MIRAPEX) 1 MG tablet Take 1-2 mg by mouth at bedtime.       . promethazine (PHENERGAN) 12.5 MG tablet Take 12.5 mg by mouth every 6 (six) hours as needed. For nausea      . traZODone (DESYREL) 50 MG tablet Take 50 mg by mouth at bedtime.      . potassium chloride SA (K-DUR,KLOR-CON) 20 MEQ tablet Take 20 mEq by mouth as needed. Take 20 mEq by mouth Two (2) times a day.      . zolpidem (AMBIEN) 10 MG tablet Take 10 mg by mouth at bedtime as needed. For sleep       No current facility-administered medications for this visit.    Past Medical History  Diagnosis Date  . Hypertension   . Restless leg   . Heart murmur   . Shortness of breath     with exertion   . Sleep apnea 2013    sleep study at [facility]    uses cpap  . Diabetes mellitus     no meds  . 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  platelets  low... wbc also low per pt .[name]   [name]  Blood dyscrasia     low platelets ... low wbc.  ? liver  disease.   . H/O hiatal hernia   . Headache(784.0)   . Anxiety   . Depression   . Chronic kidney disease 50's    nephritis--none at present *12/03/2011)  . Cancer 2005    left breast  . Platelets decreased     ??   D/T CIRRHOISIS  . Family history of anesthesia complication     Daughter has complications with Versed  . PSVT (paroxysmal supraventricular tachycardia)     Past Surgical History  Procedure Laterality Date  . Cholecystectomy    . Appendectomy    . Tonsillectomy    . Abdominal hysterectomy    . Cardiac catheterization  1975    normal cath  -low potassium  . Orif humerus fracture  06/19/2011    Procedure: OPEN REDUCTION INTERNAL FIXATION (ORIF) DISTAL HUMERUS FRACTURE;  Surgeon: Mable Paris, MD;  Location: [facility] OR;  Service: Orthopedics;   Laterality: Left;  . Breast surgery      left lumpectomy--few nodes removed  . Mastectomy    . Orif humerus fracture  12/11/2011    Procedure: OPEN REDUCTION INTERNAL FIXATION (ORIF) HUMERAL SHAFT FRACTURE;  Surgeon: Mable Paris, MD;  Location: [facility] OR;  Service: Orthopedics;  Laterality: Left;   Removal of Hardware left humerus, Open Reduction Internal Fixation Left Humeral Shaft Fracture Non-Union  . US echocardiography  03/16/2011    mild concentric LVH,mod LAE,stage I diastolic dysfunction,MAC,mild MR,aortic sclerosis,mild TR  . Nm myocar perf wall motion  08/17/2008    mild to mod. superimposed ischemiamid antereoseptal,apical anterior & apical regions    Family History  Problem Relation Age of Onset  . Anesthesia problems Daughter   . Stroke Father   . Hyperlipidemia Brother     History   Social History  . Marital Status: Widowed    Spouse Name: N/A    Number of Children: N/A  . Years of Education: N/A   Occupational History  . Not on file.   Social History Main Topics  . Smoking status: Never Smoker   . Smokeless tobacco: Never Used  . Alcohol Use: No  . Drug Use: No  . Sexual Activity: No   Other Topics Concern  . Not on file   Social History Narrative  . No narrative on file    Review of systems: The patient specifically denies any chest pain at rest or with exertion, dyspnea at rest or with exertion, orthopnea, paroxysmal nocturnal dyspnea, syncope, palpitations, focal neurological deficits, intermittent claudication, lower extremity edema, unexplained weight gain, cough, hemoptysis or wheezing.  The patient also denies abdominal pain, nausea, vomiting, dysphagia, diarrhea, constipation, polyuria, polydipsia, dysuria, hematuria, frequency, urgency, abnormal bleeding or bruising, fever, chills, unexpected weight changes, mood swings, change in skin or hair texture, change in voice quality, auditory or visual problems, allergic reactions or rashes, new  musculoskeletal complaints other than usual "aches and pains".   PHYSICAL EXAM BP 150/68  Pulse 74  Ht 5\' 8"  (1.727 m)  Wt 212 lb 3.2 oz (96.253 kg)  BMI 32.27 kg/m2  General: Alert, oriented x3, no distress Head: no evidence of trauma, PERRL, EOMI, no exophtalmos or lid lag, no myxedema, no xanthelasma; normal ears, nose and oropharynx Neck: normal jugular venous pulsations and no hepatojugular reflux; brisk carotid pulses without delay and no carotid bruits Chest: clear to auscultation, no signs of consolidation by percussion or palpation, normal fremitus, symmetrical and full respiratory  excursions Cardiovascular: normal position and quality of the apical impulse, regular rhythm, normal first and second heart sounds, no  rubs or gallops; there is a grade 2/6 holosystolic murmur at the left lower sternal border, most consistent with tricuspid regurgitation. There is a faint apical holosystolic murmur. Abdomen: no tenderness or distention, no masses by palpation, no abnormal pulsatility or arterial bruits, normal bowel sounds, no hepatosplenomegaly Extremities: no clubbing, cyanosis or edema; 2+ radial, ulnar and brachial pulses bilaterally; 2+ right femoral, posterior tibial and dorsalis pedis pulses; 2+ left femoral, posterior tibial and dorsalis pedis pulses; no subclavian or femoral bruits Neurological: grossly nonfocal   EKG: Sinus rhythm, delayed hours progression, unchanged from previous tracings  Lipid Panel  No results found for this basename: chol, trig, hdl, cholhdl, vldl, ldlcalc    BMET    Component Value Date/Time   NA 143 04/18/2012 1032   K 2.8* 04/18/2012 1032   CL 108 04/18/2012 1032   CO2 27 04/18/2012 1032   GLUCOSE 102* 04/18/2012 1032   BUN 10 04/18/2012 1032   CREATININE 0.67 04/18/2012 1032   CALCIUM 8.9 04/18/2012 1032   GFRNONAA >90 04/18/2012 1032   GFRAA >90 04/18/2012 1032     ASSESSMENT AND PLAN Cirrhosis, non-alcoholic Some of her sleep difficulty may  be related to subtle hepatic encephalopathy  MVP (mitral valve prolapse) Not confirmed on most recent echo study.  OSA (obstructive sleep apnea) Compliance with CPAP is reinforced.. obviously poor compliance with CPAP may also explain her daytime somnolence  PSVT (paroxysmal supraventricular tachycardia) No recent complaint   Orders Placed This Encounter  Procedures  . EKG 12-Lead   Meds ordered this encounter  Medications  . potassium chloride SA (K-DUR,KLOR-CON) 20 MEQ tablet    Sig: Take 20 mEq by mouth as needed. Take 20 mEq by mouth Two (2) times a day.    Junious Silk, MD, [facility] [phone] office [phone] pager

## 2012-11-01 NOTE — Assessment & Plan Note (Signed)
Compliance with CPAP is reinforced.. obviously poor compliance with CPAP may also explain her daytime somnolence

## 2012-11-27 ENCOUNTER — Telehealth: Payer: Self-pay | Admitting: Cardiovascular Disease

## 2012-11-27 NOTE — Telephone Encounter (Signed)
Returned call.  Pt stated she was previously seen by Dr. Clarene Duke and referred to Dr. Salena Saner.  Stated when she saw Dr. Salena Saner he mentioned maybe putting a loop in her chest to monitor her heart continuously.  Pt c/o low BP at 122/53.  Also c/o waking up in the middle of the night in the bathroom on the toilet.  Stated she thinks she used the bathroom and went back to bed and wakes up there.  Pt stated that is how she broke her arm, from falling off the toilet.  Pt informed chart reviewed and Dr. Salena Saner mentioned she was not compliant w/ her CPAP, which may be causing her symptoms.  Pt informed it sounds like her symptoms are mostly r/t her not getting enough sleep at night and using her CPAP will likely help w/ her symptoms.  Pt stated she doesn't use it b/c she feels like she's choking when she wears it.  Pt informed Dr. Salena Saner will be notified for further instructions and will likely advise she wear CPAP and see Dr. Tresa Endo r/t sleep apnea.  Pt verbalized understanding and agreed w/ plan.  Dr. Salena Saner notified and advised he can set pt up for the loop recorder, but would like to address her sleep apnea first.  Advised appt w/ Dr. Landry [facility] sleep clinic.  Burna Mortimer, CMA w/ Dr. Tresa Endo notified and advised pt come in for appt w/ Dr. Tresa Endo to discuss apnea.  Stated if pt is not wearing CPAP, bringing in her machine will not help the tech make adjustments.  Scheduling to schedule appt and RN will call pt back w/ appt.  Appt scheduled for 10.9.14 at 2pm w/ Dr. Tresa Endo.  Call to pt and informed.  Pt verbalized understanding and agreed w/ plan.  Pt wants to make sure Dr. Salena Saner understands she has critical low platelets if she is to have the loop recorder procedure.  Pt nervous about procedure and informed that will be discussed at a later date as Dr. Salena Saner wants to focus on sleep apnea first.  Pt verbalized understanding and agreed w/ plan.

## 2012-11-27 NOTE — Telephone Encounter (Signed)
Patient states that at one time it was mentioned that she have a holter monitor.  She is still having some issues can we schedule this?

## 2012-11-27 NOTE — Telephone Encounter (Signed)
Returned call.  Left message to call back before 4pm.  

## 2012-11-27 NOTE — Telephone Encounter (Signed)
Return your call.. Please call back .[name]

## 2012-12-05 ENCOUNTER — Telehealth: Payer: Self-pay | Admitting: Cardiovascular Disease

## 2012-12-05 NOTE — Telephone Encounter (Addendum)
She called back wanted inform Dr C  That she would like to wear monitor instead  Have a loop recorder implanted.She is aware of her appointment with Dr Tresa Endo concerning sleep apnea . She thinks she had an episode recently hitting the dresser in her room. She was concerned that she does not have an appointment with Dr C until feb 2015.  [name]

## 2012-12-05 NOTE — Telephone Encounter (Signed)
Needs to talk to someone about monitor that Dr C suggested  Is passing out and falling {patient told me Dr Tresa Endo patient)

## 2012-12-05 NOTE — Telephone Encounter (Signed)
30 day event monitor is appropriate, but may not answer the question unless she has an event during those 30 days (then we will be back to the loop recorder again)

## 2012-12-10 ENCOUNTER — Telehealth: Payer: Self-pay | Admitting: *Deleted

## 2012-12-10 DIAGNOSIS — R55 Syncope and collapse: Secondary | ICD-10-CM

## 2012-12-10 NOTE — Telephone Encounter (Signed)
Informed patient that Dr C ordered an event monitor for 30 days sx syncope. See previous telephone message  Pt will have monitor placed on thur 12/11/12  While she is here to see Dr Tresa Endo  Notified scheduler

## 2012-12-11 ENCOUNTER — Ambulatory Visit (INDEPENDENT_AMBULATORY_CARE_PROVIDER_SITE_OTHER): Payer: Medicare Other | Admitting: *Deleted

## 2012-12-11 ENCOUNTER — Ambulatory Visit (INDEPENDENT_AMBULATORY_CARE_PROVIDER_SITE_OTHER): Payer: Medicare Other | Admitting: Cardiovascular Disease

## 2012-12-11 ENCOUNTER — Encounter: Payer: Self-pay | Admitting: Cardiovascular Disease

## 2012-12-11 VITALS — BP 172/70 | HR 64 | Ht 68.0 in | Wt 206.6 lb

## 2012-12-11 DIAGNOSIS — K7460 Unspecified cirrhosis of liver: Secondary | ICD-10-CM

## 2012-12-11 DIAGNOSIS — I10 Essential (primary) hypertension: Secondary | ICD-10-CM

## 2012-12-11 DIAGNOSIS — R5381 Other malaise: Secondary | ICD-10-CM

## 2012-12-11 DIAGNOSIS — R55 Syncope and collapse: Secondary | ICD-10-CM

## 2012-12-11 DIAGNOSIS — I059 Rheumatic mitral valve disease, unspecified: Secondary | ICD-10-CM

## 2012-12-11 DIAGNOSIS — Z79899 Other long term (current) drug therapy: Secondary | ICD-10-CM

## 2012-12-11 DIAGNOSIS — I341 Nonrheumatic mitral (valve) prolapse: Secondary | ICD-10-CM

## 2012-12-11 DIAGNOSIS — I471 Supraventricular tachycardia, unspecified: Secondary | ICD-10-CM

## 2012-12-11 DIAGNOSIS — G4733 Obstructive sleep apnea (adult) (pediatric): Secondary | ICD-10-CM

## 2012-12-11 MED ORDER — ROPINIROLE HCL 0.25 MG PO TABS: ORAL_TABLET | ORAL | Status: DC

## 2012-12-11 MED ORDER — ROPINIROLE HCL 1 MG PO TABS: ORAL_TABLET | ORAL | Status: DC

## 2012-12-11 NOTE — Progress Notes (Signed)
Patient presents to the office for an event monitor placement, and an appointment with TK. Event monitor placed, and verbal instructions given. Patient voiced understanding. Will follow up as scheduled.

## 2012-12-11 NOTE — Patient Instructions (Addendum)
Your physician has recommended you make the following change in your medication: stop the mirapex. Start new prescription given for generic requip.  Your physician recommends that you schedule a follow-up appointment in: in our next [facility]. (November)

## 2012-12-12 LAB — TSH: TSH: 2.124 u[IU]/mL (ref 0.350–4.500)

## 2012-12-12 LAB — COMPREHENSIVE METABOLIC PANEL
ALT: 13 U/L (ref 0–35)
BUN: 9 mg/dL (ref 6–23)
CO2: 29 mEq/L (ref 19–32)
Creat: 0.73 mg/dL (ref 0.50–1.10)
Glucose, Bld: 98 mg/dL (ref 70–99)
Total Bilirubin: 1.8 mg/dL — ABNORMAL HIGH (ref 0.3–1.2)

## 2012-12-12 LAB — CBC
Hemoglobin: 10.7 g/dL — ABNORMAL LOW (ref 12.0–15.0)
MCH: 27.9 pg (ref 26.0–34.0)
MCHC: 33.8 g/dL (ref 30.0–36.0)
MCV: 82.6 fL (ref 78.0–100.0)

## 2012-12-12 LAB — FERRITIN: Ferritin: 20 ng/mL (ref 10–291)

## 2012-12-13 ENCOUNTER — Encounter: Payer: Self-pay | Admitting: Cardiovascular Disease

## 2012-12-13 NOTE — Progress Notes (Signed)
Patient ID: CATHRINE KRIZAN, female   DOB: Jan 07, 1946, 67 y.o.   MRN: [id number]       HPI: NAYDELINE MORACE, is a 67 y.o. female who presents for evaluation of obstructive sleep apnea. She is a former patient of Dr. Caprice Kluver and recently had seen Dr. Royann Shivers. She is referred for further evaluation of her sleep apnea.  Ms. Holts is a 67 year old female who has a history of breast cancer, non-alcoholic cirrhosis, diabetes, restless leg syndrome, migraine headaches, nephrolithiasis, hypertension, history of herniated discs, tonsillectomy, and depression. She apparently underwent a diagnostic sleep study at the [facility] sleep lab in December 2012 which showed an HIV 16.8 and oxygen dropped to 81%. She subsequently was referred for a CPAP titration which was done in January 2013. Of note, during that study she did have a 12 beat run of superventricular tachycardia tachycardia. She was titrated only up to 7 cm water pressure. She was given a petite take Calc zest nasal mask.  The patient states that she never did followup  After CPAP initiate it is not after CPAP recommendation per she saw Dr. Clarene Duke in July 2013 and was not using CPAP at that time. She recently established care with Dr. Leandra Kern for a peer she also has a history of mitral valve prolapse but apparently this was not definitively seen on the last echo she did have mild mitral regurgitation. Because he has not been using therapy she now presents for evaluation. She does have difficulty with restless legs. She does not believe the MiraLax that she has been prescribed in the past has been helping. She wakes up several times per night. She does snore.   Epworth Sleepiness Scale: Situation   Chance of Dozing/Sleeping (0 = never , 1 = slight chance , 2 = moderate chance , 3 = high chance )   sitting and reading 2   watching TV 1   sitting inactive in a public place 0   being a passenger in a motor vehicle for an hour or more 2   lying  down in the afternoon 1   sitting and talking to someone 1   sitting quietly after lunch (no alcohol) 1   while stopped for a few minutes in traffic as the driver 0   Total Score  8    Past Medical History  Diagnosis Date  . Hypertension   . Restless leg   . Heart murmur   . Shortness of breath     with exertion   . Sleep apnea 2013    sleep study at [facility]    uses cpap  . Diabetes mellitus     no meds  . 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  platelets  low... wbc also low per pt ..   . Blood dyscrasia     low platelets ... low wbc.  ? liver  disease.   . H/O hiatal hernia   . Headache(784.0)   . Anxiety   . Depression   . Chronic kidney disease 50's    nephritis--none at present *12/03/2011)  . Cancer 2005    left breast  . Platelets decreased     ??   D/T CIRRHOISIS  . Family history of anesthesia complication     Daughter has complications with Versed  . PSVT (paroxysmal supraventricular tachycardia)     Past Surgical History  Procedure Laterality Date  . Cholecystectomy    . Appendectomy    .  Tonsillectomy    . Abdominal hysterectomy    . Cardiac catheterization  1975    normal cath  -low potassium  . Orif humerus fracture  06/19/2011    Procedure: OPEN REDUCTION INTERNAL FIXATION (ORIF) DISTAL HUMERUS FRACTURE;  Surgeon: Mable Paris, MD;  Location: [facility] OR;  Service: Orthopedics;  Laterality: Left;  . Breast surgery      left lumpectomy--few nodes removed  . Mastectomy    . Orif humerus fracture  12/11/2011    Procedure: OPEN REDUCTION INTERNAL FIXATION (ORIF) HUMERAL SHAFT FRACTURE;  Surgeon: Mable Paris, MD;  Location: [facility] OR;  Service: Orthopedics;  Laterality: Left;   Removal of Hardware left humerus, Open Reduction Internal Fixation Left Humeral Shaft Fracture Non-Union  . US echocardiography  03/16/2011    mild concentric LVH,mod LAE,stage I diastolic dysfunction,MAC,mild MR,aortic sclerosis,mild TR  . Nm myocar perf wall motion   08/17/2008    mild to mod. superimposed ischemiamid antereoseptal,apical anterior & apical regions    Allergies  Allergen Reactions  . Penicillins Anaphylaxis, Itching and Swelling  . Aspirin     Has liver disease  . Midazolam Hcl Other (See Comments)    Daughter has allergy, doctors advised to warn against using.Marland KitchenMarland KitchenPropofol is okay to use.  . Niacin And Related Itching    Feels like skin is on fire    Current Outpatient Prescriptions  Medication Sig Dispense Refill  . BuPROPion HCl (WELLBUTRIN XL PO) Take 225 mg by mouth at bedtime.      . diazepam (VALIUM) 5 MG tablet Take 5 mg by mouth every 6 (six) hours as needed. Anxiety      . fentaNYL (DURAGESIC - DOSED MCG/HR) 75 MCG/HR Place 1 patch onto the skin every 3 (three) days.      [name] gabapentin (NEURONTIN) 100 MG capsule Take 200 mg by mouth at bedtime.       [name] HYDROcodone-acetaminophen (NORCO/VICODIN) 5-325 MG per tablet Take 2 tablets by mouth 2 (two) times daily.      [name] lactulose (CHRONULAC) 10 GM/15ML solution Take 45 g by mouth daily as needed. For constipation      . potassium chloride SA (K-DUR,KLOR-CON) 20 MEQ tablet Take 20 mEq by mouth as needed. Take 20 mEq by mouth Two (2) times a day.      . promethazine (PHENERGAN) 12.5 MG tablet Take 12.5 mg by mouth every 6 (six) hours as needed. For nausea      . rOPINIRole (REQUIP) 0.25 MG tablet Take 1 tablet for 2 days, then 2 tablets for 5 days  12 tablet  0  . rOPINIRole (REQUIP) 1 MG tablet Take 1 tablet 1-3 hours prior to bedtime  30 tablet  6  . traZODone (DESYREL) 50 MG tablet Take 50 mg by mouth at bedtime.      [name] zolpidem (AMBIEN) 10 MG tablet Take 10 mg by mouth at bedtime as needed. For sleep       No current facility-administered medications for this visit.    Socially she is widowed and has 2 children one grandchild. There is no tobacco or alcohol use.  ROS negative for fever, chills or night sweats. She denies skin rash. Does have a history of occasional palpitations  but recently these have been controlled. She denies presyncope or syncope. She denies wheezing. She denies chest pressure. She does have a history of migraine headaches. There also has been a history of depression. She denies abdominal pain. She denies nausea vomiting or diarrhea. She  denies bleeding. She denies hematuria or hematocrit and hematochezia. She denies claudication. She denies paresthesias. She does snore. She denies significant residual daytime sleepiness. Her sleep is nonrestorative. She denies hypnagogic hallucinations. She denies any episodes of cataplexy. Other comprehensive 12 point system review is negative.  PE BP 172/70  Pulse 64  Ht 5\' 8"  (1.727 m)  Wt 206 lb 9.6 oz (93.713 kg)  BMI 31.42 kg/m2  Repeat blood pressure by me was 137/74. General: Alert, oriented, no distress.  Skin: normal turgor, no rashes HEENT: Normocephalic, atraumatic. Pupils round and reactive; sclera anicteric; Fundi mild vessel tortuosity. No hemorrhages or exudates Nose without nasal septal hypertrophy Mouth/Parynx benign; Mallinpatti scale 3 Neck: No JVD, no carotid briuts Lungs: clear to ausculatation and percussion; no wheezing or rales Heart: RRR, s1 s2 normal; 2/6 systolic murmur Abdomen: soft, nontender; no hepatosplenomehaly, BS+; abdominal aorta nontender and not dilated by palpation. Pulses 2+ Extremities: no clubbinbg cyanosis or edema, Homan's sign negative  Neurologic: grossly nonfocal  psychological he normal affect and mood    LABS:  BMET    Component Value Date/Time   NA 140 12/11/2012 1039   K 3.4* 12/11/2012 1039   CL 104 12/11/2012 1039   CO2 29 12/11/2012 1039   GLUCOSE 98 12/11/2012 1039   BUN 9 12/11/2012 1039   CREATININE 0.73 12/11/2012 1039   CREATININE 0.67 04/18/2012 1032   CALCIUM 8.7 12/11/2012 1039   GFRNONAA >90 04/18/2012 1032   GFRAA >90 04/18/2012 1032     Hepatic Function Panel     Component Value Date/Time   PROT 6.1 12/11/2012 1039   ALBUMIN 3.3*  12/11/2012 1039   AST 27 12/11/2012 1039   ALT 13 12/11/2012 1039   ALKPHOS 168* 12/11/2012 1039   BILITOT 1.8* 12/11/2012 1039   BILIDIR 0.2 01/09/2009 0442   IBILI 0.3 01/09/2009 0442     CBC    Component Value Date/Time   WBC 2.6* 12/11/2012 1039   WBC 2.9* 02/17/2010 1128   RBC 3.84* 12/11/2012 1039   RBC 4.24 02/17/2010 1128   HGB 10.7* 12/11/2012 1039   HGB 13.1 02/17/2010 1128   HCT 31.7* 12/11/2012 1039   HCT 38.3 02/17/2010 1128   PLT 41* 12/11/2012 1039   PLT 74* 02/17/2010 1128   MCV 82.6 12/11/2012 1039   MCV 90.1 02/17/2010 1128   MCH 27.9 12/11/2012 1039   MCH 30.9 02/17/2010 1128   MCHC 33.8 12/11/2012 1039   MCHC 34.3 02/17/2010 1128   RDW 19.5* 12/11/2012 1039   RDW 14.9* 02/17/2010 1128   LYMPHSABS 1.1 04/18/2012 1032   LYMPHSABS 0.9 02/17/2010 1128   MONOABS 0.3 04/18/2012 1032   MONOABS 0.2 02/17/2010 1128   EOSABS 0.0 04/18/2012 1032   EOSABS 0.0 02/17/2010 1128   BASOSABS 0.0 04/18/2012 1032   BASOSABS 0.0 02/17/2010 1128     BNP No results found for this basename: probnp    Lipid Panel  No results found for this basename: chol, trig, hdl, cholhdl, vldl, ldlcalc     RADIOLOGY: No results found.    ASSESSMENT AND PLAN: Ms. Scalia is a 67 year old female who has a history of PSVT, hypertension, nonalcoholic cirrhosis, diabetes mellitus, breast CA, who was found to have moderate sleep apnea on her diagnostic polysomnogram which was done at [facility]. On her initial CPAP titration trial on 04/03/2011 she was titrated up to only 7 cm water pressure. She has not utilized this. Typically when she had she could not sleep greater than 1 hour  she still has difficulty with the urge to move her legs in the early portion of sleep compatible with restless leg syndrome. She does not seem to have benefited from Mirapex. I am recommending we checked chemistry as well as a ferritin level since it patient's have restless leg syndrome it is recommended that for 2 levels greater than  50. I did give her a trial of Requip to start at 0.25 mg for 2 days, 0.5 mg for 5 days, and then titrate this to 1 mg to take one to 3 hours prior to going to sleep. I am recommending that she contacted [facility] her unit to a CPAP Auto unit such that we can obtain a download in approximately one month with an auto system to determine her optimal pressure. I did discuss the negative effects of untreated sleep apnea overall and specifically with reference to her cardiovascular comorbidities. I will see her back in a [facility] sleep clinic in several months and further recommendations will be made at that time.      Lennette Bihari, MD, [facility]  12/13/2012 12:25 PM

## 2012-12-18 ENCOUNTER — Telehealth: Payer: Self-pay | Admitting: Cardiovascular Disease

## 2012-12-18 ENCOUNTER — Other Ambulatory Visit: Payer: Self-pay | Admitting: *Deleted

## 2012-12-18 DIAGNOSIS — Z79899 Other long term (current) drug therapy: Secondary | ICD-10-CM

## 2012-12-18 DIAGNOSIS — R899 Unspecified abnormal finding in specimens from other organs, systems and tissues: Secondary | ICD-10-CM

## 2012-12-18 NOTE — Telephone Encounter (Signed)
Already spoken with patient. °

## 2012-12-18 NOTE — Progress Notes (Signed)
Quick Note:  Spoke with patient informing her of lab results. Also informed her per Vernona Rieger In gold to take of k today. Results also released into my chart. ______

## 2012-12-18 NOTE — Telephone Encounter (Signed)
Had labs done last week . No one has called with results. Please call

## 2012-12-18 NOTE — Progress Notes (Signed)
Quick Note:  I had [name] to review patient's K. Instructions were given. ______

## 2012-12-18 NOTE — Telephone Encounter (Signed)
Message forwarded to W. Waddell, CMA.  

## 2013-01-07 ENCOUNTER — Telehealth: Payer: Self-pay | Admitting: Cardiovascular Disease

## 2013-01-07 DIAGNOSIS — Z79899 Other long term (current) drug therapy: Secondary | ICD-10-CM

## 2013-01-07 DIAGNOSIS — R899 Unspecified abnormal finding in specimens from other organs, systems and tissues: Secondary | ICD-10-CM

## 2013-01-07 NOTE — Telephone Encounter (Signed)
Order released.  Called and informed.

## 2013-01-07 NOTE — Telephone Encounter (Signed)
Mrs.Dohse is at [facility] and they do not have an order for her labs

## 2013-01-08 ENCOUNTER — Other Ambulatory Visit: Payer: Self-pay

## 2013-01-08 LAB — BASIC METABOLIC PANEL
BUN: 10 mg/dL (ref 6–23)
CO2: 25 mEq/L (ref 19–32)
Calcium: 8.5 mg/dL (ref 8.4–10.5)
Chloride: 107 mEq/L (ref 96–112)
Creat: 0.66 mg/dL (ref 0.50–1.10)
Glucose, Bld: 101 mg/dL — ABNORMAL HIGH (ref 70–99)
Potassium: 3.3 mEq/L — ABNORMAL LOW (ref 3.5–5.3)
Sodium: 142 mEq/L (ref 135–145)

## 2013-01-12 ENCOUNTER — Telehealth: Payer: Self-pay | Admitting: Cardiovascular Disease

## 2013-01-12 NOTE — Telephone Encounter (Signed)
Wants to get lab results from last Wednesday.  Please call.

## 2013-01-12 NOTE — Telephone Encounter (Signed)
Message forwarded to Dr. Kelly.

## 2013-01-14 ENCOUNTER — Telehealth: Payer: Self-pay | Admitting: *Deleted

## 2013-01-14 ENCOUNTER — Other Ambulatory Visit: Payer: Self-pay | Admitting: *Deleted

## 2013-01-14 DIAGNOSIS — Z79899 Other long term (current) drug therapy: Secondary | ICD-10-CM

## 2013-01-14 MED ORDER — POTASSIUM CHLORIDE CRYS ER 20 MEQ PO TBCR: EXTENDED_RELEASE_TABLET | ORAL | Status: DC

## 2013-01-14 NOTE — Telephone Encounter (Signed)
Refilled potassium to [facility]. Ordered B-met for patient to have drawn on Monday.

## 2013-01-14 NOTE — Telephone Encounter (Signed)
Informed patient per Dr. Tresa Endo her K is still low. She is to take 84 Meq today then take 20 mg as directed.

## 2013-01-19 ENCOUNTER — Telehealth: Payer: Self-pay | Admitting: Cardiovascular Disease

## 2013-01-19 NOTE — Telephone Encounter (Signed)
Lab phone number not taken.  Lab order for BMP is in Epic and should be visible for tech to see.  Will await call back.

## 2013-01-19 NOTE — Telephone Encounter (Signed)
[name] is in lab now.  Needs lab order.  Please send as soon as possible

## 2013-01-20 ENCOUNTER — Encounter: Payer: Self-pay | Admitting: Cardiovascular Disease

## 2013-01-20 ENCOUNTER — Ambulatory Visit (INDEPENDENT_AMBULATORY_CARE_PROVIDER_SITE_OTHER): Payer: Medicare Other | Admitting: Cardiovascular Disease

## 2013-01-20 VITALS — BP 141/72 | HR 86 | Ht 68.0 in | Wt 204.4 lb

## 2013-01-20 DIAGNOSIS — I059 Rheumatic mitral valve disease, unspecified: Secondary | ICD-10-CM

## 2013-01-20 DIAGNOSIS — I471 Supraventricular tachycardia, unspecified: Secondary | ICD-10-CM

## 2013-01-20 DIAGNOSIS — R002 Palpitations: Secondary | ICD-10-CM

## 2013-01-20 DIAGNOSIS — I10 Essential (primary) hypertension: Secondary | ICD-10-CM

## 2013-01-20 DIAGNOSIS — I341 Nonrheumatic mitral (valve) prolapse: Secondary | ICD-10-CM

## 2013-01-20 DIAGNOSIS — G4733 Obstructive sleep apnea (adult) (pediatric): Secondary | ICD-10-CM

## 2013-01-20 NOTE — Progress Notes (Addendum)
Patient ID: [name], female   DOB: Feb 26, 1946, 67 y.o.   MRN: [id number]        HPI: [name], is a 67 y.o. female who presents for followup evaluation of obstructive sleep apnea. She is a former patient of Dr. Caprice Kluver and also sees Dr. Royann Shivers.   Ms. Hunton is a 67 year old female who has a history of breast cancer, non-alcoholic cirrhosis, diabetes, restless leg syndrome, migraine headaches, nephrolithiasis, hypertension, history of herniated discs, tonsillectomy, and depression. She apparently underwent a diagnostic sleep study at the [facility] sleep lab in December 2012 which showed an AHI 16.8 and oxygen dropped to 81%. She subsequently was referred for a CPAP titration which was done in January 2013. Of note, during that study she did have a 12 beat run of superventricular tachycardia tachycardia. She was titrated only up to 7 cm water pressure. She was given a petite nasal mask.  I had seen her for initial evaluation approximately 5 weeks ago. At that time, since she was not utilizing her CPAP therapy and had only been titrated to 7 cm water pressure I recommended that she be given a longer unit with a CPAP auto device set at a minimum pressure of 5 up to a maximum of 20. She also is not having any benefit from your PACs with reference to restless leg syndrome. Continently, I recommended a trial of record up to start at 0.25 mg for 2 days, 0.5 mg for 5 days, and then titrated this to 1 mg to take 1-3 hours prior to going to sleep. She has noticed some improvement in her restless leg since initiating Requip. She did have a followup download done from 12/14/2012 through 01/12/2013. Unfortunately, she has not still been utilizing CPAP for long enough duration, averaging only one hour and 50 minutes. Her AHI was increased at 15.9 11.3 events per hour were unknown and may be due to 2 significant leak from her mask.  An Epworth sleepiness scale score was recalculated today this  was slightly improved at 7.  Epworth Sleepiness Scale: Situation   Chance of Dozing/Sleeping (0 = never , 1 = slight chance , 2 = moderate chance , 3 = high chance )   sitting and reading 2   watching TV 2   sitting inactive in a public place 0   being a passenger in a motor vehicle for an hour or more 2   lying down in the afternoon 0   sitting and talking to someone 0   sitting quietly after lunch (no alcohol) 1   while stopped for a few minutes in traffic as the driver 0   Total Score  7    Past Medical History  Diagnosis Date  . Hypertension   . Restless leg   . Heart murmur   . Shortness of breath     with exertion   . Sleep apnea 2013    sleep study at [facility]    uses cpap  . Diabetes mellitus     no meds  . 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  platelets  low... wbc also low per pt ..   . Blood dyscrasia     low platelets ... low wbc.  ? liver  disease.   . H/O hiatal hernia   . Headache(784.0)   . Anxiety   . Depression   . Chronic kidney disease 50's    nephritis--none at present *12/03/2011)  . Cancer  2005    left breast  . Platelets decreased     ??   D/T CIRRHOISIS  . Family history of anesthesia complication     Daughter has complications with Versed  . PSVT (paroxysmal supraventricular tachycardia)     Past Surgical History  Procedure Laterality Date  . Cholecystectomy    . Appendectomy    . Tonsillectomy    . Abdominal hysterectomy    . Cardiac catheterization  1975    normal cath  -low potassium  . Orif humerus fracture  06/19/2011    Procedure: OPEN REDUCTION INTERNAL FIXATION (ORIF) DISTAL HUMERUS FRACTURE;  Surgeon: Mable Paris, MD;  Location: [facility];  Service: Orthopedics;  Laterality: Left;  . Breast surgery      left lumpectomy--few nodes removed  . Mastectomy    . Orif humerus fracture  12/11/2011    Procedure: OPEN REDUCTION INTERNAL FIXATION (ORIF) HUMERAL SHAFT FRACTURE;  Surgeon: Mable Paris, MD;  [facility];   Service: Orthopedics;  Laterality: Left;   Removal of Hardware left humerus, Open Reduction Internal Fixation Left Humeral Shaft Fracture Non-Union  . US echocardiography  03/16/2011    mild concentric LVH,mod LAE,stage I diastolic dysfunction,MAC,mild MR,aortic sclerosis,mild TR  . Nm myocar perf wall motion  08/17/2008    mild to mod. superimposed ischemiamid antereoseptal,apical anterior & apical regions    Allergies  Allergen Reactions  . Penicillins Anaphylaxis, Itching and Swelling  . Aspirin     Has liver disease  . Midazolam Hcl Other (See Comments)    Daughter has allergy, doctors advised to warn against using.Marland KitchenMarland KitchenPropofol is okay to use.  . Niacin And Related Itching    Feels like skin is on fire    Current Outpatient Prescriptions  Medication Sig Dispense Refill  . BuPROPion HCl (WELLBUTRIN XL PO) Take 225 mg by mouth at bedtime.      . diazepam (VALIUM) 5 MG tablet Take 5 mg by mouth every 6 (six) hours as needed. Anxiety      . fentaNYL (DURAGESIC - DOSED MCG/HR) 75 MCG/HR Place 1 patch onto the skin every 3 (three) days.      [name] gabapentin (NEURONTIN) 100 MG capsule Take 200 mg by mouth at bedtime.       [name] HYDROcodone-acetaminophen (NORCO/VICODIN) 5-325 MG per tablet Take 2 tablets by mouth as needed.       . lactulose (CHRONULAC) 10 GM/15ML solution Take 45 g by mouth daily as needed. For constipation      . potassium chloride SA (K-DUR,KLOR-CON) 20 MEQ tablet Take 2 tablets today, then 1 tablet daily.  31 tablet  3  . pramipexole (MIRAPEX) 1 MG tablet Take 1 tablet by mouth at bedtime.      . promethazine (PHENERGAN) 12.5 MG tablet Take 12.5 mg by mouth every 6 (six) hours as needed. For nausea      . rOPINIRole (REQUIP) 1 MG tablet Take 1 tablet 1-3 hours prior to bedtime  30 tablet  6  . traZODone (DESYREL) 50 MG tablet Take 50 mg by mouth at bedtime.       No current facility-administered medications for this visit.    Socially she is widowed and has 2 children one  grandchild. There is no tobacco or alcohol use.  ROS negative for fever, chills or night sweats. She denies skin rash. She does admit to occasional palpitations but recently these have been controlled. She denies presyncope or syncope. She denies wheezing. She denies chest pressure. She  does have a history of migraine headaches. There also has been a history of depression. She denies abdominal pain. She denies nausea vomiting or diarrhea. She denies bleeding. She denies hematuria or hematocrit and hematochezia. She denies claudication. She denies paresthesias. She does snore. She denies significant residual daytime sleepiness. Her sleep is nonrestorative. She denies hypnagogic hallucinations. She denies any episodes of cataplexy. There is no history of parasomnias or vivid dreams. Other comprehensive 12 point system review is negative.  PE BP 141/72  Pulse 86  Ht 5\' 8"  (1.727 m)  Wt 204 lb 6.4 oz (92.715 kg)  BMI 31.09 kg/m2  Repeat blood pressure by me was 137/74. General: Alert, oriented, no distress.  Skin: normal turgor, no rashes HEENT: Normocephalic, atraumatic. Pupils round and reactive; sclera anicteric; Fundi mild vessel tortuosity. No hemorrhages or exudates Nose without nasal septal hypertrophy Mouth/Parynx benign; Mallinpatti scale 3 Neck: No JVD, no carotid briuts Lungs: clear to ausculatation and percussion; no wheezing or rales Heart: RRR, s1 s2 normal; 2/6 systolic murmur; rare to occasional ectopy on auscultation Abdomen: soft, nontender; no hepatosplenomehaly, BS+; abdominal aorta nontender and not dilated by palpation. Pulses 2+ Extremities: no clubbinbg cyanosis or edema, Homan's sign negative  Neurologic: grossly nonfocal  psychological he normal affect and mood  Because of ectopy on auscultation, and ECG was performed today. This revealed normal sinus rhythm and went EKG was recorded there was no ectopy. Ventricular rate of 74 beats per minute. PR interval 122 ms QTc  interval 492 ms.  LABS:  BMET    Component Value Date/Time   NA 142 01/07/2013 1427   K 3.3* 01/07/2013 1427   CL 107 01/07/2013 1427   CO2 25 01/07/2013 1427   GLUCOSE 101* 01/07/2013 1427   BUN 10 01/07/2013 1427   CREATININE 0.66 01/07/2013 1427   CREATININE 0.67 04/18/2012 1032   CALCIUM 8.5 01/07/2013 1427   GFRNONAA >90 04/18/2012 1032   GFRAA >90 04/18/2012 1032     Hepatic Function Panel     Component Value Date/Time   PROT 6.1 12/11/2012 1039   ALBUMIN 3.3* 12/11/2012 1039   AST 27 12/11/2012 1039   ALT 13 12/11/2012 1039   ALKPHOS 168* 12/11/2012 1039   BILITOT 1.8* 12/11/2012 1039   BILIDIR 0.2 01/09/2009 0442   IBILI 0.3 01/09/2009 0442     CBC    Component Value Date/Time   WBC 2.6* 12/11/2012 1039   WBC 2.9* 02/17/2010 1128   RBC 3.84* 12/11/2012 1039   RBC 4.24 02/17/2010 1128   HGB 10.7* 12/11/2012 1039   HGB 13.1 02/17/2010 1128   HCT 31.7* 12/11/2012 1039   HCT 38.3 02/17/2010 1128   PLT 41* 12/11/2012 1039   PLT 74* 02/17/2010 1128   MCV 82.6 12/11/2012 1039   MCV 90.1 02/17/2010 1128   MCH 27.9 12/11/2012 1039   MCH 30.9 02/17/2010 1128   MCHC 33.8 12/11/2012 1039   MCHC 34.3 02/17/2010 1128   RDW 19.5* 12/11/2012 1039   RDW 14.9* 02/17/2010 1128   LYMPHSABS 1.1 04/18/2012 1032   LYMPHSABS 0.9 02/17/2010 1128   MONOABS 0.3 04/18/2012 1032   MONOABS 0.2 02/17/2010 1128   EOSABS 0.0 04/18/2012 1032   EOSABS 0.0 02/17/2010 1128   BASOSABS 0.0 04/18/2012 1032   BASOSABS 0.0 02/17/2010 1128     BNP No results found for this basename: probnp    Lipid Panel  No results found for this basename: chol,  trig,  hdl,  cholhdl,  vldl,  ldlcalc  RADIOLOGY: No results found.    ASSESSMENT AND PLAN: Ms. Makris is a 67 year old female who has a history of PSVT, hypertension, nonalcoholic cirrhosis, diabetes mellitus, breast CA, who was found to have moderate sleep apnea on her diagnostic polysomnogram which was done at [facility]. On her initial CPAP titration trial on  04/03/2011 she was titrated up to only 7 cm water pressure. Past month, she has now been on a CPAP auto unit. Download of this therapy reveals 67% of days with device usage but still with markedly reduced duration of therapy. She does have considerable leak and it seems her AHI is significantly elevated on the day she does leak. On further questioning she states oftentimes she is awakened from sleep in the mask is on the floor and she does not recall taking it off and how it had gotten there. We did interrogator unit today as well as her mask. She is in clear need for new cushions and the cushions were replaced. In addition her filter was replaced. She was complaining of temperature issues and we adjusted the temperature from 83-84 and increased humidity slightly from 4.5-5.0. In addition, she has been having difficulty initially falling asleep. We initiated ramp time which had been off to 30 minutes. We also changed her maximum pressure from 20-13 cm water pressure. A new download will be obtained following these adjustments after one month of treatment and additional recommendations will be made at that time if her download data is not significantly improved.     Lennette Bihari, MD, [facility]  01/20/2013 2:27 PM

## 2013-01-20 NOTE — Patient Instructions (Signed)
Your physician recommends that you schedule a follow-up appointment with Dr. Tresa Endo as needed for sleep apnea.

## 2013-01-21 ENCOUNTER — Ambulatory Visit: Payer: Medicare Other | Admitting: Cardiovascular Disease

## 2013-02-24 ENCOUNTER — Other Ambulatory Visit: Payer: Self-pay | Admitting: *Deleted

## 2013-05-29 ENCOUNTER — Other Ambulatory Visit: Payer: Self-pay | Admitting: Gastroenterology

## 2013-05-29 DIAGNOSIS — R109 Unspecified abdominal pain: Secondary | ICD-10-CM

## 2013-06-01 ENCOUNTER — Ambulatory Visit (INDEPENDENT_AMBULATORY_CARE_PROVIDER_SITE_OTHER): Payer: Medicare Other | Admitting: Cardiovascular Disease

## 2013-06-01 ENCOUNTER — Encounter: Payer: Self-pay | Admitting: Cardiovascular Disease

## 2013-06-01 ENCOUNTER — Telehealth: Payer: Self-pay | Admitting: Cardiovascular Disease

## 2013-06-01 VITALS — BP 176/86 | HR 68 | Resp 16 | Ht 68.0 in | Wt 207.8 lb

## 2013-06-01 DIAGNOSIS — I10 Essential (primary) hypertension: Secondary | ICD-10-CM

## 2013-06-01 MED ORDER — NADOLOL 40 MG PO TABS: 40.0000 mg | ORAL_TABLET | Freq: Every day | ORAL | Status: DC

## 2013-06-01 MED ORDER — PROPRANOLOL HCL 10 MG PO TABS: 10.0000 mg | ORAL_TABLET | Freq: Two times a day (BID) | ORAL | Status: DC

## 2013-06-01 NOTE — Patient Instructions (Signed)
Your physician recommends that you schedule a follow-up appointment in: [place] with Dr. Sallyanne Kuster  Start new Medication: Nadolol 40mg  by mouth  ONCE DAILY

## 2013-06-01 NOTE — Telephone Encounter (Signed)
Patient reports her new prescription, nadolol, got over $80/month. Would like to know if there is another comparable alternative in effectives that may be less expensive.

## 2013-06-01 NOTE — Telephone Encounter (Signed)
Rx was sent to pharmacy electronically. Patient notified.  

## 2013-06-01 NOTE — Telephone Encounter (Signed)
That cost is indeed ridiculously high for generic medication. Try propranolol 10 mg twice a day

## 2013-06-01 NOTE — Telephone Encounter (Signed)
PT was here this morning and have a  question about her Nadolol.

## 2013-06-03 ENCOUNTER — Encounter: Payer: Self-pay | Admitting: Cardiovascular Disease

## 2013-06-03 ENCOUNTER — Ambulatory Visit
Admission: RE | Admit: 2013-06-03 | Discharge: 2013-06-03 | Disposition: A | Discharge status: Home / Self Care | Payer: Medicare Other | Source: Ambulatory Visit | Attending: Gastroenterology | Admitting: Gastroenterology

## 2013-06-03 ENCOUNTER — Telehealth: Payer: Self-pay | Admitting: *Deleted

## 2013-06-03 DIAGNOSIS — R109 Unspecified abdominal pain: Secondary | ICD-10-CM

## 2013-06-03 MED ORDER — IOHEXOL 300 MG/ML  SOLN
125.0000 mL | Freq: Once | INTRAMUSCULAR | Status: AC | PRN
  Administered 2013-06-03: 125 mL via INTRAVENOUS

## 2013-06-03 NOTE — Progress Notes (Signed)
Patient ID: Migdalia Dk. female   DOB: [birth year], 68 y.o.   MRN: [id number]      Reason for office visit Total tachycardia, mitral valve prolapse  Mrs. Nilsen has not had much in the way of cardiac complaints, other than rare palpitations which are substantially better than at her last appointment . She has had 2 serious falls and is not sure if she lost consciousness. On both occasions it sounds like she simply fell asleep. On one occasion she was sitting on the commode and once was actually standing. She continues to have daytime sleepiness and nocturnal inability to sleep. Palpitations were not reported around either one of these events. She did not have serious injury. Her blood pressure has not been low and typically at home her blood pressure is in the 150-160s/80s.  She has been taking diazepam and hydrocodone and sometimes uses a fentanyl patch.  She has occasional very mild and random episodes of shortness of breath that resolves spontaneously. She has swelling in her legs and feet. This is usually mild and resolves by the time she wakes up in the morning.  Mrs. Coughlin was previously cared for by Dr. Aldona Bar. 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 She also has non-alcoholic cirrhosis, of uncertain etiology. She has systemic hypertension and her blood pressure is often high in the office although it is normal at home. She was diagnosed as having mitral valve prolapse in the past but her most recent echocardiogram in January 2013 did not describe this abnormality although there was mild mitral insufficiency.   Allergies  Allergen Reactions  . Penicillins Anaphylaxis, Itching and Swelling  . Aspirin     Has liver disease  . Midazolam Hcl Other (See Comments)    Daughter has allergy, doctors advised to warn against using.Marland KitchenMarland KitchenPropofol is okay to use.  . Niacin And  Related Itching    Feels like skin is on fire    Current Outpatient Prescriptions  Medication Sig Dispense Refill  . BuPROPion HCl (WELLBUTRIN XL PO) Take 75 mg by mouth 3 (three) times daily as needed.       . diazepam (VALIUM) 5 MG tablet Take 5 mg by mouth every 6 (six) hours as needed. Anxiety      . fentaNYL (DURAGESIC - DOSED MCG/HR) 75 MCG/HR Place 1 patch onto the skin every 3 (three) days.      [name] gabapentin (NEURONTIN) 100 MG capsule Take 200 mg by mouth at bedtime.       [name] HYDROcodone-acetaminophen (NORCO/VICODIN) 5-325 MG per tablet Take 2 tablets by mouth as needed.       . lactulose (CHRONULAC) 10 GM/15ML solution Take 45 g by mouth daily as needed. For constipation      . potassium chloride SA (K-DUR,KLOR-CON) 20 MEQ tablet Take 20 mEq by mouth daily. Take 2 tablets today, then 1 tablet daily.      . promethazine (PHENERGAN) 12.5 MG tablet Take 12.5 mg by mouth every 6 (six) hours as needed. For nausea      . rOPINIRole (REQUIP) 1 MG tablet Take 1 tablet 1-3 hours prior to bedtime  30 tablet  6  . traZODone (DESYREL) 50 MG tablet Take 50 mg by mouth at bedtime.      . propranolol (INDERAL) 10 MG tablet Take 1 tablet (10 mg total) by mouth 2 (two) times daily.  60 tablet  6  No current facility-administered medications for this visit.    Past Medical History  Diagnosis Date  . Hypertension   . Restless leg   . Heart murmur   . Shortness of breath     with exertion   . Sleep apnea 2013    sleep study at [facility]    uses cpap  . Diabetes mellitus     no meds  . 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  platelets  low... wbc also low per pt ..   . Blood dyscrasia     low platelets ... low wbc.  ? liver  disease.   . H/O hiatal hernia   . Headache(784.0)   . Anxiety   . Depression   . Chronic kidney disease 50's    nephritis--none at present *12/03/2011)  . Cancer 2005    left breast  . Platelets decreased     ??   D/T CIRRHOISIS  . Family history of anesthesia  complication     Daughter has complications with Versed  . PSVT (paroxysmal supraventricular tachycardia)     Past Surgical History  Procedure Laterality Date  . Cholecystectomy    . Appendectomy    . Tonsillectomy    . Abdominal hysterectomy    . Cardiac catheterization  1975    normal cath  -low potassium  . Orif humerus fracture  06/19/2011    Procedure: OPEN REDUCTION INTERNAL FIXATION (ORIF) DISTAL HUMERUS FRACTURE;  Surgeon: [name], MD;  Location: [place];  Service: Orthopedics;  Laterality: Left;  . Breast surgery      left lumpectomy--few nodes removed  . Mastectomy    . Orif humerus fracture  12/11/2011    Procedure: OPEN REDUCTION INTERNAL FIXATION (ORIF) HUMERAL SHAFT FRACTURE;  Surgeon: [name], MD;  Location: [place];  Service: Orthopedics;  Laterality: Left;   Removal of Hardware left humerus, Open Reduction Internal Fixation Left Humeral Shaft Fracture Non-Union  . US echocardiography  03/16/2011    mild concentric LVH,mod LAE,stage I diastolic dysfunction,MAC,mild MR,aortic sclerosis,mild TR  . Nm myocar perf wall motion  08/17/2008    mild to mod. superimposed ischemiamid antereoseptal,apical anterior & apical regions    Family History  Problem Relation Age of Onset  . Anesthesia problems Daughter   . Stroke Father   . Hyperlipidemia Brother     History   Social History  . Marital Status: Widowed    Spouse Name: N/A    Number of Children: N/A  . Years of Education: N/A   Occupational History  . Not on file.   Social History Main Topics  . Smoking status: Never Smoker   . Smokeless tobacco: Never Used  . Alcohol Use: No  . Drug Use: No  . Sexual Activity: No   Other Topics Concern  . Not on file   Social History Narrative  . No narrative on file    Review of systems: She has difficulty resting at night but often falls asleep during the day. The patient specifically denies any chest pain at rest or with exertion,  dyspnea at rest or with exertion, orthopnea, paroxysmal nocturnal dyspnea, syncope, palpitations, focal neurological deficits, intermittent claudication, unexplained weight gain, cough, hemoptysis or wheezing.  The patient also denies abdominal pain, nausea, vomiting, dysphagia, diarrhea, constipation, polyuria, polydipsia, dysuria, hematuria, frequency, urgency, abnormal bleeding or bruising, fever, chills, unexpected weight changes, mood swings, change in skin or hair texture, change in voice quality, auditory or visual problems, allergic  reactions or rashes, new musculoskeletal complaints other than usual "aches and pains".   PHYSICAL EXAM BP 176/86  Pulse 68  Resp 16  Ht 5\' 8"  (1.727 m)  Wt 94.257 kg (207 lb 12.8 oz)  BMI 31.60 kg/m2 General: Alert, oriented x3, no distress  Head: no evidence of trauma, PERRL, EOMI, no exophtalmos or lid lag, no myxedema, no xanthelasma; normal ears, nose and oropharynx  Neck: normal jugular venous pulsations and no hepatojugular reflux; brisk carotid pulses without delay and no carotid bruits  Chest: clear to auscultation, no signs of consolidation by percussion or palpation, normal fremitus, symmetrical and full respiratory excursions  Cardiovascular: normal position and quality of the apical impulse, regular rhythm, normal first and second heart sounds, no rubs or gallops; there is a grade 2/6 holosystolic murmur at the left lower sternal border, most consistent with tricuspid regurgitation. There is a faint apical holosystolic murmur.  Abdomen: no tenderness or distention, no masses by palpation, no abnormal pulsatility or arterial bruits, normal bowel sounds, no hepatosplenomegaly  Extremities: no clubbing, cyanosis or edema; 2+ radial, ulnar and brachial pulses bilaterally; 2+ right femoral, posterior tibial and dorsalis pedis pulses; 2+ left femoral, posterior tibial and dorsalis pedis pulses; no subclavian or femoral bruits  Neurological: grossly  nonfocal   EKG: Poor R-wave progression otherwise normal ECG; normal sinus rhythm  Lipid Panel  No results found for this basename: chol, trig, hdl, cholhdl, vldl, ldlcalc    BMET    Component Value Date/Time   NA 142 01/07/2013 1427   K 3.3* 01/07/2013 1427   CL 107 01/07/2013 1427   CO2 25 01/07/2013 1427   GLUCOSE 101* 01/07/2013 1427   BUN 10 01/07/2013 1427   CREATININE 0.66 01/07/2013 1427   CREATININE 0.67 04/18/2012 1032   CALCIUM 8.5 01/07/2013 1427   GFRNONAA >90 04/18/2012 1032   GFRAA >90 04/18/2012 1032     ASSESSMENT AND PLAN  I am concerned that Mrs. Inscoe has findings of hepatic encephalopathy, worsened by use of benzodiazepines and opiates. I think she should discontinue these medications. She has a followup scheduled for her liver disease in the near future. She has known esophageal varices.  Although the symptoms of SVT have not recently been a problem her blood pressure is a little high. I think we may be able to assist with several problems by starting a nonspecific beta blocker. This may help reduce the severity of portal hypertension, prevent arrhythmia and to normalize her systemic blood pressure. I initially gave her prescription for generic nadolol, but she called back saying that the prescription was very expensive. We will try twice daily propranolol.  Reevaluate in one month.   Patient Instructions  Your physician recommends that you schedule a follow-up appointment in: [place] with Dr. Sallyanne Kuster  Start new Medication: Nadolol 40mg  by mouth  ONCE DAILY. (Nadolol too expensive. Take propranolol 10 mg twice a day)     Orders Placed This Encounter  Procedures  . EKG 12-Lead   Meds ordered this encounter  Medications  . potassium chloride SA (K-DUR,KLOR-CON) 20 MEQ tablet    Sig: Take 20 mEq by mouth daily. Take 2 tablets today, then 1 tablet daily.  [name] DISCONTD: nadolol (CORGARD) 40 MG tablet    Sig: Take 1 tablet (40 mg total) by mouth daily.     Dispense:  30 tablet    Refill:  2    Venancio Chenier  Sanda Klein, MD, [facility] [phone] office [phone] pager

## 2013-06-03 NOTE — Telephone Encounter (Signed)
Returned call and pt verified x 2.  Pt informed message received and script was sent to pharmacy for propranolol 10 mg twice daily on 3.30.15, same day of visit, at 5:19pm.  Informed confirmation received.  Pt verbalized understanding and stated she will call the pharmacy now.

## 2013-06-03 NOTE — Telephone Encounter (Signed)
Pt called stating that Dr.C was going to prescribe her something different. The medication that he gave her the other day when she went to the pharmacy to pick it up it was too expensive. She stated that she spoke to the nurse yesterday and she was supposed to take care of it. She did not know the other medication that he was supposed to prescribe/  [facility]

## 2013-06-14 ENCOUNTER — Emergency Department (HOSPITAL_COMMUNITY)
Admission: EM | Admit: 2013-06-14 | Discharge: 2013-06-14 | Disposition: A | Discharge status: Home / Self Care | Payer: Medicare Other | Attending: Emergency Medicine | Admitting: Emergency Medicine

## 2013-06-14 ENCOUNTER — Other Ambulatory Visit: Payer: Self-pay

## 2013-06-14 ENCOUNTER — Encounter (HOSPITAL_COMMUNITY): Payer: Self-pay | Admitting: Emergency Medicine

## 2013-06-14 ENCOUNTER — Emergency Department (HOSPITAL_COMMUNITY): Payer: Medicare Other

## 2013-06-14 DIAGNOSIS — Z79899 Other long term (current) drug therapy: Secondary | ICD-10-CM | POA: Insufficient documentation

## 2013-06-14 DIAGNOSIS — N189 Chronic kidney disease, unspecified: Secondary | ICD-10-CM | POA: Insufficient documentation

## 2013-06-14 DIAGNOSIS — R011 Cardiac murmur, unspecified: Secondary | ICD-10-CM | POA: Insufficient documentation

## 2013-06-14 DIAGNOSIS — Z862 Personal history of diseases of the blood and blood-forming organs and certain disorders involving the immune mechanism: Secondary | ICD-10-CM | POA: Insufficient documentation

## 2013-06-14 DIAGNOSIS — F411 Generalized anxiety disorder: Secondary | ICD-10-CM | POA: Insufficient documentation

## 2013-06-14 DIAGNOSIS — R600 Localized edema: Secondary | ICD-10-CM

## 2013-06-14 DIAGNOSIS — I129 Hypertensive chronic kidney disease with stage 1 through stage 4 chronic kidney disease, or unspecified chronic kidney disease: Secondary | ICD-10-CM | POA: Insufficient documentation

## 2013-06-14 DIAGNOSIS — Z8719 Personal history of other diseases of the digestive system: Secondary | ICD-10-CM | POA: Insufficient documentation

## 2013-06-14 DIAGNOSIS — K089 Disorder of teeth and supporting structures, unspecified: Secondary | ICD-10-CM | POA: Insufficient documentation

## 2013-06-14 DIAGNOSIS — R609 Edema, unspecified: Secondary | ICD-10-CM | POA: Insufficient documentation

## 2013-06-14 DIAGNOSIS — G4730 Sleep apnea, unspecified: Secondary | ICD-10-CM | POA: Insufficient documentation

## 2013-06-14 DIAGNOSIS — Z9981 Dependence on supplemental oxygen: Secondary | ICD-10-CM | POA: Insufficient documentation

## 2013-06-14 DIAGNOSIS — Z880 Allergy status to penicillin: Secondary | ICD-10-CM | POA: Insufficient documentation

## 2013-06-14 DIAGNOSIS — Z853 Personal history of malignant neoplasm of breast: Secondary | ICD-10-CM | POA: Insufficient documentation

## 2013-06-14 DIAGNOSIS — Z9889 Other specified postprocedural states: Secondary | ICD-10-CM | POA: Insufficient documentation

## 2013-06-14 DIAGNOSIS — E119 Type 2 diabetes mellitus without complications: Secondary | ICD-10-CM | POA: Insufficient documentation

## 2013-06-14 DIAGNOSIS — K0889 Other specified disorders of teeth and supporting structures: Secondary | ICD-10-CM

## 2013-06-14 DIAGNOSIS — F3289 Other specified depressive episodes: Secondary | ICD-10-CM | POA: Insufficient documentation

## 2013-06-14 DIAGNOSIS — F329 Major depressive disorder, single episode, unspecified: Secondary | ICD-10-CM | POA: Insufficient documentation

## 2013-06-14 HISTORY — DX: Portal hypertension: K76.6

## 2013-06-14 LAB — HEPATIC FUNCTION PANEL
ALBUMIN: 3.2 g/dL — AB (ref 3.5–5.2)
ALT: 17 U/L (ref 0–35)
AST: 40 U/L — ABNORMAL HIGH (ref 0–37)
Alkaline Phosphatase: 266 U/L — ABNORMAL HIGH (ref 39–117)
Bilirubin, Direct: 0.6 mg/dL — ABNORMAL HIGH (ref 0.0–0.3)
Indirect Bilirubin: 1.2 mg/dL — ABNORMAL HIGH (ref 0.3–0.9)
TOTAL PROTEIN: 6.5 g/dL (ref 6.0–8.3)
Total Bilirubin: 1.8 mg/dL — ABNORMAL HIGH (ref 0.3–1.2)

## 2013-06-14 LAB — CBC
HCT: 34 % — ABNORMAL LOW (ref 36.0–46.0)
Hemoglobin: 11.7 g/dL — ABNORMAL LOW (ref 12.0–15.0)
MCH: 31.3 pg (ref 26.0–34.0)
MCHC: 34.4 g/dL (ref 30.0–36.0)
MCV: 90.9 fL (ref 78.0–100.0)
PLATELETS: 43 10*3/uL — AB (ref 150–400)
RBC: 3.74 MIL/uL — ABNORMAL LOW (ref 3.87–5.11)
RDW: 17.5 % — AB (ref 11.5–15.5)
WBC: 2.9 10*3/uL — ABNORMAL LOW (ref 4.0–10.5)

## 2013-06-14 LAB — BASIC METABOLIC PANEL
BUN: 13 mg/dL (ref 6–23)
CO2: 27 mEq/L (ref 19–32)
Calcium: 8.8 mg/dL (ref 8.4–10.5)
Chloride: 103 mEq/L (ref 96–112)
Creatinine, Ser: 0.61 mg/dL (ref 0.50–1.10)
GLUCOSE: 79 mg/dL (ref 70–99)
POTASSIUM: 3.8 meq/L (ref 3.7–5.3)
SODIUM: 142 meq/L (ref 137–147)

## 2013-06-14 LAB — PRO B NATRIURETIC PEPTIDE: PRO B NATRI PEPTIDE: 62.4 pg/mL (ref 0–125)

## 2013-06-14 MED ORDER — FUROSEMIDE 10 MG/ML IJ SOLN: 40.0000 mg | Freq: Once | INTRAMUSCULAR | Status: DC

## 2013-06-14 MED ORDER — FUROSEMIDE 20 MG PO TABS: 20.0000 mg | ORAL_TABLET | Freq: Every day | ORAL | Status: DC

## 2013-06-14 MED ORDER — TRAMADOL HCL 50 MG PO TABS: 50.0000 mg | ORAL_TABLET | Freq: Four times a day (QID) | ORAL | Status: DC | PRN

## 2013-06-14 MED ORDER — CLINDAMYCIN HCL 300 MG PO CAPS
300.0000 mg | ORAL_CAPSULE | Freq: Four times a day (QID) | ORAL | Status: DC
Start: 2013-06-14 — End: 2013-09-14

## 2013-06-14 NOTE — ED Provider Notes (Signed)
CSN: [id number]     Arrival date & time 06/14/13  1241 History   First MD Initiated Contact with Patient 06/14/13 1301     No chief complaint on file.    (Consider location/radiation/quality/duration/timing/severity/associated sxs/prior Treatment) HPI Comments: Patient is a 68 year old female with past medical history of nonalcoholic cirrhosis. She presents today with complaints of swelling in her legs that has worsened over the past week. She had a CT scan of her abdomen and pelvis one week ago which was ordered by her gastroenterologist showing portal hypertension, but no significant ascites. Since that time, her legs have become increasingly swollen and states she is now feeling somewhat short of breath. She denies any chest pain, abdominal pain, or fever. She has never had a paracentesis before.  The history is provided by the patient.    Past Medical History  Diagnosis Date  . Hypertension   . Restless leg   . Heart murmur   . Shortness of breath     with exertion   . Sleep apnea 2013    sleep study at [facility]    uses cpap  . Diabetes mellitus     no meds  . 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  platelets  low... wbc also low per pt ..   . Blood dyscrasia     low platelets ... low wbc.  ? liver  disease.   . H/O hiatal hernia   . Headache(784.0)   . Anxiety   . Depression   . Chronic kidney disease 50's    nephritis--none at present *12/03/2011)  . Cancer 2005    left breast  . Platelets decreased     ??   D/T CIRRHOISIS  . Family history of anesthesia complication     Daughter has complications with Versed  . PSVT (paroxysmal supraventricular tachycardia)   . Portal hypertension    Past Surgical History  Procedure Laterality Date  . Cholecystectomy    . Appendectomy    . Tonsillectomy    . Abdominal hysterectomy    . Cardiac catheterization  1975    normal cath  -low potassium  . Orif humerus fracture  06/19/2011    Procedure: OPEN REDUCTION INTERNAL FIXATION  (ORIF) DISTAL HUMERUS FRACTURE;  Surgeon: [name], MD;  Location: [place];  Service: Orthopedics;  Laterality: Left;  . Breast surgery      left lumpectomy--few nodes removed  . Mastectomy    . Orif humerus fracture  12/11/2011    Procedure: OPEN REDUCTION INTERNAL FIXATION (ORIF) HUMERAL SHAFT FRACTURE;  Surgeon: [name], MD;  Location: [place];  Service: Orthopedics;  Laterality: Left;   Removal of Hardware left humerus, Open Reduction Internal Fixation Left Humeral Shaft Fracture Non-Union  . US echocardiography  03/16/2011    mild concentric LVH,mod LAE,stage I diastolic dysfunction,MAC,mild MR,aortic sclerosis,mild TR  . Nm myocar perf wall motion  08/17/2008    mild to mod. superimposed ischemiamid antereoseptal,apical anterior & apical regions   Family History  Problem Relation Age of Onset  . Anesthesia problems Daughter   . Stroke Father   . Hyperlipidemia Brother    History  Substance Use Topics  . Smoking status: Never Smoker   . Smokeless tobacco: Never Used  . Alcohol Use: No   OB History   Grav Para Term Preterm Abortions TAB SAB Ect Mult Living                 Review of Systems  All other systems reviewed and are negative.     Allergies  Penicillins; Aspirin; Midazolam hcl; and Niacin and related  Home Medications   Current Outpatient Rx  Name  Route  Sig  Dispense  Refill  . albuterol (PROVENTIL HFA;VENTOLIN HFA) 108 (90 BASE) MCG/ACT inhaler   Inhalation   Inhale 2 puffs into the lungs every 6 (six) hours as needed for wheezing or shortness of breath.         . BuPROPion HCl (WELLBUTRIN XL PO)   Oral   Take 75 mg by mouth 3 (three) times daily as needed (anxiety).          . diazepam (VALIUM) 5 MG tablet   Oral   Take 5 mg by mouth every 6 (six) hours as needed for anxiety.          . fentaNYL (DURAGESIC - DOSED MCG/HR) 75 MCG/HR   Transdermal   Place 1 patch onto the skin every 3 (three) days.         [name]  HYDROcodone-acetaminophen (NORCO/VICODIN) 5-325 MG per tablet   Oral   Take 2 tablets by mouth as needed.          . lactulose (CHRONULAC) 10 GM/15ML solution   Oral   Take 45 g by mouth daily as needed for mild constipation.          . potassium chloride SA (K-DUR,KLOR-CON) 20 MEQ tablet   Oral   Take 20 mEq by mouth daily as needed (weak). Take 2 tablets today, then 1 tablet daily.         . promethazine (PHENERGAN) 12.5 MG tablet   Oral   Take 12.5 mg by mouth every 6 (six) hours as needed for nausea.          . propranolol (INDERAL) 10 MG tablet   Oral   Take 1 tablet (10 mg total) by mouth 2 (two) times daily.   60 tablet   6     To replace nadolol   . rOPINIRole (REQUIP) 1 MG tablet      Take 1 tablet 1-3 hours prior to bedtime   30 tablet   6   . traZODone (DESYREL) 50 MG tablet   Oral   Take 50 mg by mouth at bedtime.          BP 138/64  Pulse 75  Temp(Src) 98 F (36.7 C) (Oral)  Resp 17  Ht 5\' 8"  (1.727 m)  Wt 215 lb 9.6 oz (97.796 kg)  BMI 32.79 kg/m2  SpO2 100% Physical Exam  Nursing note and vitals reviewed. Constitutional: She is oriented to person, place, and time. She appears well-developed and well-nourished. No distress.  HENT:  Head: Normocephalic and atraumatic.  Mouth/Throat: Oropharynx is clear and moist.  Neck: Normal range of motion. Neck supple.  Cardiovascular: Normal rate and regular rhythm.  Exam reveals no gallop and no friction rub.   No murmur heard. Pulmonary/Chest: Effort normal and breath sounds normal. No respiratory distress. She has no wheezes.  Abdominal: Soft. Bowel sounds are normal. She exhibits no distension and no mass. There is no tenderness.  There is no ascites palpable and no fluid wave.  Musculoskeletal: Normal range of motion. She exhibits edema.  There is 2-3+ pitting edema of the bilateral lower extremities.  Neurological: She is alert and oriented to person, place, and time.  Skin: Skin is warm and  dry. She is not diaphoretic.  There is slight jaundice present.  ED Course  Procedures (including critical care time) Labs Review Labs Reviewed  CBC  BASIC METABOLIC PANEL   Imaging Review No results found.   EKG Interpretation   Date/Time:  Sunday June 14 2013 12:47:24 EDT Ventricular Rate:  72 PR Interval:  160 QRS Duration: 92 QT Interval:  430 QTC Calculation: 470 R Axis:   -19 Text Interpretation:  Normal sinus rhythm Cannot rule out Anterior infarct  , age undetermined Abnormal ECG Confirmed by Beau Fanny  MD, Shailey Butterbaugh ([id number]) on  06/14/2013 2:12:27 PM      MDM   Final diagnoses:  None    Patient is a 68 year old female with history of non-alcoholic cirrhosis. She presents today with complaints of swelling in her lower extremities. This is making it difficult for her to ambulate and she also feels short of breath. X-ray reveals no evidence for congestive heart failure her BNP is 62. Liver functions are consistent with baseline and there is no renal insufficiency. At this point I feel as though she is appropriate for discharge. I will prescribe a low dose of Lasix to help with her swelling. She is advised to followup with her GI doctor in the next week and return to the ER for symptoms substantially worsen or change. She also reports she is having some dental pain. Her lateral incisor is somewhat decayed and has been giving her trouble as of late. I will prescribe her pain medication and antibiotics for this. She will be advised to followup with her dentist if not improving.    Veryl Speak, MD 06/14/13 1536

## 2013-06-14 NOTE — ED Notes (Signed)
Pt has cirrhosis and this week she had a ct scan that showed portal hypertension. She states since then the swelling in her extremities is worse, shes noticed a rash on her legs and she feels SOB. She c/o pain in BLE

## 2013-06-14 NOTE — Discharge Instructions (Signed)
Lasix 20 mg daily.  Clindamycin as prescribed. Tramadol as prescribed as needed for pain.  Followup with your gastroenterologist in the next week for reevaluation and return to the emergency department if your symptoms substantially worsen or change.   Edema Edema is an abnormal build-up of fluids in tissues. Because this is partly dependent on gravity (water flows to the lowest place), it is more common in the legs and thighs (lower extremities). It is also common in the looser tissues, like around the eyes. Painless swelling of the feet and ankles is common and increases as a person ages. It may affect both legs and may include the calves or even thighs. When squeezed, the fluid may move out of the affected area and may leave a dent for a few moments. CAUSES   Prolonged standing or sitting in one place for extended periods of time. Movement helps pump tissue fluid into the veins, and absence of movement prevents this, resulting in edema.  Varicose veins. The valves in the veins do not work as well as they should. This causes fluid to leak into the tissues.  Fluid and salt overload.  Injury, burn, or surgery to the leg, ankle, or foot, may damage veins and allow fluid to leak out.  Sunburn damages vessels. Leaky vessels allow fluid to go out into the sunburned tissues.  Allergies (from insect bites or stings, medications or chemicals) cause swelling by allowing vessels to become leaky.  Protein in the blood helps keep fluid in your vessels. Low protein, as in malnutrition, allows fluid to leak out.  Hormonal changes, including pregnancy and menstruation, cause fluid retention. This fluid may leak out of vessels and cause edema.  Medications that cause fluid retention. Examples are sex hormones, blood pressure medications, steroid treatment, or anti-depressants.  Some illnesses cause edema, especially heart failure, kidney disease, or liver disease.  Surgery that cuts veins or lymph  nodes, such as surgery done for the heart or for breast cancer, may result in edema. DIAGNOSIS  Your caregiver is usually easily able to determine what is causing your swelling (edema) by simply asking what is wrong (getting a history) and examining you (doing a physical). Sometimes x-rays, EKG (electrocardiogram or heart tracing), and blood work may be done to evaluate for underlying medical illness. TREATMENT  General treatment includes:  Leg elevation (or elevation of the affected body part).  Restriction of fluid intake.  Prevention of fluid overload.  Compression of the affected body part. Compression with elastic bandages or support stockings squeezes the tissues, preventing fluid from entering and forcing it back into the blood vessels.  Diuretics (also called water pills or fluid pills) pull fluid out of your body in the form of increased urination. These are effective in reducing the swelling, but can have side effects and must be used only under your caregiver's supervision. Diuretics are appropriate only for some types of edema. The specific treatment can be directed at any underlying causes discovered. Heart, liver, or kidney disease should be treated appropriately. HOME CARE INSTRUCTIONS   Elevate the legs (or affected body part) above the level of the heart, while lying down.  Avoid sitting or standing still for prolonged periods of time.  Avoid putting anything directly under the knees when lying down, and do not wear constricting clothing or garters on the upper legs.  Exercising the legs causes the fluid to work back into the veins and lymphatic channels. This may help the swelling go down.  The pressure  applied by elastic bandages or support stockings can help reduce ankle swelling.  A low-salt diet may help reduce fluid retention and decrease the ankle swelling.  Take any medications exactly as prescribed. SEEK MEDICAL CARE IF:  Your edema is not responding to  recommended treatments. SEEK IMMEDIATE MEDICAL CARE IF:   You develop shortness of breath or chest pain.  You cannot breathe when you lay down; or if, while lying down, you have to get up and go to the window to get your breath.  You are having increasing swelling without relief from treatment.  You develop a fever over 102 F (38.9 C).  You develop pain or redness in the areas that are swollen.  Tell your caregiver right away if you have gained 03 lb/1.4 kg in 1 day or 05 lb/2.3 kg in a week. MAKE SURE YOU:   Understand these instructions.  Will watch your condition.  Will get help right away if you are not doing well or get worse. Document Released: 02/19/2005 Document Revised: 08/21/2011 Document Reviewed: 10/08/2007 [facility] Patient Information 2014 [place].  Dental Pain A tooth ache may be caused by cavities (tooth decay). Cavities expose the nerve of the tooth to air and hot or cold temperatures. It may come from an infection or abscess (also called a boil or furuncle) around your tooth. It is also often caused by dental caries (tooth decay). This causes the pain you are having. DIAGNOSIS  Your caregiver can diagnose this problem by exam. TREATMENT   If caused by an infection, it may be treated with medications which kill germs (antibiotics) and pain medications as prescribed by your caregiver. Take medications as directed.  Only take over-the-counter or prescription medicines for pain, discomfort, or fever as directed by your caregiver.  Whether the tooth ache today is caused by infection or dental disease, you should see your dentist as soon as possible for further care. SEEK MEDICAL CARE IF: The exam and treatment you received today has been provided on an emergency basis only. This is not a substitute for complete medical or dental care. If your problem worsens or new problems (symptoms) appear, and you are unable to meet with your dentist, call or return to  this location. SEEK IMMEDIATE MEDICAL CARE IF:   You have a fever.  You develop redness and swelling of your face, jaw, or neck.  You are unable to open your mouth.  You have severe pain uncontrolled by pain medicine. MAKE SURE YOU:   Understand these instructions.  Will watch your condition.  Will get help right away if you are not doing well or get worse. Document Released: 02/19/2005 Document Revised: 05/14/2011 Document Reviewed: 10/08/2007 [facility] Patient Information 2014 [place].

## 2013-06-23 ENCOUNTER — Other Ambulatory Visit: Payer: Self-pay | Admitting: Cardiovascular Disease

## 2013-07-07 ENCOUNTER — Ambulatory Visit: Payer: Medicare Other | Admitting: Cardiovascular Disease

## 2013-07-09 ENCOUNTER — Encounter (HOSPITAL_COMMUNITY): Payer: Self-pay | Admitting: *Deleted

## 2013-07-09 ENCOUNTER — Observation Stay (HOSPITAL_COMMUNITY)
Admission: AD | Admit: 2013-07-09 | Discharge: 2013-07-09 | Disposition: A | Discharge status: Home / Self Care | Payer: Medicare Other | Source: Other Acute Inpatient Hospital | Attending: Internal Medicine | Admitting: Internal Medicine

## 2013-07-09 DIAGNOSIS — G4733 Obstructive sleep apnea (adult) (pediatric): Secondary | ICD-10-CM | POA: Diagnosis present

## 2013-07-09 DIAGNOSIS — K7460 Unspecified cirrhosis of liver: Secondary | ICD-10-CM | POA: Diagnosis present

## 2013-07-09 DIAGNOSIS — Z9010 Acquired absence of unspecified breast and nipple: Secondary | ICD-10-CM | POA: Insufficient documentation

## 2013-07-09 DIAGNOSIS — S42309K Unspecified fracture of shaft of humerus, unspecified arm, subsequent encounter for fracture with nonunion: Secondary | ICD-10-CM

## 2013-07-09 DIAGNOSIS — R531 Weakness: Secondary | ICD-10-CM | POA: Diagnosis present

## 2013-07-09 DIAGNOSIS — I471 Supraventricular tachycardia, unspecified: Secondary | ICD-10-CM | POA: Insufficient documentation

## 2013-07-09 DIAGNOSIS — D696 Thrombocytopenia, unspecified: Secondary | ICD-10-CM | POA: Diagnosis present

## 2013-07-09 DIAGNOSIS — J45909 Unspecified asthma, uncomplicated: Secondary | ICD-10-CM | POA: Insufficient documentation

## 2013-07-09 DIAGNOSIS — I10 Essential (primary) hypertension: Secondary | ICD-10-CM | POA: Diagnosis present

## 2013-07-09 DIAGNOSIS — K08109 Complete loss of teeth, unspecified cause, unspecified class: Secondary | ICD-10-CM

## 2013-07-09 DIAGNOSIS — Y92009 Unspecified place in unspecified non-institutional (private) residence as the place of occurrence of the external cause: Secondary | ICD-10-CM

## 2013-07-09 DIAGNOSIS — D61818 Other pancytopenia: Secondary | ICD-10-CM | POA: Insufficient documentation

## 2013-07-09 DIAGNOSIS — G2581 Restless legs syndrome: Secondary | ICD-10-CM | POA: Insufficient documentation

## 2013-07-09 DIAGNOSIS — N189 Chronic kidney disease, unspecified: Secondary | ICD-10-CM | POA: Insufficient documentation

## 2013-07-09 DIAGNOSIS — R5381 Other malaise: Secondary | ICD-10-CM

## 2013-07-09 DIAGNOSIS — W19XXXA Unspecified fall, initial encounter: Secondary | ICD-10-CM

## 2013-07-09 DIAGNOSIS — R5383 Other fatigue: Secondary | ICD-10-CM

## 2013-07-09 DIAGNOSIS — Z853 Personal history of malignant neoplasm of breast: Secondary | ICD-10-CM | POA: Insufficient documentation

## 2013-07-09 DIAGNOSIS — K766 Portal hypertension: Secondary | ICD-10-CM | POA: Insufficient documentation

## 2013-07-09 DIAGNOSIS — F3289 Other specified depressive episodes: Secondary | ICD-10-CM | POA: Insufficient documentation

## 2013-07-09 DIAGNOSIS — R011 Cardiac murmur, unspecified: Secondary | ICD-10-CM | POA: Insufficient documentation

## 2013-07-09 DIAGNOSIS — F411 Generalized anxiety disorder: Secondary | ICD-10-CM | POA: Insufficient documentation

## 2013-07-09 DIAGNOSIS — I341 Nonrheumatic mitral (valve) prolapse: Secondary | ICD-10-CM

## 2013-07-09 DIAGNOSIS — F329 Major depressive disorder, single episode, unspecified: Secondary | ICD-10-CM | POA: Insufficient documentation

## 2013-07-09 DIAGNOSIS — Z9181 History of falling: Secondary | ICD-10-CM | POA: Insufficient documentation

## 2013-07-09 DIAGNOSIS — K08409 Partial loss of teeth, unspecified cause, unspecified class: Secondary | ICD-10-CM | POA: Diagnosis present

## 2013-07-09 DIAGNOSIS — M6281 Muscle weakness (generalized): Principal | ICD-10-CM | POA: Insufficient documentation

## 2013-07-09 DIAGNOSIS — I129 Hypertensive chronic kidney disease with stage 1 through stage 4 chronic kidney disease, or unspecified chronic kidney disease: Secondary | ICD-10-CM | POA: Insufficient documentation

## 2013-07-09 DIAGNOSIS — D62 Acute posthemorrhagic anemia: Secondary | ICD-10-CM

## 2013-07-09 LAB — CBC WITH DIFFERENTIAL/PLATELET
BASOS PCT: 0 % (ref 0–1)
Basophils Absolute: 0 10*3/uL (ref 0.0–0.1)
EOS PCT: 0 % (ref 0–5)
Eosinophils Absolute: 0 10*3/uL (ref 0.0–0.7)
HCT: 32.1 % — ABNORMAL LOW (ref 36.0–46.0)
HEMOGLOBIN: 10.5 g/dL — AB (ref 12.0–15.0)
LYMPHS PCT: 41 % (ref 12–46)
Lymphs Abs: 0.8 10*3/uL (ref 0.7–4.0)
MCH: 30.6 pg (ref 26.0–34.0)
MCHC: 32.7 g/dL (ref 30.0–36.0)
MCV: 93.6 fL (ref 78.0–100.0)
MONOS PCT: 13 % — AB (ref 3–12)
Monocytes Absolute: 0.3 10*3/uL (ref 0.1–1.0)
NEUTROS ABS: 0.9 10*3/uL — AB (ref 1.7–7.7)
NEUTROS PCT: 46 % (ref 43–77)
Platelets: 40 10*3/uL — ABNORMAL LOW (ref 150–400)
RBC: 3.43 MIL/uL — AB (ref 3.87–5.11)
RDW: 16.8 % — ABNORMAL HIGH (ref 11.5–15.5)
WBC: 2 10*3/uL — ABNORMAL LOW (ref 4.0–10.5)

## 2013-07-09 LAB — BASIC METABOLIC PANEL
BUN: 12 mg/dL (ref 6–23)
CHLORIDE: 108 meq/L (ref 96–112)
CO2: 25 meq/L (ref 19–32)
Calcium: 8.3 mg/dL — ABNORMAL LOW (ref 8.4–10.5)
Creatinine, Ser: 0.66 mg/dL (ref 0.50–1.10)
GFR calc Af Amer: >90 mL/min (ref >90)
GFR calc non Af Amer: >90 mL/min (ref >90)
GLUCOSE: 106 mg/dL — AB (ref 70–99)
POTASSIUM: 3.9 meq/L (ref 3.7–5.3)
SODIUM: 142 meq/L (ref 137–147)

## 2013-07-09 MED ORDER — SODIUM CHLORIDE 0.9 % IV SOLN: 250.0000 mL | INTRAVENOUS | Status: DC | PRN

## 2013-07-09 MED ORDER — SODIUM CHLORIDE 0.9 % IJ SOLN: 3.0000 mL | Freq: Two times a day (BID) | INTRAMUSCULAR | Status: DC

## 2013-07-09 MED ORDER — HYDROCODONE-ACETAMINOPHEN 5-325 MG PO TABS
1.0000 | ORAL_TABLET | ORAL | Status: DC | PRN
  Administered 2013-07-09 (×2): 1 via ORAL
  Filled 2013-07-09 (×2): qty 1

## 2013-07-09 MED ORDER — SODIUM CHLORIDE 0.9 % IJ SOLN: 3.0000 mL | INTRAMUSCULAR | Status: DC | PRN

## 2013-07-09 MED ORDER — ONDANSETRON HCL 4 MG/2ML IJ SOLN: 4.0000 mg | Freq: Four times a day (QID) | INTRAMUSCULAR | Status: DC | PRN

## 2013-07-09 MED ORDER — ROPINIROLE HCL 1 MG PO TABS
1.0000 mg | ORAL_TABLET | Freq: Once | ORAL | Status: AC
  Administered 2013-07-09: 1 mg via ORAL
  Filled 2013-07-09: qty 1

## 2013-07-09 MED ORDER — ONDANSETRON HCL 4 MG PO TABS: 4.0000 mg | ORAL_TABLET | Freq: Four times a day (QID) | ORAL | Status: DC | PRN

## 2013-07-09 NOTE — Progress Notes (Signed)
Patient discharged.  Educated on discharge instructions, discharge medications, and follow-up appointments.  Patient already has appointment scheduled with PCP. No new prescriptions.  Patient belongings gathered.  IV removed.  Patient escorted to car via wheelchair with NT.

## 2013-07-09 NOTE — H&P (Signed)
PCP:   Sheela Stack, MD   Chief Complaint:  weakness  HPI: 68 yo female h/o NASH cirrhosis with pancytopenia, dm, ckd, htn went to [facility] ED around 4pm yesterday due to feeling weak and she fell at home.  No head injury.  She had her tooth extracted on Tuesday and her plts were 40, she had one unit of platelets transfused before her procedure.  Despite the transfusion of platelets she reports that she still bleed a lot from her tooth Tuesday.  So she thought she had lost a lot of blood and that was why she was so weak.  No fevers.  No cough.  No sob.  No n/v.  No swelling.  No LOC.  No weakness anywhere now focally.  No n/t anywhere.  Pt feels fine now and is wondering why she was transferred to [place].  She was told at the ED in [place] was because she had "bands" and the EDP there was concerned for infection.  Pt denies any rashes, her tooth is healing well.  No dysuria or urinary symptoms.  She was given levaquin for possible pna.  She has not had any bleeding since yesterday.  hgb over 11.  plts 70.  Wbc 3.1 with bandemia.  cxr normal.  Review of Systems:  Positive and negative as per HPI otherwise all other systems are negative  Past Medical History: Past Medical History  Diagnosis Date  . Hypertension   . Restless leg   . Heart murmur   . Shortness of breath     with exertion   . Sleep apnea 2013    sleep study at [facility]    uses cpap  . Diabetes mellitus     no meds  . 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  platelets  low... wbc also low per pt ..   . Blood dyscrasia     low platelets ... low wbc.  ? liver  disease.   . H/O hiatal hernia   . Headache(784.0)   . Anxiety   . Depression   . Chronic kidney disease 50's    nephritis--none at present *12/03/2011)  . Cancer 2005    left breast  . Platelets decreased     ??   D/T CIRRHOISIS  . Family history of anesthesia complication     Daughter has complications with Versed  . PSVT (paroxysmal supraventricular  tachycardia)   . Portal hypertension    Past Surgical History  Procedure Laterality Date  . Cholecystectomy    . Appendectomy    . Tonsillectomy    . Abdominal hysterectomy    . Cardiac catheterization  1975    normal cath  -low potassium  . Orif humerus fracture  06/19/2011    Procedure: OPEN REDUCTION INTERNAL FIXATION (ORIF) DISTAL HUMERUS FRACTURE;  Surgeon: [name], MD;  Location: [place];  Service: Orthopedics;  Laterality: Left;  . Breast surgery      left lumpectomy--few nodes removed  . Mastectomy    . Orif humerus fracture  12/11/2011    Procedure: OPEN REDUCTION INTERNAL FIXATION (ORIF) HUMERAL SHAFT FRACTURE;  Surgeon: [name], MD;  Location: [place];  Service: Orthopedics;  Laterality: Left;   Removal of Hardware left humerus, Open Reduction Internal Fixation Left Humeral Shaft Fracture Non-Union  . US echocardiography  03/16/2011    mild concentric LVH,mod LAE,stage I diastolic dysfunction,MAC,mild MR,aortic sclerosis,mild TR  . Nm myocar perf wall motion  08/17/2008    mild  to mod. superimposed ischemiamid antereoseptal,apical anterior & apical regions    Medications: Prior to Admission medications   Medication Sig Start Date End Date Taking? Authorizing Provider  albuterol (PROVENTIL HFA;VENTOLIN HFA) 108 (90 BASE) MCG/ACT inhaler Inhale 2 puffs into the lungs every 6 (six) hours as needed for wheezing or shortness of breath.    Historical Provider, MD  BuPROPion HCl (WELLBUTRIN XL PO) Take 75 mg by mouth 3 (three) times daily as needed (anxiety).     Historical Provider, MD  clindamycin (CLEOCIN) 300 MG capsule Take 1 capsule (300 mg total) by mouth 4 (four) times daily. X 7 days 06/14/13   Veryl Speak, MD  diazepam (VALIUM) 5 MG tablet Take 5 mg by mouth every 6 (six) hours as needed for anxiety.     Historical Provider, MD  fentaNYL (DURAGESIC - DOSED MCG/HR) 75 MCG/HR Place 1 patch onto the skin every 3 (three) days.    Historical Provider,  MD  furosemide (LASIX) 20 MG tablet Take 1 tablet (20 mg total) by mouth daily. 06/14/13   Veryl Speak, MD  HYDROcodone-acetaminophen (NORCO/VICODIN) 5-325 MG per tablet Take 2 tablets by mouth as needed.     Historical Provider, MD  lactulose (CHRONULAC) 10 GM/15ML solution Take 45 g by mouth daily as needed for mild constipation.     Historical Provider, MD  potassium chloride SA (K-DUR,KLOR-CON) 20 MEQ tablet Take 20 mEq by mouth daily as needed (weak). Take 2 tablets today, then 1 tablet daily. 01/14/13   Troy Sine, MD  promethazine (PHENERGAN) 12.5 MG tablet Take 12.5 mg by mouth every 6 (six) hours as needed for nausea.     Historical Provider, MD  propranolol (INDERAL) 10 MG tablet Take 1 tablet (10 mg total) by mouth 2 (two) times daily. 06/01/13   Mihai Croitoru, MD  rOPINIRole (REQUIP) 1 MG tablet TAKE 1 TABLET BY MOUTH 1 TO 3 HOURS PRIOR TO BEDTIME    Troy Sine, MD  traMADol (ULTRAM) 50 MG tablet Take 1 tablet (50 mg total) by mouth every 6 (six) hours as needed. 06/14/13   Veryl Speak, MD  traZODone (DESYREL) 50 MG tablet Take 50 mg by mouth at bedtime.    Historical Provider, MD    Allergies:   Allergies  Allergen Reactions  . Penicillins Anaphylaxis, Itching and Swelling  . Aspirin     Has liver disease  . Midazolam Hcl Other (See Comments)    Daughter has allergy, doctors advised to warn against using.Marland KitchenMarland KitchenPropofol is okay to use.  . Niacin And Related Itching    Feels like skin is on fire    Social History:  reports that she has never smoked. She has never used smokeless tobacco. She reports that she does not drink alcohol or use illicit drugs.  Family History: Family History  Problem Relation Age of Onset  . Anesthesia problems Daughter   . Stroke Father   . Hyperlipidemia Brother     Physical Exam: Filed Vitals:   07/09/13 0317  BP: 136/53  Pulse: 59  Temp: 98.4 F (36.9 C)  TempSrc: Oral  Resp: 16  Height: 5\' 8"  (1.727 m)  Weight: 92.6 kg (204 lb  2.3 oz)  SpO2: 100%   General appearance: alert, cooperative and no distress Head: Normocephalic, without obvious abnormality, atraumatic Eyes: negative Nose: Nares normal. Septum midline. Mucosa normal. No drainage or sinus tenderness. Neck: no JVD and supple, symmetrical, trachea midline Lungs: clear to auscultation bilaterally Heart: regular rate and rhythm, S1,  S2 normal, no murmur, click, rub or gallop Abdomen: soft, non-tender; bowel sounds normal; no masses,  no organomegaly Extremities: extremities normal, atraumatic, no cyanosis or edema Pulses: 2+ and symmetric Skin: Skin color, texture, turgor normal. No rashes or lesions Neurologic: Grossly normal   Labs on Admission:   reviewed   Radiological Exams on Admission: reviewed   Assessment/Plan  68 yo female with NASH, generalized weakness with recent tooth extraction transferred to [place] for presumptive bandemia/left shift  Principal Problem:   Generalized weakness-  This has resolved.  Will ck orthostatics.  Adjust chronic meds if needed.  Awaiting med rec by pharm.  hgb over 11 so not significantly anemic.   Active Problems:   Thrombocytopenia, unspecified   OSA (obstructive sleep apnea)   Cirrhosis, non-alcoholic   HTN (hypertension)   Fall at home   S/P tooth extraction  bandemia-  Pt given levaquin for pna????  In the setting of no symptoms of pna or other infectious source will not continue abx.  cxr shows no infiltrate, pt with no symptoms of pna or uti.  Repeat cbc with diff later today.  Pt can possibly be discharged later today if she continues to be asymptomatic of any infection or weakness.  Gabriela Irigoyen A Shanon Brow 07/09/2013, 3:54 AM

## 2013-07-09 NOTE — Progress Notes (Addendum)
New Admission Note:   Arrival: via [facility] with 4 transporters Mental Orientation: A&Ox4 Telemetry: none ordered currently Assessment:  See doc flowsheet Skin: bruises on arms d/t recent fall, small scab on left leg and right leg, left arm incision d/t old fall, left breast incision d/t mastectomy, redness in abdominal folds, bandage on right wrist IV: right AC IV, saline locked, dry, clean, intact Pain: c/o headache, tooth pain Safety Measures:  Call bell placed within reach; patient instructed on use of call bell and verbalized understanding. Bed in lowest position.  Yellow bracelet on.  Non-skid socks on. Bed alarm on. Restricted bracelet placed on left arm. [address] [place] Orientation: Patient oriented to staff, unit, and room. Family: none at bedside  No current orders. Will continue to monitor.  Arlyss Queen, RN, BSN

## 2013-07-09 NOTE — Discharge Summary (Addendum)
Physician Discharge Summary  [name] PVV:[id number] DOB: 1945-10-12 DOA: 07/09/2013  PCP: Sheela Stack, MD  Admit date: 07/09/2013 Discharge date: 07/09/2013  Time spent: >45 minutes   Discharge Diagnoses:  Principal Problem:   Generalized weakness Active Problems:  S/P tooth extraction- with continuous bleeding   Thrombocytopenia, unspecified   OSA (obstructive sleep apnea)   Cirrhosis, non-alcoholic   HTN (hypertension)   Fall at home  Discharge Condition: stable  Diet recommendation: heart healthy  Filed Weights   07/09/13 0317  Weight: 92.6 kg (204 lb 2.3 oz)    History of present illness:  68 yo female h/o NASH cirrhosis with pancytopenia, dm, ckd, htn went to [facility] ED around 4pm yesterday due to feeling weak and she fell at home. No head injury. She had her tooth extracted on Tuesday and her plts were 40, she had one unit of platelets transfused before her procedure. Despite the transfusion of platelets she reports that she still bleed a lot from her tooth Tuesday. So she thought she had lost a lot of blood and that was why she was so weak. No fevers. No cough. No sob. No n/v. No swelling. No LOC. No weakness anywhere now focally. No n/t anywhere. Pt feels fine now and is wondering why she was transferred to [facility]. She was told at the ED in [place] was because she had "bands" and the EDP there was concerned for infection. Pt denies any rashes, her tooth is healing well. No dysuria or urinary symptoms. She was given levaquin for possible pna. She has not had any bleeding since yesterday. hgb over 11. plts 70. Wbc 3.1 with bandemia. cxr normal   Hospital Course:   Generalized weakness - resolved- cause uncertain as Hgb did not drop   S/p tooth extraction with continuous oozing of blood - resolved  Pancytopenia - stable  All other medical problems have remained stable.    Procedures:  none  Consultations:  none  Discharge Exam: Filed  Vitals:   07/09/13 0855  BP: 154/64  Pulse: 66  Temp: 97.1 F (36.2 C)  Resp: 18    General: AAO x 3 Cardiovascular: RRR, no murmurs Respiratory: CTA b/l   Discharge Instructions You were cared for by a hospitalist during your hospital stay. If you have any questions about your discharge medications or the care you received while you were in the hospital after you are discharged, you can call the unit and asked to speak with the hospitalist on call if the hospitalist that took care of you is not available. Once you are discharged, your primary care physician will handle any further medical issues. Please note that NO REFILLS for any discharge medications will be authorized once you are discharged, as it is imperative that you return to your primary care physician (or establish a relationship with a primary care physician if you do not have one) for your aftercare needs so that they can reassess your need for medications and monitor your lab values.      Discharge Orders   Future Orders Complete By Expires   Diet - low sodium heart healthy  As directed    Increase activity slowly  As directed        Medication List         albuterol 108 (90 BASE) MCG/ACT inhaler  Commonly known as:  PROVENTIL HFA;VENTOLIN HFA  Inhale 2 puffs into the lungs every 6 (six) hours as needed for wheezing or shortness of breath.  AMOXICILLIN PO  Take by mouth.     amoxicillin 500 MG capsule  Commonly known as:  AMOXIL  Take 500-1,000 mg by mouth See admin instructions. Take 2 capsules one hour prior to procedure then 1 capsule three times daily (16 capsules total)     clindamycin 300 MG capsule  Commonly known as:  CLEOCIN  Take 1 capsule (300 mg total) by mouth 4 (four) times daily. X 7 days     diazepam 5 MG tablet  Commonly known as:  VALIUM  Take 5 mg by mouth every 6 (six) hours as needed for anxiety.     fentaNYL 75 MCG/HR  Commonly known as:  DURAGESIC - dosed mcg/hr  Place 1 patch  onto the skin every 3 (three) days.     furosemide 20 MG tablet  Commonly known as:  LASIX  Take 1 tablet (20 mg total) by mouth daily.     HYDROcodone-acetaminophen 5-325 MG per tablet  Commonly known as:  NORCO/VICODIN  Take 1-2 tablets by mouth every 6 (six) hours as needed for moderate pain.     lactulose 10 GM/15ML solution  Commonly known as:  CHRONULAC  Take 45 g by mouth daily as needed for mild constipation.     potassium chloride SA 20 MEQ tablet  Commonly known as:  K-DUR,KLOR-CON  Take 20 mEq by mouth daily as needed (weak).     promethazine 12.5 MG tablet  Commonly known as:  PHENERGAN  Take 12.5 mg by mouth every 6 (six) hours as needed for nausea.     propranolol 10 MG tablet  Commonly known as:  INDERAL  Take 1 tablet (10 mg total) by mouth 2 (two) times daily.     rOPINIRole 1 MG tablet  Commonly known as:  REQUIP  Take 1-2 mg by mouth at bedtime.     traMADol 50 MG tablet  Commonly known as:  ULTRAM  Take 50 mg by mouth every 6 (six) hours as needed for moderate pain.     traZODone 50 MG tablet  Commonly known as:  DESYREL  Take 50 mg by mouth at bedtime.     WELLBUTRIN XL PO  Take 75 mg by mouth 2 (two) times daily as needed (anxiety).       Allergies  Allergen Reactions  . Penicillins Anaphylaxis, Itching and Swelling  . Aspirin     Has liver disease  . Midazolam Hcl Other (See Comments)    Daughter has allergy, doctors advised to warn against using.Marland KitchenMarland KitchenPropofol is okay to use.  . Niacin And Related Itching    Feels like skin is on fire      The results of significant diagnostics from this hospitalization (including imaging, microbiology, ancillary and laboratory) are listed below for reference.    Significant Diagnostic Studies: Dg Chest 2 View  06/14/2013   CLINICAL DATA:  Dyspnea and leg swelling with history of asthma and breast malignancy  EXAM: CHEST  2 VIEW  COMPARISON:  CT ABD/PELVIS W CM dated 06/03/2013; DG CHEST 1V PORT dated  04/18/2012  FINDINGS: The lungs are adequately inflated. There is hazy increased density that projects just above the dome of the right hemidiaphragm on the frontal film which may lie posteriorly on the lateral film and reflect pneumonia. Cardiopericardial silhouette is normal in size. The pulmonary vascularity is not engorged. There is no pleural effusion. The patient has undergone previous left mastectomy. The observed portions of the bony thorax appear normal.  IMPRESSION: Hazy increased density  at the right lung base posteriorly may reflect atelectasis or early pneumonia. There is no evidence of CHF nor of a pleural effusion.   Electronically Signed   By: David  Martinique   On: 06/14/2013 14:35    Microbiology: No results found for this or any previous visit (from the past 240 hour(s)).   Labs: Basic Metabolic Panel:  Recent Labs Lab 07/09/13 0825  NA 142  K 3.9  CL 108  CO2 25  GLUCOSE 106*  BUN 12  CREATININE 0.66  CALCIUM 8.3*   Liver Function Tests: No results found for this basename: AST, ALT, ALKPHOS, BILITOT, PROT, ALBUMIN,  in the last 168 hours No results found for this basename: LIPASE, AMYLASE,  in the last 168 hours No results found for this basename: AMMONIA,  in the last 168 hours CBC:  Recent Labs Lab 07/09/13 0825  WBC 2.0*  NEUTROABS 0.9*  HGB 10.5*  HCT 32.1*  MCV 93.6  PLT 40*   Cardiac Enzymes: No results found for this basename: CKTOTAL, CKMB, CKMBINDEX, TROPONINI,  in the last 168 hours BNP: BNP (last 3 results)  Recent Labs  06/14/13 1405  PROBNP 62.4   CBG: No results found for this basename: GLUCAP,  in the last 168 hours     Signed:  Debbe Odea, MD  [facility] 07/09/2013, 12:46 PM

## 2013-07-10 LAB — PATHOLOGIST SMEAR REVIEW

## 2013-07-22 ENCOUNTER — Other Ambulatory Visit: Payer: Self-pay | Admitting: Cardiovascular Disease

## 2013-07-22 NOTE — Telephone Encounter (Signed)
Deferred to Dr. Arnette Norris to advise on refill

## 2013-07-24 ENCOUNTER — Other Ambulatory Visit: Payer: Self-pay | Admitting: *Deleted

## 2013-07-24 MED ORDER — ROPINIROLE HCL 1 MG PO TABS: 1.0000 mg | ORAL_TABLET | Freq: Every day | ORAL | Status: DC

## 2013-09-14 ENCOUNTER — Other Ambulatory Visit: Payer: Self-pay | Admitting: *Deleted

## 2013-09-14 ENCOUNTER — Telehealth: Payer: Self-pay | Admitting: Cardiovascular Disease

## 2013-09-14 DIAGNOSIS — R5383 Other fatigue: Principal | ICD-10-CM

## 2013-09-14 DIAGNOSIS — R5381 Other malaise: Secondary | ICD-10-CM

## 2013-09-14 NOTE — Telephone Encounter (Signed)
Pt called and said her blood pressure is running real low,she have always high blood pressure. The last reading was 144/50 and pulse rate was 45.She states she has no energy and stays cold all the time.

## 2013-09-14 NOTE — Addendum Note (Signed)
Addended by: Janett Labella A on: 09/14/2013 11:36 AM   Modules accepted: Orders

## 2013-09-14 NOTE — Telephone Encounter (Signed)
New Prob    Pt requesting orders from Dr. Sallyanne Kuster for TSH to be faxed to her GI provider, Dr. Clarene Essex at [phone]. Phone number to [facility] [phone].

## 2013-09-14 NOTE — Telephone Encounter (Signed)
C/O feeling extremely weak and fatigued over the past month.  Stays cold all the time.  Concerned about BP and heart rate ranging from 161/72 HR of 59 down to 80/38 HR of 40.  Medications discussed and corrected in her med list.  She decreased Inderal to daily on her own about a month ago with little change in symptoms.  Will send this info to Dr. Loletha Grayer for review and advise.

## 2013-09-14 NOTE — Telephone Encounter (Signed)
TSH order faxed to Dr. Perley Jain office.

## 2013-09-14 NOTE — Telephone Encounter (Signed)
Patient notified to stop Inderal and have a TSH level checked.  Patient voiced understanding and will schedule a follow-up appt with Dr. Loletha Grayer.

## 2013-09-14 NOTE — Telephone Encounter (Signed)
Please check TSH and stop inderal altogether - does she have an appt?

## 2013-09-17 ENCOUNTER — Ambulatory Visit: Payer: Medicare Other | Admitting: Cardiovascular Disease

## 2013-09-25 ENCOUNTER — Other Ambulatory Visit: Payer: Self-pay | Admitting: Gastroenterology

## 2013-09-25 DIAGNOSIS — K529 Noninfective gastroenteritis and colitis, unspecified: Secondary | ICD-10-CM

## 2013-09-25 DIAGNOSIS — R0989 Other specified symptoms and signs involving the circulatory and respiratory systems: Secondary | ICD-10-CM

## 2013-09-25 DIAGNOSIS — K7460 Unspecified cirrhosis of liver: Secondary | ICD-10-CM

## 2013-10-01 ENCOUNTER — Other Ambulatory Visit: Payer: Self-pay | Admitting: Gastroenterology

## 2013-10-01 ENCOUNTER — Ambulatory Visit
Admission: RE | Admit: 2013-10-01 | Discharge: 2013-10-01 | Disposition: A | Discharge status: Home / Self Care | Payer: Medicare Other | Source: Ambulatory Visit | Attending: Gastroenterology | Admitting: Gastroenterology

## 2013-10-01 DIAGNOSIS — K7460 Unspecified cirrhosis of liver: Secondary | ICD-10-CM

## 2013-10-01 DIAGNOSIS — R0989 Other specified symptoms and signs involving the circulatory and respiratory systems: Secondary | ICD-10-CM

## 2013-10-01 DIAGNOSIS — K529 Noninfective gastroenteritis and colitis, unspecified: Secondary | ICD-10-CM

## 2013-10-01 MED ORDER — IOHEXOL 350 MG/ML SOLN
100.0000 mL | Freq: Once | INTRAVENOUS | Status: AC | PRN
  Administered 2013-10-01: 100 mL via INTRAVENOUS

## 2013-10-19 ENCOUNTER — Encounter (HOSPITAL_COMMUNITY): Payer: Self-pay | Admitting: Pharmacy Technician

## 2013-10-19 ENCOUNTER — Other Ambulatory Visit: Payer: Self-pay | Admitting: Gastroenterology

## 2013-10-19 ENCOUNTER — Encounter (HOSPITAL_COMMUNITY): Payer: Self-pay | Admitting: *Deleted

## 2013-10-19 NOTE — Progress Notes (Signed)
According to pt, her daughter had problems with Versed so she was told not to use it.

## 2013-10-19 NOTE — Addendum Note (Signed)
Addended byClarene Essex on: 10/19/2013 12:44 PM   Modules accepted: Orders

## 2013-10-21 ENCOUNTER — Ambulatory Visit (HOSPITAL_COMMUNITY)
Admission: RE | Admit: 2013-10-21 | Discharge: 2013-10-21 | Disposition: A | Discharge status: Home / Self Care | Payer: Medicare Other | Source: Ambulatory Visit | Attending: Gastroenterology | Admitting: Gastroenterology

## 2013-10-21 ENCOUNTER — Encounter (HOSPITAL_COMMUNITY): Payer: Medicare Other | Admitting: Anesthesiology

## 2013-10-21 ENCOUNTER — Encounter (HOSPITAL_COMMUNITY)
Admission: RE | Disposition: A | Discharge status: Home / Self Care | Payer: Self-pay | Source: Ambulatory Visit | Attending: Gastroenterology

## 2013-10-21 ENCOUNTER — Encounter (HOSPITAL_COMMUNITY): Payer: Self-pay | Admitting: Gastroenterology

## 2013-10-21 ENCOUNTER — Ambulatory Visit (HOSPITAL_COMMUNITY): Payer: Medicare Other | Admitting: Anesthesiology

## 2013-10-21 DIAGNOSIS — F329 Major depressive disorder, single episode, unspecified: Secondary | ICD-10-CM | POA: Diagnosis not present

## 2013-10-21 DIAGNOSIS — J45909 Unspecified asthma, uncomplicated: Secondary | ICD-10-CM | POA: Diagnosis not present

## 2013-10-21 DIAGNOSIS — K766 Portal hypertension: Secondary | ICD-10-CM | POA: Insufficient documentation

## 2013-10-21 DIAGNOSIS — F411 Generalized anxiety disorder: Secondary | ICD-10-CM | POA: Diagnosis not present

## 2013-10-21 DIAGNOSIS — F3289 Other specified depressive episodes: Secondary | ICD-10-CM | POA: Insufficient documentation

## 2013-10-21 DIAGNOSIS — E119 Type 2 diabetes mellitus without complications: Secondary | ICD-10-CM | POA: Diagnosis not present

## 2013-10-21 DIAGNOSIS — Z860100 Personal history of colon polyps, unspecified: Secondary | ICD-10-CM | POA: Insufficient documentation

## 2013-10-21 DIAGNOSIS — K319 Disease of stomach and duodenum, unspecified: Secondary | ICD-10-CM | POA: Insufficient documentation

## 2013-10-21 DIAGNOSIS — K449 Diaphragmatic hernia without obstruction or gangrene: Secondary | ICD-10-CM | POA: Diagnosis not present

## 2013-10-21 DIAGNOSIS — G4730 Sleep apnea, unspecified: Secondary | ICD-10-CM | POA: Diagnosis not present

## 2013-10-21 DIAGNOSIS — R197 Diarrhea, unspecified: Secondary | ICD-10-CM | POA: Insufficient documentation

## 2013-10-21 DIAGNOSIS — I10 Essential (primary) hypertension: Secondary | ICD-10-CM | POA: Diagnosis not present

## 2013-10-21 HISTORY — PX: COLONOSCOPY WITH PROPOFOL: SHX5780

## 2013-10-21 HISTORY — DX: Unspecified asthma, uncomplicated: J45.909

## 2013-10-21 HISTORY — PX: ESOPHAGOGASTRODUODENOSCOPY (EGD) WITH PROPOFOL: SHX5813

## 2013-10-21 SURGERY — ESOPHAGOGASTRODUODENOSCOPY (EGD) WITH PROPOFOL
Anesthesia: Monitor Anesthesia Care

## 2013-10-21 MED ORDER — SODIUM CHLORIDE 0.9 % IV SOLN: INTRAVENOUS | Status: DC

## 2013-10-21 MED ORDER — LACTATED RINGERS IV SOLN
INTRAVENOUS | Status: DC | PRN
  Administered 2013-10-21: 09:00:00 via INTRAVENOUS

## 2013-10-21 MED ORDER — PROPOFOL INFUSION 10 MG/ML OPTIME
INTRAVENOUS | Status: DC | PRN
  Administered 2013-10-21: 50 ug/kg/min via INTRAVENOUS

## 2013-10-21 MED ORDER — FENTANYL CITRATE 0.05 MG/ML IJ SOLN
INTRAMUSCULAR | Status: DC | PRN
  Administered 2013-10-21 (×3): 50 ug via INTRAVENOUS

## 2013-10-21 MED ORDER — MIDAZOLAM HCL 5 MG/5ML IJ SOLN
INTRAMUSCULAR | Status: DC | PRN
  Administered 2013-10-21: 2 mg via INTRAVENOUS

## 2013-10-21 MED ORDER — LIDOCAINE HCL (CARDIAC) 20 MG/ML IV SOLN
INTRAVENOUS | Status: DC | PRN
  Administered 2013-10-21: 40 mg via INTRAVENOUS

## 2013-10-21 MED ORDER — PROPOFOL 10 MG/ML IV BOLUS
INTRAVENOUS | Status: DC | PRN
  Administered 2013-10-21 (×2): 50 mg via INTRAVENOUS
  Administered 2013-10-21: 100 mg via INTRAVENOUS
  Administered 2013-10-21: 50 mg via INTRAVENOUS

## 2013-10-21 NOTE — Progress Notes (Signed)
[name] 9:08 AM  Subjective: Patient is actually doing better overall since I saw her in the office without any new complaints and her swelling is actually but she still has her episodic diarrhea  Objective: Vital signs stable afebrile no acute distress exam please see pre-assessment evaluation significant for no obvious ascites or pedal edema  Assessment: Multiple medical problem including abnormal CAT scan diarrhea due for colon screening and cirrhosis Plan: Okay to proceed with colonoscopy and endoscopy using the ultraslim scope with anesthesia assistance  [facility] E

## 2013-10-21 NOTE — Transfer of Care (Signed)
Immediate Anesthesia Transfer of Care Note  Patient: [name]  Procedure(s) Performed: Procedure(s) with comments: ESOPHAGOGASTRODUODENOSCOPY (EGD) WITH PROPOFOL (N/A) COLONOSCOPY WITH PROPOFOL (N/A) - ultra slim scope   Patient Location: PACU and Endoscopy Unit  Anesthesia Type:MAC  Level of Consciousness: awake, alert , oriented and sedated  Airway & Oxygen Therapy: Patient Spontanous Breathing and Patient connected to nasal cannula oxygen  Post-op Assessment: Report given to PACU RN, Post -op Vital signs reviewed and stable and Patient moving all extremities  Post vital signs: Reviewed and stable  Complications: No apparent anesthesia complications

## 2013-10-21 NOTE — Anesthesia Procedure Notes (Signed)
Procedure Name: MAC Date/Time: 10/21/2013 9:21 AM Performed by: Scheryl Darter Pre-anesthesia Checklist: Patient identified, Emergency Drugs available, Suction available, Timeout performed and Patient being monitored Patient Re-evaluated:Patient Re-evaluated prior to inductionOxygen Delivery Method: Nasal cannula Intubation Type: IV induction Placement Confirmation: positive ETCO2

## 2013-10-21 NOTE — Discharge Instructions (Addendum)
Colonoscopy, Care After These instructions give you information on caring for yourself after your procedure. Your doctor may also give you more specific instructions. Call your doctor if you have any problems or questions after your procedure. HOME CARE  Do not drive for 24 hours.  Do not sign important papers or use machinery for 24 hours.  You may shower.  You may go back to your usual activities, but go slower for the first 24 hours.  Take rest breaks often during the first 24 hours.  Walk around or use warm packs on your belly (abdomen) if you have belly cramping or gas.  Drink enough fluids to keep your pee (urine) clear or pale yellow.  Resume your normal diet. Avoid heavy or fried foods.  Avoid drinking alcohol for 24 hours or as told by your doctor.  Only take medicines as told by your doctor. If a tissue sample (biopsy) was taken during the procedure:   Do not take aspirin or blood thinners for 7 days, or as told by your doctor.  Do not drink alcohol for 7 days, or as told by your doctor.  Eat soft foods for the first 24 hours. GET HELP IF: You still have a small amount of blood in your poop (stool) 2-3 days after the procedure. GET HELP RIGHT AWAY IF:  You have more than a small amount of blood in your poop.  You see clumps of tissue (blood clots) in your poop.  Your belly is puffy (swollen).  You feel sick to your stomach (nauseous) or throw up (vomit).  You have a fever.  You have belly pain that gets worse and medicine does not help. MAKE SURE YOU:  Understand these instructions.  Will watch your condition.  Will get help right away if you are not doing well or get worse. Document Released: 03/24/2010 Document Revised: 02/24/2013 Document Reviewed: 10/27/2012 [facility] Patient Information 2015 [place], [place]. This information is not intended to replace advice given to you by your health care provider. Make sure you discuss any questions you have with  your health care provider.  Esophagogastroduodenoscopy Care After Refer to this sheet in the next few weeks. These instructions provide you with information on caring for yourself after your procedure. Your caregiver may also give you more specific instructions. Your treatment has been planned according to current medical practices, but problems sometimes occur. Call your caregiver if you have any problems or questions after your procedure.  HOME CARE INSTRUCTIONS  Do not eat or drink anything until the numbing medicine (local anesthetic) has worn off and your gag reflex has returned. You will know that the local anesthetic has worn off when you can swallow comfortably.  Do not drive for 12 hours after the procedure or as directed by your caregiver.  Only take medicines as directed by your caregiver. SEEK MEDICAL CARE IF:   You cannot stop coughing.  You are not urinating at all or less than usual. SEEK IMMEDIATE MEDICAL CARE IF:  You have difficulty swallowing.  You cannot eat or drink.  You have worsening throat or chest pain.  You have dizziness, lightheadedness, or you faint.  You have nausea or vomiting.  You have chills.  You have a fever.  You have severe abdominal pain.  You have black, tarry, or bloody stools. Document Released: 02/06/2012 Document Reviewed: 02/06/2012 [facility] Patient Information 2015 [place]. This information is not intended to replace advice given to you by your health care provider. Make sure  you discuss any questions you have with your health care provider.  Monitored Anesthesia Care Monitored anesthesia care is an anesthesia service for a medical procedure. Anesthesia is the loss of the ability to feel pain. It is produced by medicines called anesthetics. It may affect a small area of your body (local anesthesia), a large area of your body (regional anesthesia), or your entire body (general anesthesia). The need for monitored anesthesia  care depends your procedure, your condition, and the potential need for regional or general anesthesia. It is often provided during procedures where:   General anesthesia may be needed if there are complications. This is because you need special care when you are under general anesthesia.   You will be under local or regional anesthesia. This is so that you are able to have higher levels of anesthesia if needed.   You will receive calming medicines (sedatives). This is especially the case if sedatives are given to put you in a semi-conscious state of relaxation (deep sedation). This is because the amount of sedative needed to produce this state can be hard to predict. Too much of a sedative can produce general anesthesia. Monitored anesthesia care is performed by one or more health care providers who have special training in all types of anesthesia. You will need to meet with these health care providers before your procedure. During this meeting, they will ask you about your medical history. They will also give you instructions to follow. (For example, you will need to stop eating and drinking before your procedure. You may also need to stop or change medicines you are taking.) During your procedure, your health care providers will stay with you. They will:   Watch your condition. This includes watching your blood pressure, breathing, and level of pain.   Diagnose and treat problems that occur.   Give medicines if they are needed. These may include calming medicines (sedatives) and anesthetics.   Make sure you are comfortable.  Having monitored anesthesia care does not necessarily mean that you will be under anesthesia. It does mean that your health care providers will be able to manage anesthesia if you need it or if it occurs. It also means that you will be able to have a different type of anesthesia than you are having if you need it. When your procedure is complete, your health care  providers will continue to watch your condition. They will make sure any medicines wear off before you are allowed to go home.  Document Released: 11/15/2004 Document Revised: 07/06/2013 Document Reviewed: 04/02/2012 [facility] Patient Information 2015 [place], [place]. This information is not intended to replace advice given to you by your health care provider. Make sure you discuss any questions you have with your health care provider. Call if question or problem otherwise call for biopsy results report in one week and then we will decide followup at that time

## 2013-10-21 NOTE — Op Note (Signed)
[facility] [place], [id number]   ENDOSCOPY PROCEDURE REPORT  PATIENT: [name], [name]  MR#: [id number] BIRTHDATE: 1945-12-04 , 67  yrs. old GENDER: Female  ENDOSCOPIST: Clarene Essex, MD REFERRED OM:[id number] Forde Dandy, M.D.  PROCEDURE DATE:  10/21/2013 PROCEDURE:   EGJ, diagnostic   and biopsy ASA CLASS:   Class III INDICATIONS:Chronic diarrhea and abnormal CT of the GI tract.  MEDICATIONS: propofol (Diprivan) 350mg  IV and Fentanyl 100 mcg IV  TOPICAL ANESTHETIC:not used  DESCRIPTION OF PROCEDURE:   After the risks benefits and alternatives of the procedure were thoroughly explained, informed consent was obtained.  The [id number] ([id number]  endoscope was introduced through the mouth and advanced to the mid jejunum , limited by Without limitations.  we advanced the entire length of the scope and then reduce the loop and then were able to re readvance further each time and then motility took  the scope even further finally we could not advance any further although it's hard to know exactly where we were although on slow withdrawal we did withdrawal 110 cm long.The instrument was slowly withdrawn as the mucosa was fully examined.the findings are recorded below and the patient tolerated the procedure well and there was no obvious immediate complication         FINDINGS:#1. Tiny hiatal hernia without any obvious esophageal varices 2. Minimal portal gastropathy 3. Otherwise within normal limits to at least the mid jejunum status post small bowel biopsy  COMPLICATIONS:none  ENDOSCOPIC IMPRESSION:above   RECOMMENDATIONS:await pathology and consider CT enteroscopy or capsule endoscopy next if needed and follow up based on symptoms when we reviewed the pathology and she knows to call me sooner when necessary   REPEAT EXAM:   _______________________________ Clarene Essex, MD eSigned:  Clarene Essex, MD 10/21/2013 10:31  AM    MO:[id number] Forde Dandy, MD  PATIENT NAME:  Marlyss, Cissell MR#: [id number]

## 2013-10-21 NOTE — Anesthesia Preprocedure Evaluation (Signed)
Anesthesia Evaluation    Reviewed: Allergy & Precautions, H&P , NPO status , Patient's Chart, lab work & pertinent test results  Airway       Dental   Pulmonary shortness of breath, asthma , sleep apnea and Continuous Positive Airway Pressure Ventilation ,          Cardiovascular hypertension,     Neuro/Psych  Headaches, PSYCHIATRIC DISORDERS Anxiety Depression    GI/Hepatic hiatal hernia,   Endo/Other  diabetes  Renal/GU      Musculoskeletal   Abdominal   Peds  Hematology   Anesthesia Other Findings   Reproductive/Obstetrics                           Anesthesia Physical Anesthesia Plan  ASA: III  Anesthesia Plan: General and MAC   Post-op Pain Management:    Induction: Intravenous  Airway Management Planned: Simple Face Mask  Additional Equipment:   Intra-op Plan:   Post-operative Plan:   Informed Consent:   Plan Discussed with:   Anesthesia Plan Comments:         Anesthesia Quick Evaluation

## 2013-10-21 NOTE — Anesthesia Postprocedure Evaluation (Signed)
Anesthesia Post Note  Patient: [name]  Procedure(s) Performed: Procedure(s) (LRB): ESOPHAGOGASTRODUODENOSCOPY (EGD) WITH PROPOFOL (N/A) COLONOSCOPY WITH PROPOFOL (N/A)  Anesthesia type: MAC  Patient location: PACU  Post pain: Pain level controlled  Post assessment: Patient's Cardiovascular Status Stable  Last Vitals:  Filed Vitals:   10/21/13 1055  BP: 139/83  Pulse: 65  Temp:   Resp: 15    Post vital signs: Reviewed and stable  Level of consciousness: sedated  Complications: No apparent anesthesia complications

## 2013-10-21 NOTE — Op Note (Signed)
[facility] [place], [address]   COLONOSCOPY PROCEDURE REPORT  PATIENT: Grace, Carr  MR#: [id number] BIRTHDATE: [birth year] , 67  yrs. old GENDER: Female ENDOSCOPIST: Clarene Essex, MD REFERRED DU:[id number] Forde Dandy, M.D. PROCEDURE DATE:  10/21/2013 PROCEDURE:   Colonoscopy with biopsy ASA CLASS:   Class III INDICATIONS:Chronic diarrhea.  personal history of polyps MEDICATIONS: propofol (Diprivan) 150mg  IV, Fentanyl 150 mcg IV, and Versed 2 mg IV  DESCRIPTION OF PROCEDURE:   After the risks benefits and alternatives of the procedure were thoroughly explained, informed consent was obtained.  The Pentax Ped Colon [id number]  endoscope was introduced through the anus and advanced to the terminal ileum which was intubated for a short distance , limited by No adverse events experienced.   The quality of the prep was adequate. .  The instrument was then slowly withdrawn as the colon was fully examined.findings are recorded below and the patient tolerated the procedure well there was no obvious immediate complication and to advance to the cecum and terminal ileum did not require any position changes or any abdominal pressure and the scope was advanced about 15 cm of the TI       FINDINGS:  1 normal colonoscopy to the terminal ileum with random biopsies in terminal ileum and colon  COMPLICATIONS: none IMPRESSION:  above  RECOMMENDATIONS: await pathology continue workup with an endoscopy using the ultraslim scope and repeat colon screening in 5 years if doing well medically   _______________________________ eSigned:  Clarene Essex, MD 10/21/2013 10:12 AM   CW:[id number] Forde Dandy, MD

## 2013-10-22 ENCOUNTER — Encounter (HOSPITAL_COMMUNITY): Payer: Self-pay | Admitting: Gastroenterology

## 2013-10-26 ENCOUNTER — Ambulatory Visit (INDEPENDENT_AMBULATORY_CARE_PROVIDER_SITE_OTHER): Payer: Medicare Other | Admitting: Cardiovascular Disease

## 2013-10-26 ENCOUNTER — Encounter: Payer: Self-pay | Admitting: Cardiovascular Disease

## 2013-10-26 VITALS — BP 116/62 | HR 67 | Resp 16 | Ht 68.0 in | Wt 204.0 lb

## 2013-10-26 DIAGNOSIS — I471 Supraventricular tachycardia, unspecified: Secondary | ICD-10-CM

## 2013-10-26 DIAGNOSIS — I10 Essential (primary) hypertension: Secondary | ICD-10-CM

## 2013-10-26 NOTE — Patient Instructions (Signed)
Dr Sallyanne Kuster wants you to follow-up in 1 year. You will receive a reminder letter in the mail one to two months in advance. If you don't receive a letter, please call our office to schedule the follow-up appointment.

## 2013-10-26 NOTE — Progress Notes (Signed)
Patient ID: [name], female   DOB: 08-Feb-1946, 68 y.o.   MRN: [id number]     Reason for office visit Atrial tachycardia, mitral valve prolapse     [name]  has a long-standing history of palpitations secondary to isolated PACs and brief episodes of supraventricular tachycardia, probably ectopic atrial tachycardia. She also has non-alcoholic cirrhosis, of uncertain etiology. Previously she had hypertension, but recently her propranolol had to be discontinued for hypotension. Fortunately, her palpitations have not worsened after discontinuing the beta blocker. She was diagnosed as having mitral valve prolapse in the past but her most recent echocardiogram in January 2013 did not describe this abnormality although there was mild mitral insufficiency. She has symptoms of encephalopathy that come and go and is taking lactulose. Sometimes she has as many as 10 liquid stools a day.  She complains of weakness and fatigue but denies significant palpitations, syncope, chest pain or exertional dyspnea. Chest not had lower extremity edema but often has abdominal distention  She had upper and lower endoscopy last week. She had minimal portal gastropathy and no esophageal varices  Allergies  Allergen Reactions  . Penicillins Anaphylaxis, Itching and Swelling  . Aspirin     Has liver disease  . Midazolam Hcl Other (See Comments)    Daughter has allergy, doctors advised to warn against using.Marland KitchenMarland KitchenPropofol is okay to use.  . Niacin And Related Itching    Feels like skin is on fire    Current Outpatient Prescriptions  Medication Sig Dispense Refill  . albuterol (PROVENTIL HFA;VENTOLIN HFA) 108 (90 BASE) MCG/ACT inhaler Inhale 2 puffs into the lungs every 6 (six) hours as needed for wheezing or shortness of breath.      [name] buPROPion (WELLBUTRIN) 75 MG tablet Take 75 mg by mouth daily.      . diazepam (VALIUM) 5 MG tablet Take 5 mg by mouth every 6 (six) hours as needed for anxiety.       . fentaNYL  (DURAGESIC - DOSED MCG/HR) 75 MCG/HR Place 1 patch onto the skin every 3 (three) days as needed.       . furosemide (LASIX) 20 MG tablet Take 20 mg by mouth daily.       [name] HYDROcodone-acetaminophen (NORCO) 10-325 MG per tablet Take 1-2 tablets by mouth every 6 (six) hours as needed for moderate pain.      [name] lactulose (CHRONULAC) 10 GM/15ML solution Take 45 g by mouth daily as needed for mild constipation.       . promethazine (PHENERGAN) 12.5 MG tablet Take 12.5 mg by mouth every 6 (six) hours as needed for nausea.       [name] rOPINIRole (REQUIP) 1 MG tablet Take 1-2 mg by mouth at bedtime.       [name] spironolactone (ALDACTONE) 100 MG tablet Take 100 mg by mouth daily.      . traZODone (DESYREL) 50 MG tablet Take 50 mg by mouth at bedtime.       No current facility-administered medications for this visit.    Past Medical History  Diagnosis Date  . Hypertension   . Restless leg   . Heart murmur   . Shortness of breath     with exertion   . Sleep apnea 2013    sleep study at [facility]    uses cpap  . Diabetes mellitus     no meds  . 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  platelets  low... wbc also low per pt .[name]   [name]  Blood dyscrasia     low platelets ... low wbc.  ? liver  disease.   . H/O hiatal hernia   . Headache(784.0)   . Anxiety   . Depression   . Chronic kidney disease 50's    nephritis--none at present *12/03/2011)  . Cancer 2005    left breast  . Platelets decreased     ??   D/T CIRRHOISIS  . Family history of anesthesia complication     Daughter has complications with Versed  . PSVT (paroxysmal supraventricular tachycardia)   . Portal hypertension   . Complication of anesthesia     had difficulty going to sleep, staying asleep during surgery and was combative after procedure.  . Asthma     Past Surgical History  Procedure Laterality Date  . Cholecystectomy    . Appendectomy    . Tonsillectomy    . Abdominal hysterectomy    . Cardiac catheterization  1975    normal cath   -low potassium  . Orif humerus fracture  06/19/2011    Procedure: OPEN REDUCTION INTERNAL FIXATION (ORIF) DISTAL HUMERUS FRACTURE;  Surgeon: [name], MD;  Location: [place];  Service: Orthopedics;  Laterality: Left;  . Breast surgery      left lumpectomy--few nodes removed  . Mastectomy    . Orif humerus fracture  12/11/2011    Procedure: OPEN REDUCTION INTERNAL FIXATION (ORIF) HUMERAL SHAFT FRACTURE;  Surgeon: [name], MD;  Location: [place];  Service: Orthopedics;  Laterality: Left;   Removal of Hardware left humerus, Open Reduction Internal Fixation Left Humeral Shaft Fracture Non-Union  . US echocardiography  03/16/2011    mild concentric LVH,mod LAE,stage I diastolic dysfunction,MAC,mild MR,aortic sclerosis,mild TR  . Nm myocar perf wall motion  08/17/2008    mild to mod. superimposed ischemiamid antereoseptal,apical anterior & apical regions  . Colonoscopy w/ biopsies and polypectomy    . Esophagogastroduodenoscopy (egd) with propofol N/A 10/21/2013    Procedure: ESOPHAGOGASTRODUODENOSCOPY (EGD) WITH PROPOFOL;  Surgeon: [name], MD;  Location[facility];  Service: Endoscopy;  Laterality: N/A;  . Colonoscopy with propofol N/A 10/21/2013    Procedure: COLONOSCOPY WITH PROPOFOL;  Surgeon: [name], MD;  Location: [facility] ENDOSCOPY;  Service: Endoscopy;  Laterality: N/A;  ultra slim scope     Family History  Problem Relation Age of Onset  . Anesthesia problems Daughter   . Stroke Father   . Hyperlipidemia Brother     History   Social History  . Marital Status: Widowed    Spouse Name: N/A    Number of Children: N/A  . Years of Education: N/A   Occupational History  . Not on file.   Social History Main Topics  . Smoking status: Never Smoker   . Smokeless tobacco: Never Used  . Alcohol Use: No  . Drug Use: No  . Sexual Activity: No   Other Topics Concern  . Not on file   Social History Narrative  . No narrative on file    Review of  systems: She has difficulty resting at night but often falls asleep during the day. Fatigue is most prominent complaint The patient specifically denies any chest pain at rest or with exertion, dyspnea at rest or with exertion, orthopnea, paroxysmal nocturnal dyspnea, syncope, palpitations, focal neurological deficits, intermittent claudication, unexplained weight gain, cough, hemoptysis or wheezing.  The patient also denies abdominal pain, nausea, vomiting, dysphagia, diarrhea, constipation, polyuria, polydipsia, dysuria, hematuria, frequency, urgency, abnormal bleeding or bruising, fever, chills,  unexpected weight changes, mood swings, change in skin or hair texture, change in voice quality, auditory or visual problems, allergic reactions or rashes, new musculoskeletal complaints other than usual "aches and pains".   PHYSICAL EXAM BP 116/62  Pulse 67  Resp 16  Ht 5\' 8"  (1.727 m)  Wt 204 lb (92.534 kg)  BMI 31.03 kg/m2 General: Alert, oriented x3, no distress  Head: no evidence of trauma, PERRL, EOMI, no exophtalmos or lid lag, no myxedema, no xanthelasma; normal ears, nose and oropharynx  Neck: normal jugular venous pulsations and no hepatojugular reflux; brisk carotid pulses without delay and no carotid bruits  Chest: clear to auscultation, no signs of consolidation by percussion or palpation, normal fremitus, symmetrical and full respiratory excursions  Cardiovascular: normal position and quality of the apical impulse, regular rhythm, normal first and second heart sounds, no rubs or gallops; there is a grade 2/6 holosystolic murmur at the left lower sternal border, most consistent with tricuspid regurgitation. There is a faint apical holosystolic murmur.  Abdomen: no tenderness or distention, no masses by palpation, no abnormal pulsatility or arterial bruits, normal bowel sounds, no hepatosplenomegaly  Extremities: no clubbing, cyanosis or edema; 2+ radial, ulnar and brachial pulses  bilaterally; 2+ right femoral, posterior tibial and dorsalis pedis pulses; 2+ left femoral, posterior tibial and dorsalis pedis pulses; no subclavian or femoral bruits  Neurological: grossly nonfocal   EKG: Normal sinus rhythm, delayed R-wave progression, no repolarization abnormalities  Lipid Panel  No results found for this basename: chol, trig, hdl, cholhdl, vldl, ldlcalc    BMET    Component Value Date/Time   NA 142 07/09/2013 0825   K 3.9 07/09/2013 0825   CL 108 07/09/2013 0825   CO2 25 07/09/2013 0825   GLUCOSE 106* 07/09/2013 0825   BUN 12 07/09/2013 0825   CREATININE 0.66 07/09/2013 0825   CREATININE 0.66 01/07/2013 1427   CALCIUM 8.3* 07/09/2013 0825   GFRNONAA >90 07/09/2013 0825   GFRAA >90 07/09/2013 0825     ASSESSMENT AND PLAN  Mrs. Coleman's palpitations have not troubled her much recently. Her blood pressure is normal. She was unable to tolerate propranolol. Thankfully she does not have a lot of portal hypertension stigmata, treatment with a beta blocker is not as important.  Holli Humbles, MD, [place] [phone] office [phone] pager

## 2013-12-18 ENCOUNTER — Other Ambulatory Visit: Payer: Self-pay

## 2014-04-02 ENCOUNTER — Other Ambulatory Visit: Payer: Self-pay | Admitting: Gastroenterology

## 2014-04-02 DIAGNOSIS — K7469 Other cirrhosis of liver: Secondary | ICD-10-CM

## 2014-04-09 ENCOUNTER — Ambulatory Visit
Admission: RE | Admit: 2014-04-09 | Discharge: 2014-04-09 | Disposition: A | Discharge status: Home / Self Care | Payer: Medicare Other | Source: Ambulatory Visit | Attending: Gastroenterology | Admitting: Gastroenterology

## 2014-04-09 DIAGNOSIS — K7469 Other cirrhosis of liver: Secondary | ICD-10-CM

## 2014-04-17 IMAGING — CT CT HUMERUS*L* W/O CM
4 series · 16 of 35 positions shown, 19 images · non-contrast
Comparison: Shoulder radiographs 06/19/2011.

CLINICAL DATA: Right arm pain.  Evaluate humeral shaft fracture.

CT OF THE LEFT HUMERUS WITHOUT CONTRAST
TECHNIQUE: Multidetector CT imaging was performed according to the
standard protocol. Multiplanar CT image reconstructions were also
generated.

[Series 3: left humerus bone · axial · 0.40mm/px · z∈[-263,-93]mm · 5 of 104 slices shown, 7 images]
[im 18/104  soft-tissue]
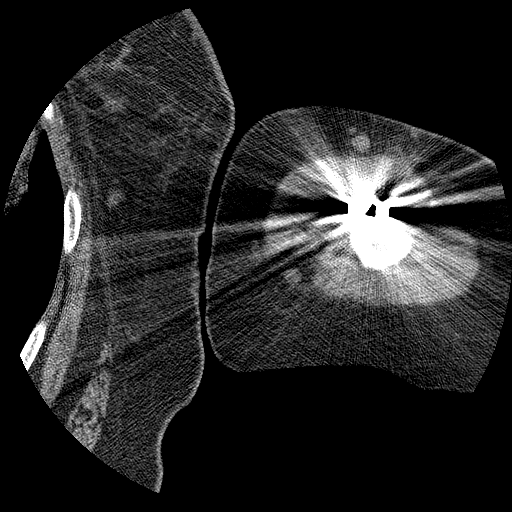
[im 18/104  bone]
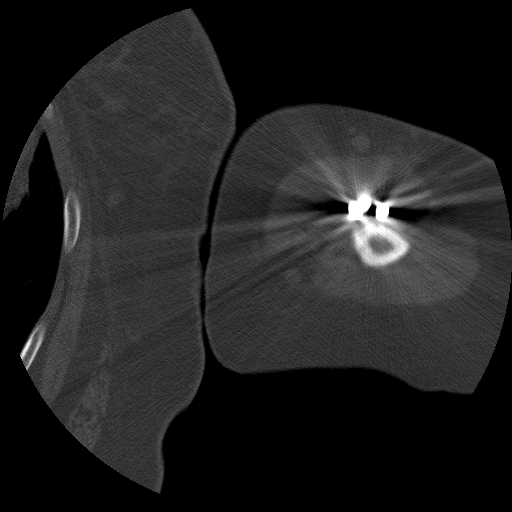
[im 35/104  bone]
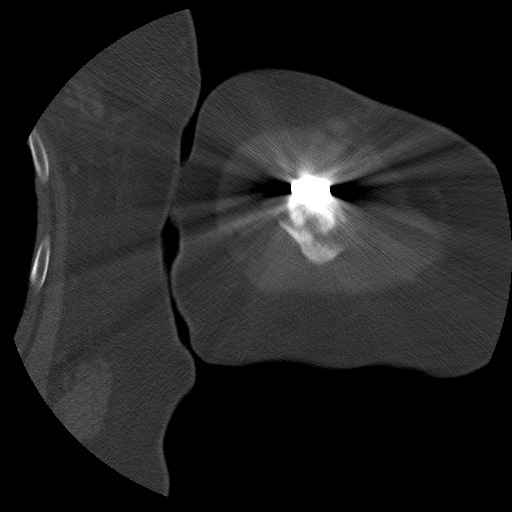
[im 52/104  bone]
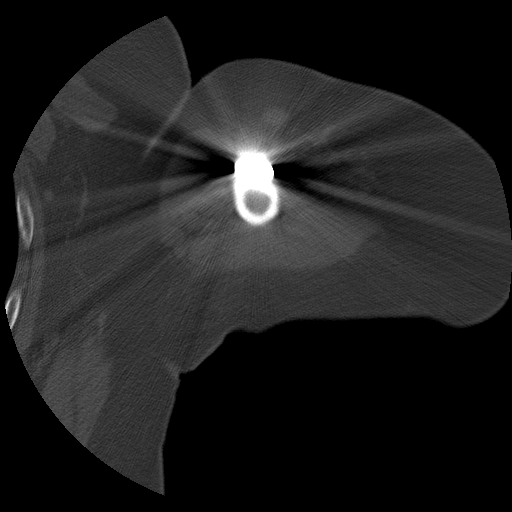
[im 69/104  bone]
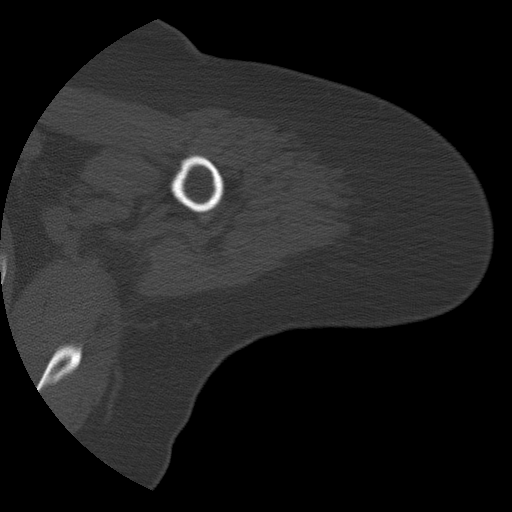
[im 86/104  soft-tissue]
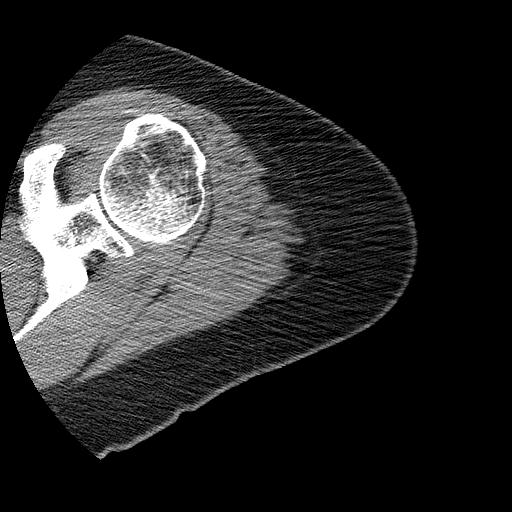
[im 86/104  bone]
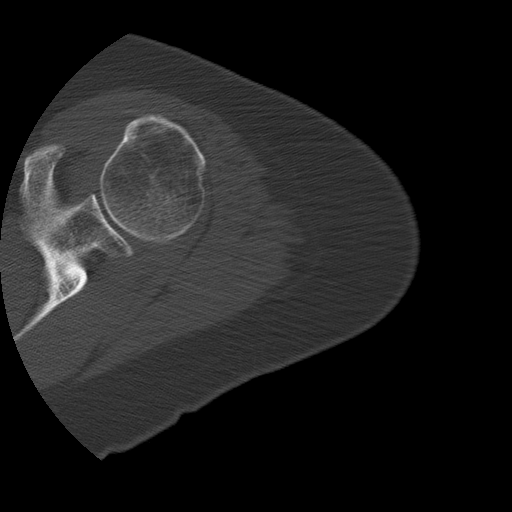

[Series 4: left humerus detail · axial · 0.40mm/px · z∈[-263,-178]mm · 3 of 104 slices shown]
[im 18/104  bone]
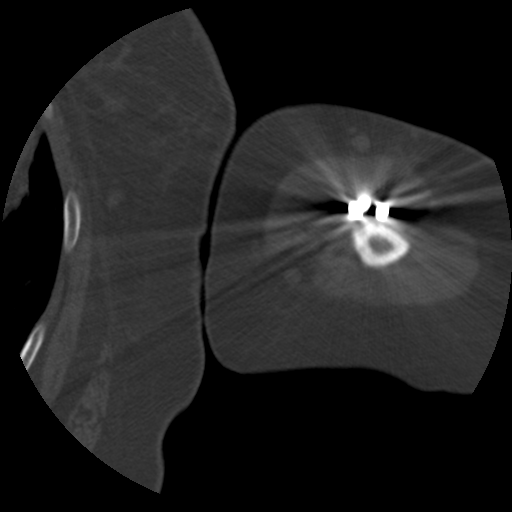
[im 35/104  bone]
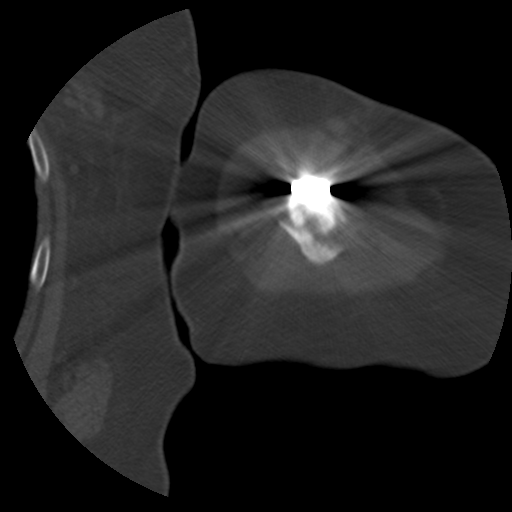
[im 52/104  bone]
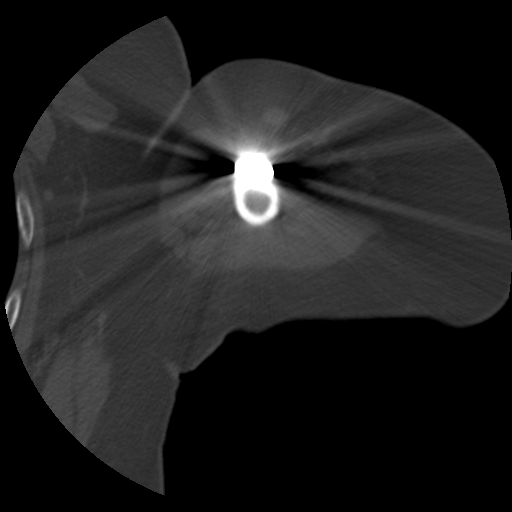

[Series 202: cor left humerus · sagittal · 0.52mm/px · 5 of 52 slices shown, 6 images]
[im 18/52  bone]
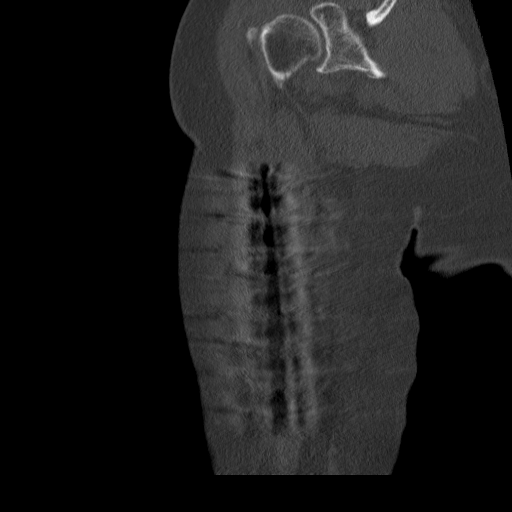
[im 22/52  bone]
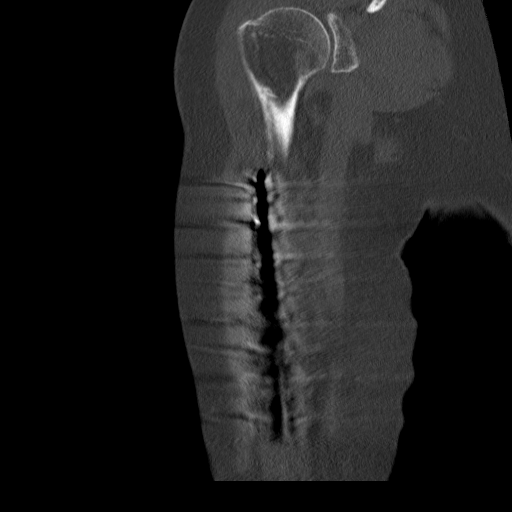
[im 26/52  soft-tissue]
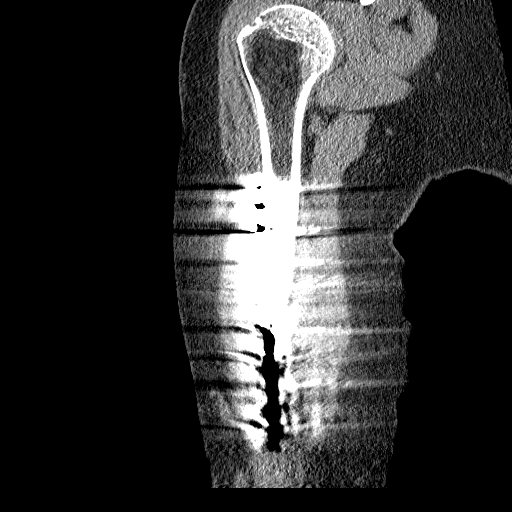
[im 26/52  bone]
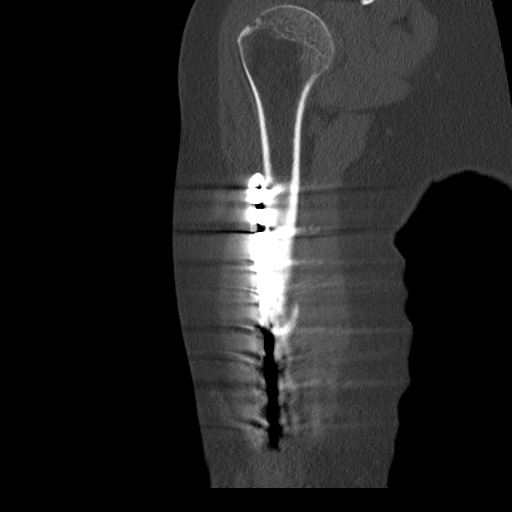
[im 30/52  bone]
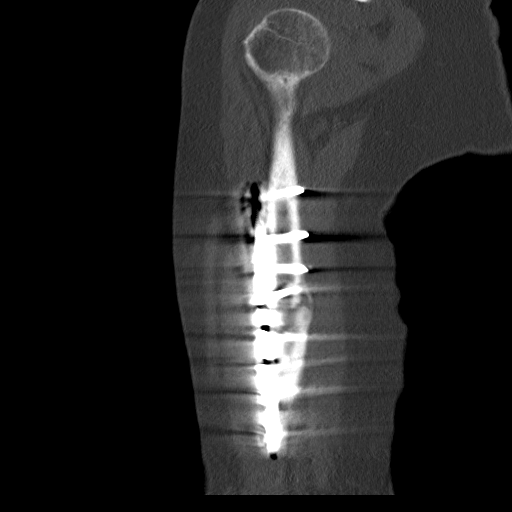
[im 35/52  bone]
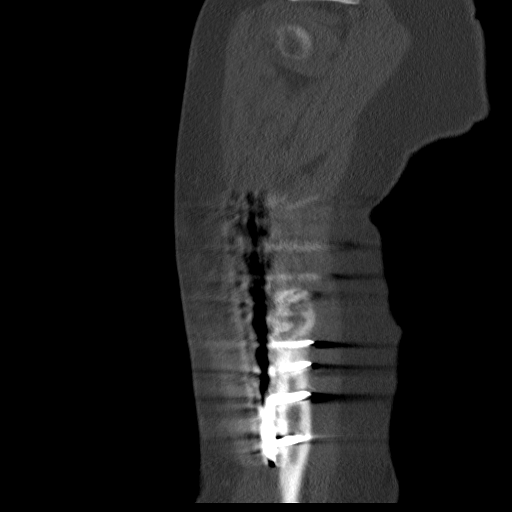

[Series 203: sag left humerus · coronal · 0.52mm/px · 3 of 52 slices shown]
[im 11/52  bone]
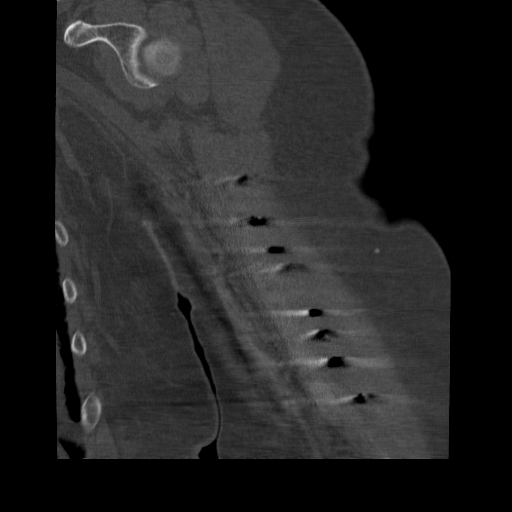
[im 21/52  bone]
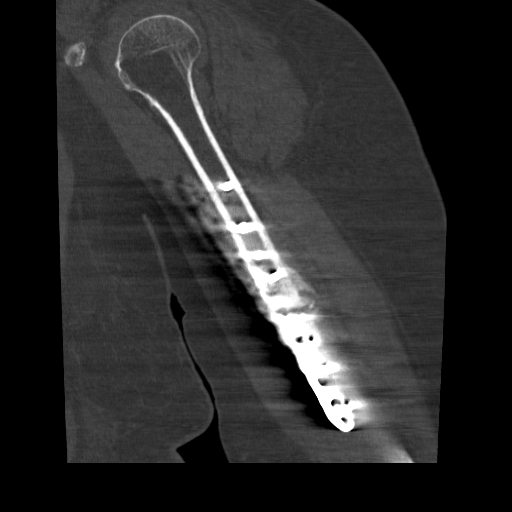
[im 31/52  bone]
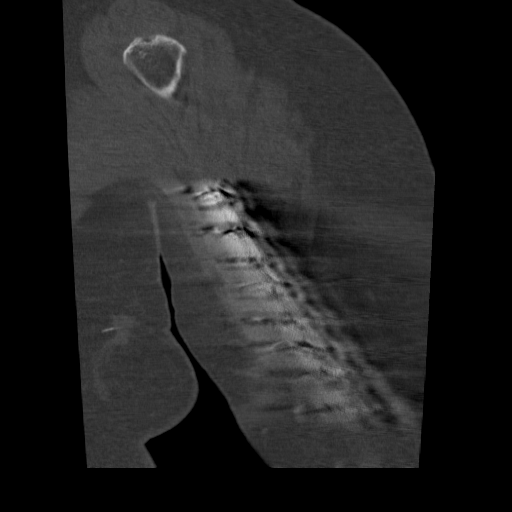

[16 of 35 positions shown; findings below may reference images not displayed]

FINDINGS: There is a long plate and multiple screws on the the
humeral shaft.  The midshaft humeral fracture is not united.  The
plate and screws are intact.  No complicating features.  There is
peripheral callus formation and heterotopic ossification but no
substantial osseous bridging across the fracture.

The shoulder joint is maintained.
IMPRESSION: Nonunion at the mid humeral shaft fracture site.

## 2014-06-29 ENCOUNTER — Encounter (HOSPITAL_BASED_OUTPATIENT_CLINIC_OR_DEPARTMENT_OTHER): Payer: Self-pay | Admitting: Emergency Medicine

## 2014-06-29 ENCOUNTER — Emergency Department (HOSPITAL_BASED_OUTPATIENT_CLINIC_OR_DEPARTMENT_OTHER): Payer: Medicare Other

## 2014-06-29 ENCOUNTER — Emergency Department (HOSPITAL_BASED_OUTPATIENT_CLINIC_OR_DEPARTMENT_OTHER)
Admission: EM | Admit: 2014-06-29 | Discharge: 2014-06-29 | Disposition: A | Discharge status: Home / Self Care | Payer: Medicare Other | Attending: Emergency Medicine | Admitting: Emergency Medicine

## 2014-06-29 DIAGNOSIS — W1839XA Other fall on same level, initial encounter: Secondary | ICD-10-CM | POA: Diagnosis not present

## 2014-06-29 DIAGNOSIS — Y92008 Other place in unspecified non-institutional (private) residence as the place of occurrence of the external cause: Secondary | ICD-10-CM | POA: Diagnosis not present

## 2014-06-29 DIAGNOSIS — F329 Major depressive disorder, single episode, unspecified: Secondary | ICD-10-CM | POA: Insufficient documentation

## 2014-06-29 DIAGNOSIS — F419 Anxiety disorder, unspecified: Secondary | ICD-10-CM | POA: Insufficient documentation

## 2014-06-29 DIAGNOSIS — Y998 Other external cause status: Secondary | ICD-10-CM | POA: Insufficient documentation

## 2014-06-29 DIAGNOSIS — Z79899 Other long term (current) drug therapy: Secondary | ICD-10-CM | POA: Insufficient documentation

## 2014-06-29 DIAGNOSIS — W19XXXA Unspecified fall, initial encounter: Secondary | ICD-10-CM

## 2014-06-29 DIAGNOSIS — E876 Hypokalemia: Secondary | ICD-10-CM | POA: Diagnosis not present

## 2014-06-29 DIAGNOSIS — G2581 Restless legs syndrome: Secondary | ICD-10-CM | POA: Insufficient documentation

## 2014-06-29 DIAGNOSIS — G4730 Sleep apnea, unspecified: Secondary | ICD-10-CM | POA: Diagnosis not present

## 2014-06-29 DIAGNOSIS — E119 Type 2 diabetes mellitus without complications: Secondary | ICD-10-CM | POA: Insufficient documentation

## 2014-06-29 DIAGNOSIS — S6991XA Unspecified injury of right wrist, hand and finger(s), initial encounter: Secondary | ICD-10-CM | POA: Diagnosis present

## 2014-06-29 DIAGNOSIS — Z880 Allergy status to penicillin: Secondary | ICD-10-CM | POA: Diagnosis not present

## 2014-06-29 DIAGNOSIS — Z8719 Personal history of other diseases of the digestive system: Secondary | ICD-10-CM | POA: Diagnosis not present

## 2014-06-29 DIAGNOSIS — Y92009 Unspecified place in unspecified non-institutional (private) residence as the place of occurrence of the external cause: Secondary | ICD-10-CM

## 2014-06-29 DIAGNOSIS — R011 Cardiac murmur, unspecified: Secondary | ICD-10-CM | POA: Insufficient documentation

## 2014-06-29 DIAGNOSIS — Z853 Personal history of malignant neoplasm of breast: Secondary | ICD-10-CM | POA: Insufficient documentation

## 2014-06-29 DIAGNOSIS — S63615A Unspecified sprain of left ring finger, initial encounter: Secondary | ICD-10-CM | POA: Diagnosis not present

## 2014-06-29 DIAGNOSIS — Y9389 Activity, other specified: Secondary | ICD-10-CM | POA: Insufficient documentation

## 2014-06-29 DIAGNOSIS — Z9889 Other specified postprocedural states: Secondary | ICD-10-CM | POA: Diagnosis not present

## 2014-06-29 DIAGNOSIS — Z9981 Dependence on supplemental oxygen: Secondary | ICD-10-CM | POA: Insufficient documentation

## 2014-06-29 DIAGNOSIS — I129 Hypertensive chronic kidney disease with stage 1 through stage 4 chronic kidney disease, or unspecified chronic kidney disease: Secondary | ICD-10-CM | POA: Insufficient documentation

## 2014-06-29 DIAGNOSIS — N189 Chronic kidney disease, unspecified: Secondary | ICD-10-CM | POA: Insufficient documentation

## 2014-06-29 LAB — CBC WITH DIFFERENTIAL/PLATELET
BASOS ABS: 0 10*3/uL (ref 0.0–0.1)
Basophils Relative: 0 % (ref 0–1)
EOS PCT: 0 % (ref 0–5)
Eosinophils Absolute: 0 10*3/uL (ref 0.0–0.7)
HCT: 33 % — ABNORMAL LOW (ref 36.0–46.0)
HEMOGLOBIN: 11.3 g/dL — AB (ref 12.0–15.0)
Lymphocytes Relative: 37 % (ref 12–46)
Lymphs Abs: 0.7 10*3/uL (ref 0.7–4.0)
MCH: 31.4 pg (ref 26.0–34.0)
MCHC: 34.2 g/dL (ref 30.0–36.0)
MCV: 91.7 fL (ref 78.0–100.0)
MONO ABS: 0.2 10*3/uL (ref 0.1–1.0)
Monocytes Relative: 11 % (ref 3–12)
Neutro Abs: 1 10*3/uL — ABNORMAL LOW (ref 1.7–7.7)
Neutrophils Relative %: 52 % (ref 43–77)
PLATELETS: 38 10*3/uL — AB (ref 150–400)
RBC: 3.6 MIL/uL — AB (ref 3.87–5.11)
RDW: 16.4 % — ABNORMAL HIGH (ref 11.5–15.5)
WBC: 2 10*3/uL — AB (ref 4.0–10.5)

## 2014-06-29 LAB — COMPREHENSIVE METABOLIC PANEL
ALK PHOS: 158 U/L — AB (ref 39–117)
ALT: 21 U/L (ref 0–35)
AST: 33 U/L (ref 0–37)
Albumin: 3.1 g/dL — ABNORMAL LOW (ref 3.5–5.2)
Anion gap: 6 (ref 5–15)
BILIRUBIN TOTAL: 2.5 mg/dL — AB (ref 0.3–1.2)
BUN: 12 mg/dL (ref 6–23)
CALCIUM: 8.8 mg/dL (ref 8.4–10.5)
CHLORIDE: 108 mmol/L (ref 96–112)
CO2: 28 mmol/L (ref 19–32)
Creatinine, Ser: 0.63 mg/dL (ref 0.50–1.10)
GLUCOSE: 85 mg/dL (ref 70–99)
POTASSIUM: 2.9 mmol/L — AB (ref 3.5–5.1)
Sodium: 142 mmol/L (ref 135–145)
TOTAL PROTEIN: 6.1 g/dL (ref 6.0–8.3)

## 2014-06-29 LAB — TROPONIN I: Troponin I: <0.03 ng/mL (ref <0.031)

## 2014-06-29 LAB — AMMONIA: Ammonia: 57 umol/L — ABNORMAL HIGH (ref 11–32)

## 2014-06-29 MED ORDER — POTASSIUM CHLORIDE 10 MEQ/100ML IV SOLN
10.0000 meq | Freq: Once | INTRAVENOUS | Status: AC
  Administered 2014-06-29: 10 meq via INTRAVENOUS
  Filled 2014-06-29: qty 100

## 2014-06-29 MED ORDER — FENTANYL CITRATE (PF) 100 MCG/2ML IJ SOLN
50.0000 ug | Freq: Once | INTRAMUSCULAR | Status: AC
  Administered 2014-06-29: 50 ug via INTRAVENOUS
  Filled 2014-06-29: qty 2

## 2014-06-29 MED ORDER — POTASSIUM CHLORIDE CRYS ER 20 MEQ PO TBCR: 20.0000 meq | EXTENDED_RELEASE_TABLET | Freq: Two times a day (BID) | ORAL | Status: DC

## 2014-06-29 MED ORDER — OXYCODONE HCL 5 MG PO TABS: 5.0000 mg | ORAL_TABLET | ORAL | Status: DC | PRN

## 2014-06-29 MED ORDER — POTASSIUM CHLORIDE CRYS ER 20 MEQ PO TBCR
40.0000 meq | EXTENDED_RELEASE_TABLET | Freq: Once | ORAL | Status: AC
  Administered 2014-06-29: 40 meq via ORAL
  Filled 2014-06-29: qty 2

## 2014-06-29 NOTE — ED Notes (Signed)
Patient states that she went to bed last night and felt really sick last night at 2300. Patient reports that she woke up this am at 0900 and states that she has a feeling like she is falling backwards all the time. She states that she actually fell and hurt finger at noon. Patient states that she has had some confusion today as well

## 2014-06-29 NOTE — ED Provider Notes (Addendum)
CSN: [id number]     Arrival date & time 06/29/14  0123 History   First MD Initiated Contact with Patient 06/29/14 0141     Chief Complaint  Patient presents with  . Fall     (Consider location/radiation/quality/duration/timing/severity/associated sxs/prior Treatment) HPI  This is a 69 year old female with a history of nonalcoholic cirrhosis, chronic leukopenia and thrombocytopenia. She has had about a six-month history of episodic weakness in which she falls backwards. She attributes this to being off balance and it is not associated with vertigo or lightheadedness. She felt backwards yesterday injuring her left ring finger. There is now moderate pain, swelling and ecchymosis of that finger; pain is worse with palpation or attempted movement. These episodes of falling are usually brief and resolve after a day or 2. She denies chest pain. She has had shortness of breath, worse with exertion that has been present for a couple of months. She has had this evaluated by her PCP and has not acutely changed. She did have one brief episode of vertigo and feeling disoriented that caused her to have to take a rest. This resolved on its own.  Past Medical History  Diagnosis Date  . Hypertension   . Restless leg   . Heart murmur   . Shortness of breath     with exertion   . Sleep apnea 2013    sleep study at [facility]    uses cpap  . Diabetes mellitus     no meds  . 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  platelets  low... wbc also low per pt ..   . Blood dyscrasia     low platelets ... low wbc.  ? liver  disease.   . H/O hiatal hernia   . Headache(784.0)   . Anxiety   . Depression   . Chronic kidney disease 50's    nephritis--none at present *12/03/2011)  . Cancer 2005    left breast  . Platelets decreased     ??   D/T CIRRHOISIS  . Family history of anesthesia complication     Daughter has complications with Versed  . PSVT (paroxysmal supraventricular tachycardia)   . Portal hypertension   .  Complication of anesthesia     had difficulty going to sleep, staying asleep during surgery and was combative after procedure.  . Asthma    Past Surgical History  Procedure Laterality Date  . Cholecystectomy    . Appendectomy    . Tonsillectomy    . Abdominal hysterectomy    . Cardiac catheterization  1975    normal cath  -low potassium  . Orif humerus fracture  06/19/2011    Procedure: OPEN REDUCTION INTERNAL FIXATION (ORIF) DISTAL HUMERUS FRACTURE;  Surgeon: [name], MD;  Location: [place];  Service: Orthopedics;  Laterality: Left;  . Breast surgery      left lumpectomy--few nodes removed  . Mastectomy    . Orif humerus fracture  12/11/2011    Procedure: OPEN REDUCTION INTERNAL FIXATION (ORIF) HUMERAL SHAFT FRACTURE;  Surgeon: [name], MD;  Location: [place];  Service: Orthopedics;  Laterality: Left;   Removal of Hardware left humerus, Open Reduction Internal Fixation Left Humeral Shaft Fracture Non-Union  . US echocardiography  03/16/2011    mild concentric LVH,mod LAE,stage I diastolic dysfunction,MAC,mild MR,aortic sclerosis,mild TR  . Nm myocar perf wall motion  08/17/2008    mild to mod. superimposed ischemiamid antereoseptal,apical anterior & apical regions  . Colonoscopy w/ biopsies and  polypectomy    . Esophagogastroduodenoscopy (egd) with propofol N/A 10/21/2013    Procedure: ESOPHAGOGASTRODUODENOSCOPY (EGD) WITH PROPOFOL;  Surgeon: [name], MD;  Location: [facility] ENDOSCOPY;  Service: Endoscopy;  Laterality: N/A;  . Colonoscopy with propofol N/A 10/21/2013    Procedure: COLONOSCOPY WITH PROPOFOL;  Surgeon: [name], MD;  Location: [facility] ENDOSCOPY;  Service: Endoscopy;  Laterality: N/A;  ultra slim scope    Family History  Problem Relation Age of Onset  . Anesthesia problems Daughter   . Stroke Father   . Hyperlipidemia Brother    History  Substance Use Topics  . Smoking status: Never Smoker   . Smokeless tobacco: Never Used  . Alcohol Use: No     OB History    No data available     Review of Systems  All other systems reviewed and are negative.   Allergies  Penicillins; Aspirin; Midazolam hcl; and Niacin and related  Home Medications   Prior to Admission medications   Medication Sig Start Date End Date Taking? Authorizing Provider  albuterol (PROVENTIL HFA;VENTOLIN HFA) 108 (90 BASE) MCG/ACT inhaler Inhale 2 puffs into the lungs every 6 (six) hours as needed for wheezing or shortness of breath.    Historical Provider, MD  buPROPion (WELLBUTRIN) 75 MG tablet Take 75 mg by mouth daily.    Historical Provider, MD  diazepam (VALIUM) 5 MG tablet Take 5 mg by mouth every 6 (six) hours as needed for anxiety.     Historical Provider, MD  fentaNYL (DURAGESIC - DOSED MCG/HR) 75 MCG/HR Place 1 patch onto the skin every 3 (three) days as needed.     Historical Provider, MD  furosemide (LASIX) 20 MG tablet Take 20 mg by mouth daily.  06/14/13   Veryl Speak, MD  HYDROcodone-acetaminophen (NORCO) 10-325 MG per tablet Take 1-2 tablets by mouth every 6 (six) hours as needed for moderate pain.    Historical Provider, MD  lactulose (CHRONULAC) 10 GM/15ML solution Take 45 g by mouth daily as needed for mild constipation.     Historical Provider, MD  promethazine (PHENERGAN) 12.5 MG tablet Take 12.5 mg by mouth every 6 (six) hours as needed for nausea.     Historical Provider, MD  rOPINIRole (REQUIP) 1 MG tablet Take 1-2 mg by mouth at bedtime.     Historical Provider, MD  spironolactone (ALDACTONE) 100 MG tablet Take 100 mg by mouth daily. 06/30/13   Historical Provider, MD  traZODone (DESYREL) 50 MG tablet Take 50 mg by mouth at bedtime.    Historical Provider, MD   BP 158/64 mmHg  Pulse 79  Temp(Src) 98.3 F (36.8 C) (Oral)  Resp 18  Ht 5\' 8"  (1.727 m)  Wt 215 lb (97.523 kg)  BMI 32.70 kg/m2  SpO2 100%   Physical Exam  General: Well-developed, well-nourished female in no acute distress; appearance consistent with age of record HENT:  normocephalic; atraumatic Eyes: pupils equal, round and reactive to light; extraocular muscles intact Neck: supple Heart: regular rate and rhythm; 4/6 holosystolic murmur with radiation to the right carotid  Lungs: clear to auscultation bilaterally Abdomen: soft; nondistended; nontender; splenomegaly; bowel sounds present Extremities: No deformity; full range of motion; pulses normal; trace edema of lower legs; ecchymosis, edema and tenderness of left ring finger:      Neurologic: Awake, alert and oriented; motor function intact in all extremities and symmetric; no facial droop; normal coordination and speech; no asterixis; negative Romberg; normal finger to nose  Skin: Warm and dry Psychiatric:  Normal mood and affect    ED Course  Procedures (including critical care time)   MDM  Nursing notes and vitals signs, including pulse oximetry, reviewed.  Summary of this visit's results, reviewed by myself:  Labs:  Results for orders placed or performed during the hospital encounter of 06/29/14 (from the past 24 hour(s))  Troponin I (MHP)     Status: None   Collection Time: 06/29/14  2:25 AM  Result Value Ref Range   Troponin I <0.03 <0.031 ng/mL  CBC with Differential/Platelet     Status: Abnormal   Collection Time: 06/29/14  2:25 AM  Result Value Ref Range   WBC 2.0 (L) 4.0 - 10.5 K/uL   RBC 3.60 (L) 3.87 - 5.11 MIL/uL   Hemoglobin 11.3 (L) 12.0 - 15.0 g/dL   HCT 33.0 (L) 36.0 - 46.0 %   MCV 91.7 78.0 - 100.0 fL   MCH 31.4 26.0 - 34.0 pg   MCHC 34.2 30.0 - 36.0 g/dL   RDW 16.4 (H) 11.5 - 15.5 %   Platelets 38 (L) 150 - 400 K/uL   Neutrophils Relative % 52 43 - 77 %   Neutro Abs 1.0 (L) 1.7 - 7.7 K/uL   Lymphocytes Relative 37 12 - 46 %   Lymphs Abs 0.7 0.7 - 4.0 K/uL   Monocytes Relative 11 3 - 12 %   Monocytes Absolute 0.2 0.1 - 1.0 K/uL   Eosinophils Relative 0 0 - 5 %   Eosinophils Absolute 0.0 0.0 - 0.7 K/uL   Basophils Relative 0 0 - 1 %   Basophils Absolute 0.0  0.0 - 0.1 K/uL   Smear Review WHITE COUNT CONFIRMED ON SMEAR   Comprehensive metabolic panel     Status: Abnormal   Collection Time: 06/29/14  2:25 AM  Result Value Ref Range   Sodium 142 135 - 145 mmol/L   Potassium 2.9 (L) 3.5 - 5.1 mmol/L   Chloride 108 96 - 112 mmol/L   CO2 28 19 - 32 mmol/L   Glucose, Bld 85 70 - 99 mg/dL   BUN 12 6 - 23 mg/dL   Creatinine, Ser 0.63 0.50 - 1.10 mg/dL   Calcium 8.8 8.4 - 10.5 mg/dL   Total Protein 6.1 6.0 - 8.3 g/dL   Albumin 3.1 (L) 3.5 - 5.2 g/dL   AST 33 0 - 37 U/L   ALT 21 0 - 35 U/L   Alkaline Phosphatase 158 (H) 39 - 117 U/L   Total Bilirubin 2.5 (H) 0.3 - 1.2 mg/dL   GFR calc non Af Amer >90 >90 mL/min   GFR calc Af Amer >90 >90 mL/min   Anion gap 6 5 - 15  Ammonia     Status: Abnormal   Collection Time: 06/29/14  2:25 AM  Result Value Ref Range   Ammonia 57 (H) 11 - 32 umol/L    Imaging Studies: Dg Hand Complete Left  06/29/2014   CLINICAL DATA:  69 year old female with left hand pain after falling yesterday  EXAM: LEFT HAND - COMPLETE 3+ VIEW  COMPARISON:  None  FINDINGS: The bones appear osteopenic. Soft tissue swelling is present over the dorsal aspect of the metacarpal heads. No evidence of underlying fracture or malalignment. Remote healed ulnar styloid fracture. Mild degenerative osteoarthritis in the distal interphalangeal joints.  IMPRESSION: Soft tissue swelling without evidence of acute fracture or malalignment.   Electronically Signed   By: Jacqulynn Cadet M.D.   On: 06/29/2014 02:55   4:45 AM  Ambulates without difficulty or dizziness. She was given potassium IV and by mouth. Her sprained finger was splinted. We will have her follow-up with her PCP. She is not clinically encephalopathic at this time. There does not appear to be an indication for admission at this time. Her leukopenia and thrombocytopenia are consistent with previous values over the past 2 years.     Shanon Rosser, MD 06/29/14 [id number]  Shanon Rosser,  MD 06/29/14 [id number]

## 2014-07-01 ENCOUNTER — Encounter (HOSPITAL_BASED_OUTPATIENT_CLINIC_OR_DEPARTMENT_OTHER): Payer: Self-pay | Admitting: *Deleted

## 2014-07-01 ENCOUNTER — Emergency Department (HOSPITAL_BASED_OUTPATIENT_CLINIC_OR_DEPARTMENT_OTHER)
Admission: EM | Admit: 2014-07-01 | Discharge: 2014-07-02 | Disposition: A | Discharge status: Home / Self Care | Payer: Medicare Other | Attending: Emergency Medicine | Admitting: Emergency Medicine

## 2014-07-01 DIAGNOSIS — F329 Major depressive disorder, single episode, unspecified: Secondary | ICD-10-CM | POA: Insufficient documentation

## 2014-07-01 DIAGNOSIS — Z853 Personal history of malignant neoplasm of breast: Secondary | ICD-10-CM | POA: Diagnosis not present

## 2014-07-01 DIAGNOSIS — E119 Type 2 diabetes mellitus without complications: Secondary | ICD-10-CM | POA: Insufficient documentation

## 2014-07-01 DIAGNOSIS — W1839XD Other fall on same level, subsequent encounter: Secondary | ICD-10-CM | POA: Insufficient documentation

## 2014-07-01 DIAGNOSIS — G4730 Sleep apnea, unspecified: Secondary | ICD-10-CM | POA: Diagnosis not present

## 2014-07-01 DIAGNOSIS — Z9889 Other specified postprocedural states: Secondary | ICD-10-CM | POA: Diagnosis not present

## 2014-07-01 DIAGNOSIS — L0390 Cellulitis, unspecified: Secondary | ICD-10-CM

## 2014-07-01 DIAGNOSIS — N189 Chronic kidney disease, unspecified: Secondary | ICD-10-CM | POA: Diagnosis not present

## 2014-07-01 DIAGNOSIS — Z9981 Dependence on supplemental oxygen: Secondary | ICD-10-CM | POA: Insufficient documentation

## 2014-07-01 DIAGNOSIS — R509 Fever, unspecified: Secondary | ICD-10-CM | POA: Diagnosis not present

## 2014-07-01 DIAGNOSIS — R011 Cardiac murmur, unspecified: Secondary | ICD-10-CM | POA: Diagnosis not present

## 2014-07-01 DIAGNOSIS — G2581 Restless legs syndrome: Secondary | ICD-10-CM | POA: Diagnosis not present

## 2014-07-01 DIAGNOSIS — S6992XD Unspecified injury of left wrist, hand and finger(s), subsequent encounter: Secondary | ICD-10-CM | POA: Diagnosis present

## 2014-07-01 DIAGNOSIS — F419 Anxiety disorder, unspecified: Secondary | ICD-10-CM | POA: Insufficient documentation

## 2014-07-01 DIAGNOSIS — Z880 Allergy status to penicillin: Secondary | ICD-10-CM | POA: Diagnosis not present

## 2014-07-01 DIAGNOSIS — I129 Hypertensive chronic kidney disease with stage 1 through stage 4 chronic kidney disease, or unspecified chronic kidney disease: Secondary | ICD-10-CM | POA: Insufficient documentation

## 2014-07-01 DIAGNOSIS — J45909 Unspecified asthma, uncomplicated: Secondary | ICD-10-CM | POA: Diagnosis not present

## 2014-07-01 LAB — BASIC METABOLIC PANEL
Anion gap: 5 (ref 5–15)
BUN: 15 mg/dL (ref 6–23)
CHLORIDE: 106 mmol/L (ref 96–112)
CO2: 28 mmol/L (ref 19–32)
Calcium: 8.1 mg/dL — ABNORMAL LOW (ref 8.4–10.5)
Creatinine, Ser: 0.63 mg/dL (ref 0.50–1.10)
Glucose, Bld: 91 mg/dL (ref 70–99)
Potassium: 3.6 mmol/L (ref 3.5–5.1)
Sodium: 139 mmol/L (ref 135–145)

## 2014-07-01 LAB — CBC WITH DIFFERENTIAL/PLATELET
Basophils Absolute: 0 10*3/uL (ref 0.0–0.1)
Basophils Relative: 0 % (ref 0–1)
EOS ABS: 0 10*3/uL (ref 0.0–0.7)
Eosinophils Relative: 0 % (ref 0–5)
HEMATOCRIT: 33.5 % — AB (ref 36.0–46.0)
Hemoglobin: 11.6 g/dL — ABNORMAL LOW (ref 12.0–15.0)
LYMPHS PCT: 28 % (ref 12–46)
Lymphs Abs: 0.9 10*3/uL (ref 0.7–4.0)
MCH: 31.6 pg (ref 26.0–34.0)
MCHC: 34.6 g/dL (ref 30.0–36.0)
MCV: 91.3 fL (ref 78.0–100.0)
Monocytes Absolute: 0.4 10*3/uL (ref 0.1–1.0)
Monocytes Relative: 11 % (ref 3–12)
Neutro Abs: 1.9 10*3/uL (ref 1.7–7.7)
Neutrophils Relative %: 61 % (ref 43–77)
Platelets: 34 10*3/uL — ABNORMAL LOW (ref 150–400)
RBC: 3.67 MIL/uL — ABNORMAL LOW (ref 3.87–5.11)
RDW: 16.5 % — AB (ref 11.5–15.5)
WBC: 3.1 10*3/uL — ABNORMAL LOW (ref 4.0–10.5)

## 2014-07-01 MED ORDER — CLINDAMYCIN PHOSPHATE 600 MG/50ML IV SOLN
INTRAVENOUS | Status: AC
  Administered 2014-07-01: 300 mg
  Filled 2014-07-01: qty 50

## 2014-07-01 MED ORDER — CLINDAMYCIN HCL 150 MG PO CAPS: 150.0000 mg | ORAL_CAPSULE | Freq: Four times a day (QID) | ORAL | Status: DC

## 2014-07-01 MED ORDER — FENTANYL CITRATE (PF) 100 MCG/2ML IJ SOLN
50.0000 ug | Freq: Once | INTRAMUSCULAR | Status: AC
  Administered 2014-07-01: via INTRAVENOUS
  Filled 2014-07-01: qty 2

## 2014-07-01 MED ORDER — CLINDAMYCIN PHOSPHATE 300 MG/50ML IV SOLN
300.0000 mg | Freq: Once | INTRAVENOUS | Status: DC
  Filled 2014-07-01: qty 50

## 2014-07-01 NOTE — Discharge Instructions (Signed)

## 2014-07-01 NOTE — ED Provider Notes (Signed)
CSN: [id number]     Arrival date & time 07/01/14  2139 History  This chart was scribed for Leonard Schwartz, MD by Irene Pap, ED Scribe. This patient was seen in room MH09/MH09 and patient care was started at 10:16 PM.      Chief Complaint  Patient presents with  . Hand Injury   Patient is a 69 y.o. female presenting with hand injury. The history is provided by the patient. No language interpreter was used.  Hand Injury Associated symptoms: fever     HPI Comments: BRUCHA AHLQUIST is a 69 y.o. female who presents to the Emergency Department complaining of left hand injury onset 3 days ago. She states that she fell backwards onto her left hand with no wound or bleeding. She states that she was seen 2 days ago in the ED with the same injury and was told that nothing was broken; that it was just a bad sprain. She states that there is pain, redness, warmth, and swelling to the area. She states that the pain has not gotten better. She reports that she had a fever tmax 100 F PTA. 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 She also reports that her potassium was low at her last visit, for which she was given potassium pills and an IV. She states that she was previously on potassium medications at home, but has since stopped because it had reached an acceptable level. She states that she was given a prescription upon leaving the ED on 06/29/2014. She states that she was also given Oxycontin upon leaving the ED. She reports allergies to penicillin.     Past Medical History  Diagnosis Date  . Hypertension   . Restless leg   . Heart murmur   . Shortness of breath     with exertion   . Sleep apnea 2013    sleep study at [facility]    uses cpap  . Diabetes mellitus     no meds  . 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  platelets  low... wbc also low per pt ..   . Blood dyscrasia     low platelets ... low wbc.  ? liver  disease.   . H/O hiatal hernia   .  Headache(784.0)   . Anxiety   . Depression   . Chronic kidney disease 50's    nephritis--none at present *12/03/2011)  . Cancer 2005    left breast  . Platelets decreased     ??   D/T CIRRHOISIS  . Family history of anesthesia complication     Daughter has complications with Versed  . PSVT (paroxysmal supraventricular tachycardia)   . Portal hypertension   . Complication of anesthesia     had difficulty going to sleep, staying asleep during surgery and was combative after procedure.  . Asthma    Past Surgical History  Procedure Laterality Date  . Cholecystectomy    . Appendectomy    . Tonsillectomy    . Abdominal hysterectomy    . Cardiac catheterization  1975    normal cath  -low potassium  . Orif humerus fracture  06/19/2011    Procedure: OPEN REDUCTION INTERNAL FIXATION (ORIF) DISTAL HUMERUS FRACTURE;  Surgeon: [name], MD;  Location: [place];  Service: Orthopedics;  Laterality: Left;  . Breast surgery      left lumpectomy--few nodes removed  . Mastectomy    . Orif humerus fracture  12/11/2011    Procedure: OPEN REDUCTION INTERNAL FIXATION (ORIF) HUMERAL SHAFT FRACTURE;  Surgeon: [name], MD;  Location: [place];  Service: Orthopedics;  Laterality: Left;   Removal of Hardware left humerus, Open Reduction Internal Fixation Left Humeral Shaft Fracture Non-Union  . US echocardiography  03/16/2011    mild concentric LVH,mod LAE,stage I diastolic dysfunction,MAC,mild MR,aortic sclerosis,mild TR  . Nm myocar perf wall motion  08/17/2008    mild to mod. superimposed ischemiamid antereoseptal,apical anterior & apical regions  . Colonoscopy w/ biopsies and polypectomy    . Esophagogastroduodenoscopy (egd) with propofol N/A 10/21/2013    Procedure: ESOPHAGOGASTRODUODENOSCOPY (EGD) WITH PROPOFOL;  Surgeon: [name], MD;  Location[facility] ENDOSCOPY;  Service: Endoscopy;  Laterality: N/A;  . Colonoscopy with propofol N/A 10/21/2013    Procedure: COLONOSCOPY WITH  PROPOFOL;  Surgeon: [name], MD;  Location: [facility] ENDOSCOPY;  Service: Endoscopy;  Laterality: N/A;  ultra slim scope    Family History  Problem Relation Age of Onset  . Anesthesia problems Daughter   . Stroke Father   . Hyperlipidemia Brother    History  Substance Use Topics  . Smoking status: Never Smoker   . Smokeless tobacco: Never Used  . Alcohol Use: No   OB History    No data available     Review of Systems  Constitutional: Positive for fever.  Musculoskeletal: Positive for arthralgias.  All other systems reviewed and are negative.  Allergies  Penicillins; Aspirin; Midazolam hcl; and Niacin and related  Home Medications   Prior to Admission medications   Medication Sig Start Date End Date Taking? Authorizing Provider  albuterol (PROVENTIL HFA;VENTOLIN HFA) 108 (90 BASE) MCG/ACT inhaler Inhale 2 puffs into the lungs every 6 (six) hours as needed for wheezing or shortness of breath.    Historical Provider, MD  buPROPion (WELLBUTRIN) 75 MG tablet Take 75 mg by mouth daily.    Historical Provider, MD  clindamycin (CLEOCIN) 150 MG capsule Take 1 capsule (150 mg total) by mouth every 6 (six) hours. 07/01/14   Leonard Schwartz, MD  diazepam (VALIUM) 5 MG tablet Take 5 mg by mouth every 6 (six) hours as needed for anxiety.     Historical Provider, MD  fentaNYL (DURAGESIC - DOSED MCG/HR) 75 MCG/HR Place 1 patch onto the skin every 3 (three) days as needed.     Historical Provider, MD  furosemide (LASIX) 20 MG tablet Take 20 mg by mouth daily.  06/14/13   Veryl Speak, MD  HYDROcodone-acetaminophen (NORCO) 10-325 MG per tablet Take 1-2 tablets by mouth every 6 (six) hours as needed for moderate pain.    Historical Provider, MD  lactulose (CHRONULAC) 10 GM/15ML solution Take 45 g by mouth daily as needed for mild constipation.     Historical Provider, MD  oxyCODONE (OXY IR/ROXICODONE) 5 MG immediate release tablet Take 1 tablet (5 mg total) by mouth every 4 (four) hours as needed (for  pain). 06/29/14   John Molpus, MD  potassium chloride SA (K-DUR,KLOR-CON) 20 MEQ tablet Take 1 tablet (20 mEq total) by mouth 2 (two) times daily. 06/29/14   John Molpus, MD  promethazine (PHENERGAN) 12.5 MG tablet Take 12.5 mg by mouth every 6 (six) hours as needed for nausea.     Historical Provider, MD  rOPINIRole (REQUIP) 1 MG tablet Take 1-2 mg by mouth at bedtime.     Historical Provider, MD  spironolactone (ALDACTONE) 100 MG tablet Take 100 mg by mouth daily. 06/30/13   Historical Provider,  MD  traZODone (DESYREL) 50 MG tablet Take 50 mg by mouth at bedtime.    Historical Provider, MD   BP 156/70 mmHg  Pulse 87  Temp(Src) 98.6 F (37 C) (Oral)  Resp 18  Ht 5\' 8"  (1.727 m)  Wt 215 lb (97.523 kg)  BMI 32.70 kg/m2  SpO2 96% Physical Exam  Constitutional: She is oriented to person, place, and time. She appears well-developed and well-nourished. No distress.  HENT:  Head: Normocephalic and atraumatic.  Eyes: Pupils are equal, round, and reactive to light.  Neck: Normal range of motion.  Cardiovascular: Normal rate and intact distal pulses.   Pulmonary/Chest: No respiratory distress.  Abdominal: Normal appearance. She exhibits no distension.  Musculoskeletal:       Right hand: She exhibits decreased range of motion, tenderness and swelling.       Hands: Neurological: She is alert and oriented to person, place, and time. No cranial nerve deficit.  Skin: Skin is warm and dry. No rash noted.  Psychiatric: She has a normal mood and affect. Her behavior is normal.  Nursing note and vitals reviewed.   ED Course  Procedures (including critical care time)  Medications  fentaNYL (SUBLIMAZE) injection 50 mcg ( Intravenous Given 07/01/14 2345)  clindamycin (CLEOCIN) 600 MG/50ML IVPB (  Stopped 07/02/14 0019)    DIAGNOSTIC STUDIES: Oxygen Saturation is 96% on room air, normal by my interpretation.    COORDINATION OF CARE: 10:21 PM-Discussed treatment plan which includes anti-biotics and  pain medication with pt at bedside and pt agreed to plan.   Labs Review Labs Reviewed  BASIC METABOLIC PANEL - Abnormal; Notable for the following:    Calcium 8.1 (*)    All other components within normal limits  CBC WITH DIFFERENTIAL/PLATELET - Abnormal; Notable for the following:    WBC 3.1 (*)    RBC 3.67 (*)    Hemoglobin 11.6 (*)    HCT 33.5 (*)    RDW 16.5 (*)    Platelets 34 (*)    All other components within normal limits    Imaging Review No results found.  No fever here.  No leukocytosis. Potassium back to normal. Will give IV clindamycin and start on PO.  Will encourage PMD f/u tomorrow.  To return here if worse.  MDM   Final diagnoses:  Cellulitis, unspecified cellulitis site, unspecified extremity site, unspecified laterality   I personally performed the services described in this documentation, which was scribed in my presence. The recorded information has been reviewed and considered.   Leonard Schwartz, MD 07/02/14 [phone]

## 2014-07-01 NOTE — ED Notes (Signed)
Pt reports falling on Monday injuring her left hand.  Reports that she was seen and treated for a hand sprain.  Large amounts of swelling, redness, streaking, bruising and warmth noted to pts left hand.

## 2014-08-30 ENCOUNTER — Other Ambulatory Visit: Payer: Self-pay

## 2014-12-31 ENCOUNTER — Telehealth: Payer: Self-pay | Admitting: *Deleted

## 2014-12-31 NOTE — Telephone Encounter (Signed)
FYI  Call from "[place] with Dr. Arnetha Courser DDS oral surgeon in [place].  This patient's PCP provider Dr. Forde Dandy suggested we call your office.  This patient needs oral surgery but her platelet count is too low for surgery.  We'd like to schedule her for a platelet transfusion before surgery."  Per MOSAIQ, patient was last seen by Dr. Marko Plume February 17, 2010.  Suanne Marker will notify Dr. Jetta Lout.  Per protocol a new patient referral would be needed after three years.

## 2014-12-31 NOTE — Telephone Encounter (Signed)
Pt called POD extension directly. She is asking if Dr Marko Plume can be her hematologist b/c LL was her doctor in the past. I told pt that LL will not be in the office until Monday and she may not be the one who the pt is referred to. The pt was OK with that, she is more concerned about getting in to see a hematologist ASAP b/c her tooth is hurting.

## 2015-01-04 ENCOUNTER — Telehealth: Payer: Self-pay | Admitting: Oncology

## 2015-01-04 NOTE — Telephone Encounter (Signed)
Spoke with Rhys Martini in [facility] and told her that Dr. Marko Plume sent in-basket message regarding new patient referral be set up with one of the new physicians, maybe Dr. Irene Limbo.  Tiffany retrieved in-basket message and will touch base with the oral surgeon's office noted below.

## 2015-01-04 NOTE — Telephone Encounter (Signed)
Pt going to [facility] for her platelet tranfusion.

## 2015-03-28 ENCOUNTER — Encounter: Payer: Self-pay | Admitting: Cardiovascular Disease

## 2015-03-28 ENCOUNTER — Ambulatory Visit (INDEPENDENT_AMBULATORY_CARE_PROVIDER_SITE_OTHER): Payer: Medicare Other | Admitting: Cardiovascular Disease

## 2015-03-28 VITALS — BP 152/80 | HR 84 | Ht 68.0 in | Wt 212.0 lb

## 2015-03-28 DIAGNOSIS — Z79899 Other long term (current) drug therapy: Secondary | ICD-10-CM | POA: Diagnosis not present

## 2015-03-28 DIAGNOSIS — I471 Supraventricular tachycardia: Secondary | ICD-10-CM | POA: Diagnosis not present

## 2015-03-28 DIAGNOSIS — R55 Syncope and collapse: Secondary | ICD-10-CM | POA: Diagnosis not present

## 2015-03-28 DIAGNOSIS — K7460 Unspecified cirrhosis of liver: Secondary | ICD-10-CM

## 2015-03-28 DIAGNOSIS — D696 Thrombocytopenia, unspecified: Secondary | ICD-10-CM

## 2015-03-28 NOTE — Patient Instructions (Signed)
Your physician has recommended that you have a Loop Recorder inserted. A Loop Recorder is a small device that is placed under the skin of your chest.  This is done in the hospital and does not require an overnight stay. Please see the instruction sheet given to you today for more information.  Your physician recommends that you return for lab work in: [facility]

## 2015-03-28 NOTE — Progress Notes (Signed)
Patient ID: [name], female   DOB: 1945-10-29, 70 y.o.   MRN: [id number]     Cardiology Office Note    Date:  03/28/2015   ID:  TIAWANA CLOUSE, DOB 11-13-1945, MRN [id number]  PCP:  Sheela Stack, MD  Cardiologist:   Sanda Klein, MD   Chief Complaint  Patient presents with  . Follow-up    PSVT//pt is having Syncope episodes--per PCP//has not been seen since August 2015  . Chest Pain    pt states thatmore than once she feels her heart racing and her chest feels like it has been beat up--she has passed out more than once  . Edema    constant--bilateral legs/feet/ankles//LASIX not helping  . Hematuria    has gotten better--started after she fell    History of Present Illness:  [name] is a 70 y.o. female with a long-standing history of palpitations related to frequent PACs and paroxysmal supraventricular tachycardia, essential hypertension, obstructive sleep apnea intolerant to CPAP,nonalcoholic steatohepatitis with cirrhosis, complicated by thrombocytopenia (more recently platelet counts in the 30000-40000 range).  For long time now she has experienced unsteadiness in her gait, frequent falls, most of them not associated with any change in level of consciousness. Some of them appear to be worsened by changes in position, consistent with orthostatic hypotension, some of them seem to associate some degree of disorientation and confusion.   She had a fall in her home about 4 weeks ago. She did not experienced syncope at the time. She was evaluated at [facility] no vomiting hospital and released quickly.  She had a clear-cut syncopal event that she reports last Friday. She had felt "weird all day". Then she felt her heart "flying". It was beating both very strong and very fast. After about 10 minutes she lost consciousness. When she woke up her heart was beating normally. She thinks her loss of consciousness lasted no more than 4 or 5 minutes. She was alone at home  at time.  In the past she used to take medications for hypertension. She is currently taking furosemide and spironolactone for lower extremity edema, felt to be related to cirrhosis. Endoscopy in 2015 showed signs of mild portal gastropathy without esophageal varices. Propranolol had to be stopped due to low blood pressures. She continues to have a lot of difficulty sleeping at night and daytime somnolence, symptoms which I think may be related to hepatic encephalopathy. She has frequent liquid stools whether or not she takes lactulose. At least on one occasion she has had bowel incontinence at night. She tells me that she has had evaluation for seizures with negative results in the past.  In 2013 an echocardiogram showed normal left ventricular systolic function, mild concentric LVH with stage I diastolic dysfunction, moderately dilated left atrium, no significant valvular abnormalities, PA pressure estimated at 40-50 mmHg.in 2010 she had a low risk nuclear scan that did show a small abnormality in the apical anterior distribution. This was felt to be "due to attenuation with mild to moderate superimposed ischemia". I don't think she has ever undergone coronary angiography. She has never described angina pectoris.  Past Medical History  Diagnosis Date  . Hypertension   . Restless leg   . Heart murmur   . Shortness of breath     with exertion   . Sleep apnea 2013    sleep study at [facility]    uses cpap  . Diabetes mellitus     no meds  . Cirrhosis ([place]) 2.5  yrs    never a drinker.  platelets  low... wbc also low per pt ..   . Blood dyscrasia     low platelets ... low wbc.  ? liver  disease.   . H/O hiatal hernia   . Headache(784.0)   . Anxiety   . Depression   . Chronic kidney disease 50's    nephritis--none at present *12/03/2011)  . Cancer [facility]) 2005    left breast  . Platelets decreased ([place])     ??   D/T CIRRHOISIS  . Family history of anesthesia complication     Daughter has  complications with Versed  . PSVT (paroxysmal supraventricular tachycardia) ([place])   . Portal hypertension ([place])   . Complication of anesthesia     had difficulty going to sleep, staying asleep during surgery and was combative after procedure.  . Asthma     Past Surgical History  Procedure Laterality Date  . Cholecystectomy    . Appendectomy    . Tonsillectomy    . Abdominal hysterectomy    . Cardiac catheterization  1975    normal cath  -low potassium  . Orif humerus fracture  06/19/2011    Procedure: OPEN REDUCTION INTERNAL FIXATION (ORIF) DISTAL HUMERUS FRACTURE;  Surgeon: [name], MD;  Location: [place];  Service: Orthopedics;  Laterality: Left;  . Breast surgery      left lumpectomy--few nodes removed  . Mastectomy    . Orif humerus fracture  12/11/2011    Procedure: OPEN REDUCTION INTERNAL FIXATION (ORIF) HUMERAL SHAFT FRACTURE;  Surgeon: [name], MD;  Location: [place];  Service: Orthopedics;  Laterality: Left;   Removal of Hardware left humerus, Open Reduction Internal Fixation Left Humeral Shaft Fracture Non-Union  . US echocardiography  03/16/2011    mild concentric LVH,mod LAE,stage I diastolic dysfunction,MAC,mild MR,aortic sclerosis,mild TR  . Nm myocar perf wall motion  08/17/2008    mild to mod. superimposed ischemiamid antereoseptal,apical anterior & apical regions  . Colonoscopy w/ biopsies and polypectomy    . Esophagogastroduodenoscopy (egd) with propofol N/A 10/21/2013    Procedure: ESOPHAGOGASTRODUODENOSCOPY (EGD) WITH PROPOFOL;  Surgeon: [name], MD;  Location: [facility] ENDOSCOPY;  Service: Endoscopy;  Laterality: N/A;  . Colonoscopy with propofol N/A 10/21/2013    Procedure: COLONOSCOPY WITH PROPOFOL;  Surgeon: [name], MD;  Location: [facility] ENDOSCOPY;  Service: Endoscopy;  Laterality: N/A;  ultra slim scope     Outpatient Prescriptions Prior to Visit  Medication Sig Dispense Refill  . buPROPion (WELLBUTRIN) 75 MG tablet Take 75 mg by  mouth 3 (three) times daily.     . diazepam (VALIUM) 5 MG tablet Take 5 mg by mouth every 6 (six) hours as needed for anxiety.     . furosemide (LASIX) 20 MG tablet Take 20 mg by mouth daily. Take 40 mg in the AM and 20 mg in the PM    . HYDROcodone-acetaminophen (NORCO) 10-325 MG per tablet Take 1-2 tablets by mouth every 6 (six) hours as needed for moderate pain.    [name] lactulose (CHRONULAC) 10 GM/15ML solution Take 45 g by mouth daily as needed for mild constipation.     [name] oxyCODONE (OXY IR/ROXICODONE) 5 MG immediate release tablet Take 1 tablet (5 mg total) by mouth every 4 (four) hours as needed (for pain). 20 tablet 0  . potassium chloride SA (K-DUR,KLOR-CON) 20 MEQ tablet Take 1 tablet (20 mEq total) by mouth 2 (two) times daily. 10 tablet 0  . promethazine (  PHENERGAN) 12.5 MG tablet Take 12.5 mg by mouth every 6 (six) hours as needed for nausea.     [name] rOPINIRole (REQUIP) 1 MG tablet Take 1-2 mg by mouth at bedtime.     [name] spironolactone (ALDACTONE) 100 MG tablet Take 100 mg by mouth daily.    . traZODone (DESYREL) 50 MG tablet Take 50 mg by mouth at bedtime.    [name] albuterol (PROVENTIL HFA;VENTOLIN HFA) 108 (90 BASE) MCG/ACT inhaler Inhale 2 puffs into the lungs every 6 (six) hours as needed for wheezing or shortness of breath.    . clindamycin (CLEOCIN) 150 MG capsule Take 1 capsule (150 mg total) by mouth every 6 (six) hours. 28 capsule 0  . fentaNYL (DURAGESIC - DOSED MCG/HR) 75 MCG/HR Place 1 patch onto the skin every 3 (three) days as needed.      No facility-administered medications prior to visit.     Allergies:   Ondansetron hcl; Penicillins; Aspirin; Midazolam hcl; and Niacin and related   Social History   Social History  . Marital Status: Widowed    Spouse Name: N/A  . Number of Children: N/A  . Years of Education: N/A   Social History Main Topics  . Smoking status: Never Smoker   . Smokeless tobacco: Never Used  . Alcohol Use: No  . Drug Use: No  . Sexual Activity: No     Other Topics Concern  . None   Social History Narrative     Family History:  The patient's family history includes Anesthesia problems in her daughter; Hyperlipidemia in her brother; Stroke in her father.   ROS:   Please see the history of present illness.    ROS All other systems reviewed and are negative.   PHYSICAL EXAM:   VS:  BP 152/80 mmHg  Pulse 84  Ht 5\' 8"  (1.727 m)  Wt 212 lb (96.163 kg)  BMI 32.24 kg/m2   GEN: Well nourished, well developed, in no acute distress HEENT: normal Neck: no JVD, carotid bruits, or masses Cardiac: RRR; no murmurs, rubs, or gallops,no edema  Respiratory:  clear to auscultation bilaterally, normal work of breathing GI: soft, nontender, nondistended, + BS MS: no deformity or atrophy Skin: warm and dry, no rash Neuro:  Alert and Oriented x 3, Strength and sensation are intact Psych: euthymic mood, full affect  Wt Readings from Last 3 Encounters:  03/28/15 212 lb (96.163 kg)  07/01/14 215 lb (97.523 kg)  06/29/14 215 lb (97.523 kg)      Studies/Labs Reviewed:   EKG:  EKG is ordered today.  The ekg ordered today demonstrates sinus rhythm with frequent premature atrial complexes and probable blocked PACs as well.  Recent Labs: 06/29/2014: ALT 21 07/01/2014: BUN 15; Creatinine, Ser 0.63; Hemoglobin 11.6*; Platelets 34*; Potassium 3.6; Sodium 139   Lipid Panel No results found for: CHOL, TRIG, HDL, CHOLHDL, VLDL, LDLCALC, LDLDIRECT  Additional studies/ records that were reviewed today include:  [facility] in [place] records are significant for the absence of major orthopedic injuries and numerous x-rays, INR of 1.3 despite no anticoagulant, platelet count of 52K, WBC 1.8K, hemoglobin 11.9, bilirubin 2.78, albumin 3.3    ASSESSMENT:    1. Syncope, unspecified syncope type   2. PSVT (paroxysmal supraventricular tachycardia) ([place])   3. Cirrhosis, non-alcoholic ([place])   4. Thrombocytopenia, unspecified ([place])   5.  Medication management      PLAN:  In order of problems listed above:  1. Syncope - etiology is unclear. However, syncope  was preceded by rapid palpitations. This may have represented sinus tachycardia in a patient with hypotension or a true serious arrhythmia. She has previously been intolerant to empirical treatment with beta blockers. I have recommended an implantable loop recorder. She has previously worn an event monitor that showed brief episodes of ectopic atrial tachycardia, unlikely to cause syncope. The risk of bleeding with loop recorder implantation is obviously higher due to thrombocytopenia and mild coagulopathy. I do not think the risk is prohibitive. We'll likely have to hold pressure for a much longer period of time.This procedure has been fully reviewed with the patient and informed consent has been obtained. 2. Ectopic atrial tachycardia, previously recorded by event monitor probably explains her palpitations, but will be unlikely to cause syncope. 3. Cirrhosis: she has evidence of both parenchymal insufficiency and portal hypertension. 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 4. Thrombocytopenia: will recheck a CBC just before loop recorder implantation 5. Mrs. Riddley takes numerous medications with sedative or other types of CNS effect including 2 different benzodiazepines, 2 different narcotic analgesics, promethazine, trazodone and Wellbutrin. It is quite possible that some of the disorientation, disequilibrium and falls that she experiences are related to the use of these medications. Some of them are expected to have worse than usual side effects in a patient with advanced liver disease. I have advised her to use these medications sparingly or not at all, especially the benzodiazepines and opiates.    Medication Adjustments/Labs and Tests Ordered: Current medicines are reviewed at length with the patient today.   Concerns regarding medicines are outlined above.  Medication changes, Labs and Tests ordered today are listed in the Patient Instructions below. Patient Instructions  Your physician has recommended that you have a Loop Recorder inserted. A Loop Recorder is a small device that is placed under the skin of your chest.  This is done in the hospital and does not require an overnight stay. Please see the instruction sheet given to you today for more information.  Your physician recommends that you return for lab work in: [facility]       Signed, Sanda Klein, MD  03/28/2015 5:25 PM    [address], [place],   [id number] Phone: [phone]; Fax: [phone]

## 2015-03-29 ENCOUNTER — Telehealth: Payer: Self-pay | Admitting: Cardiovascular Disease

## 2015-03-29 LAB — CBC
HCT: 35.8 % — ABNORMAL LOW (ref 36.0–46.0)
Hemoglobin: 11.9 g/dL — ABNORMAL LOW (ref 12.0–15.0)
MCH: 31.4 pg (ref 26.0–34.0)
MCHC: 33.2 g/dL (ref 30.0–36.0)
MCV: 94.5 fL (ref 78.0–100.0)
MPV: 10.7 fL (ref 8.6–12.4)
PLATELETS: 49 10*3/uL — AB (ref 150–400)
RBC: 3.79 MIL/uL — ABNORMAL LOW (ref 3.87–5.11)
RDW: 17.6 % — ABNORMAL HIGH (ref 11.5–15.5)
WBC: 3 10*3/uL — ABNORMAL LOW (ref 4.0–10.5)

## 2015-03-29 NOTE — Telephone Encounter (Signed)
Spoke with [name], pt has a platelet count low at 49. Results are available in EPIC. Will make dr croitoru aware

## 2015-03-29 NOTE — Telephone Encounter (Signed)
[name] is calling to report an alert Platet count ,

## 2015-03-29 NOTE — Telephone Encounter (Signed)
Yes, aware of result. Chronic low platelets.

## 2015-03-31 ENCOUNTER — Telehealth: Payer: Self-pay | Admitting: Cardiovascular Disease

## 2015-03-31 NOTE — Telephone Encounter (Signed)
Pt having loop recorder put in and has a few questions-pls call

## 2015-03-31 NOTE — Telephone Encounter (Signed)
Patient has question about loop implant. 1) she was concerned that the recorder is placed on the left side of her chest and she has had breast cancer on the left side. RN informed her that it is placed just below the skin.  2)wanted to know if she can have something the morning of procedure.she is schedule to have loop implant at 5:30 pm on Tuesday. Instruction stated to be NPO AS OF MIDNIGHT the  beginning of Tuesday.  3)she wanted to know her lab value plt, hgb.from 03/28/15 Results given. Patient aware will defer to Dr C.and will call her back

## 2015-03-31 NOTE — Telephone Encounter (Signed)
F/u  Pt calling about additional questions concerning upcoming loope recorder implant. Please call back and discuss.

## 2015-04-01 NOTE — Telephone Encounter (Signed)
She does not have to be NPO for the procedure at all, lett alone starting at midnight. She can eat whatever she wants that day. I don't think she should have any concerns as far as the previous history of breast cancer. The device was placed immediately to the left of her breastbone. Thank you for giving her the lab results. [name] is talking to her

## 2015-04-01 NOTE — Telephone Encounter (Signed)
Informed patient she does not need to be NPO prior to Loop Implant.  Still very nervous.  Reassured her and seemed more relaxed sounding after our conversation.

## 2015-04-05 ENCOUNTER — Ambulatory Visit (HOSPITAL_COMMUNITY)
Admission: RE | Admit: 2015-04-05 | Discharge: 2015-04-05 | Disposition: A | Discharge status: Home / Self Care | Payer: Medicare Other | Source: Ambulatory Visit | Attending: Cardiovascular Disease | Admitting: Cardiovascular Disease

## 2015-04-05 ENCOUNTER — Other Ambulatory Visit: Payer: Self-pay | Admitting: *Deleted

## 2015-04-05 ENCOUNTER — Encounter (HOSPITAL_COMMUNITY)
Admission: RE | Disposition: A | Discharge status: Home / Self Care | Payer: Self-pay | Source: Ambulatory Visit | Attending: Cardiovascular Disease

## 2015-04-05 DIAGNOSIS — Z880 Allergy status to penicillin: Secondary | ICD-10-CM | POA: Diagnosis not present

## 2015-04-05 DIAGNOSIS — K7460 Unspecified cirrhosis of liver: Secondary | ICD-10-CM | POA: Diagnosis not present

## 2015-04-05 DIAGNOSIS — I471 Supraventricular tachycardia: Secondary | ICD-10-CM | POA: Diagnosis not present

## 2015-04-05 DIAGNOSIS — R319 Hematuria, unspecified: Secondary | ICD-10-CM | POA: Diagnosis not present

## 2015-04-05 DIAGNOSIS — F329 Major depressive disorder, single episode, unspecified: Secondary | ICD-10-CM | POA: Diagnosis not present

## 2015-04-05 DIAGNOSIS — I951 Orthostatic hypotension: Secondary | ICD-10-CM | POA: Insufficient documentation

## 2015-04-05 DIAGNOSIS — D6959 Other secondary thrombocytopenia: Secondary | ICD-10-CM | POA: Insufficient documentation

## 2015-04-05 DIAGNOSIS — I129 Hypertensive chronic kidney disease with stage 1 through stage 4 chronic kidney disease, or unspecified chronic kidney disease: Secondary | ICD-10-CM | POA: Diagnosis not present

## 2015-04-05 DIAGNOSIS — R55 Syncope and collapse: Secondary | ICD-10-CM | POA: Diagnosis present

## 2015-04-05 DIAGNOSIS — R609 Edema, unspecified: Secondary | ICD-10-CM | POA: Insufficient documentation

## 2015-04-05 DIAGNOSIS — K766 Portal hypertension: Secondary | ICD-10-CM | POA: Diagnosis not present

## 2015-04-05 DIAGNOSIS — G2581 Restless legs syndrome: Secondary | ICD-10-CM | POA: Diagnosis not present

## 2015-04-05 DIAGNOSIS — F419 Anxiety disorder, unspecified: Secondary | ICD-10-CM | POA: Diagnosis not present

## 2015-04-05 DIAGNOSIS — J45909 Unspecified asthma, uncomplicated: Secondary | ICD-10-CM | POA: Diagnosis not present

## 2015-04-05 DIAGNOSIS — N189 Chronic kidney disease, unspecified: Secondary | ICD-10-CM | POA: Insufficient documentation

## 2015-04-05 DIAGNOSIS — G4733 Obstructive sleep apnea (adult) (pediatric): Secondary | ICD-10-CM | POA: Diagnosis not present

## 2015-04-05 DIAGNOSIS — K7581 Nonalcoholic steatohepatitis (NASH): Secondary | ICD-10-CM | POA: Insufficient documentation

## 2015-04-05 DIAGNOSIS — E1122 Type 2 diabetes mellitus with diabetic chronic kidney disease: Secondary | ICD-10-CM | POA: Insufficient documentation

## 2015-04-05 HISTORY — PX: EP IMPLANTABLE DEVICE: SHX172B

## 2015-04-05 SURGERY — LOOP RECORDER INSERTION

## 2015-04-05 MED ORDER — LIDOCAINE-EPINEPHRINE 1 %-1:100000 IJ SOLN
INTRAMUSCULAR | Status: DC | PRN
  Administered 2015-04-05: 10 mL via INTRADERMAL

## 2015-04-05 MED ORDER — LIDOCAINE-EPINEPHRINE 1 %-1:100000 IJ SOLN
INTRAMUSCULAR | Status: AC
  Filled 2015-04-05: qty 1

## 2015-04-05 SURGICAL SUPPLY — 2 items
LOOP REVEAL LINQSYS (Prosthesis & Implant Heart) ×2 IMPLANT
PACK LOOP INSERTION (CUSTOM PROCEDURE TRAY) ×3 IMPLANT

## 2015-04-05 NOTE — Op Note (Signed)
LOOP RECORDER IMPLANT   Procedure report  Procedure performed:  Loop recorder implantation   Reason for procedure:  Recurrent syncope  Procedure performed by:  Sanda Klein, MD  Complications:  None  Estimated blood loss:  <5 mL  Medications administered during procedure:  Lidocaine 1% with 1/10,000 epinephrine 10 mL locally Device details:  Medtronic Reveal Linq model number [id number], serial number [id number] Procedure details:  After the risks and benefits of the procedure were discussed the patient provided informed consent. The patient was prepped and draped in usual sterile fashion. Local anesthesia was administered to an area 2 cm to the left of the sternum in the 4th intercostal space. A cutaneous incision was made using the incision tool. The introducer was then used to create a subcutaneous tunnel and carefully deploy the device. Local pressure was held to ensure hemostasis.  The incision was closed with SteriStrips and a sterile dressing was applied.  R waves : 1.0 mV  Sanda Klein, MD, [facility] [phone] office [phone] pager 04/05/2015 2:05 PM

## 2015-04-05 NOTE — H&P (View-Only) (Signed)
Patient ID: [name], female   DOB: 1945-11-03, 70 y.o.   MRN: [id number]     Cardiology Office Note    Date:  03/28/2015   ID:  [name], DOB [birth year], MRN [id number]  PCP:  Sheela Stack, MD  Cardiologist:   Sanda Klein, MD   Chief Complaint  Patient presents with  . Follow-up    PSVT//pt is having Syncope episodes--per PCP//has not been seen since August 2015  . Chest Pain    pt states thatmore than once she feels her heart racing and her chest feels like it has been beat up--she has passed out more than once  . Edema    constant--bilateral legs/feet/ankles//LASIX not helping  . Hematuria    has gotten better--started after she fell    History of Present Illness:  [name] is a 70 y.o. female with a long-standing history of palpitations related to frequent PACs and paroxysmal supraventricular tachycardia, essential hypertension, obstructive sleep apnea intolerant to CPAP,nonalcoholic steatohepatitis with cirrhosis, complicated by thrombocytopenia (more recently platelet counts in the 30000-40000 range).  For long time now she has experienced unsteadiness in her gait, frequent falls, most of them not associated with any change in level of consciousness. Some of them appear to be worsened by changes in position, consistent with orthostatic hypotension, some of them seem to associate some degree of disorientation and confusion.   She had a fall in her home about 4 weeks ago. She did not experienced syncope at the time. She was evaluated at [facility] no vomiting hospital and released quickly.  She had a clear-cut syncopal event that she reports last Friday. She had felt "weird all day". Then she felt her heart "flying". It was beating both very strong and very fast. After about 10 minutes she lost consciousness. When she woke up her heart was beating normally. She thinks her loss of consciousness lasted no more than 4 or 5 minutes. She was alone at home  at time.  In the past she used to take medications for hypertension. She is currently taking furosemide and spironolactone for lower extremity edema, felt to be related to cirrhosis. Endoscopy in 2015 showed signs of mild portal gastropathy without esophageal varices. Propranolol had to be stopped due to low blood pressures. She continues to have a lot of difficulty sleeping at night and daytime somnolence, symptoms which I think may be related to hepatic encephalopathy. She has frequent liquid stools whether or not she takes lactulose. At least on one occasion she has had bowel incontinence at night. She tells me that she has had evaluation for seizures with negative results in the past.  In 2013 an echocardiogram showed normal left ventricular systolic function, mild concentric LVH with stage I diastolic dysfunction, moderately dilated left atrium, no significant valvular abnormalities, PA pressure estimated at 40-50 mmHg.in 2010 she had a low risk nuclear scan that did show a small abnormality in the apical anterior distribution. This was felt to be "due to attenuation with mild to moderate superimposed ischemia". I don't think she has ever undergone coronary angiography. She has never described angina pectoris.  Past Medical History  Diagnosis Date  . Hypertension   . Restless leg   . Heart murmur   . Shortness of breath     with exertion   . Sleep apnea 2013    sleep study at [facility]    uses cpap  . Diabetes mellitus     no meds  . Cirrhosis ([place]) 2.5  yrs    never a drinker.  platelets  low... wbc also low per pt ..   . Blood dyscrasia     low platelets ... low wbc.  ? liver  disease.   . H/O hiatal hernia   . Headache(784.0)   . Anxiety   . Depression   . Chronic kidney disease 50's    nephritis--none at present *12/03/2011)  . Cancer [facility]) 2005    left breast  . Platelets decreased ([place])     ??   D/T CIRRHOISIS  . Family history of anesthesia complication     Daughter has  complications with Versed  . PSVT (paroxysmal supraventricular tachycardia) ([place])   . Portal hypertension ([place])   . Complication of anesthesia     had difficulty going to sleep, staying asleep during surgery and was combative after procedure.  . Asthma     Past Surgical History  Procedure Laterality Date  . Cholecystectomy    . Appendectomy    . Tonsillectomy    . Abdominal hysterectomy    . Cardiac catheterization  1975    normal cath  -low potassium  . Orif humerus fracture  06/19/2011    Procedure: OPEN REDUCTION INTERNAL FIXATION (ORIF) DISTAL HUMERUS FRACTURE;  Surgeon: [name], MD;  Location: [place];  Service: Orthopedics;  Laterality: Left;  . Breast surgery      left lumpectomy--few nodes removed  . Mastectomy    . Orif humerus fracture  12/11/2011    Procedure: OPEN REDUCTION INTERNAL FIXATION (ORIF) HUMERAL SHAFT FRACTURE;  Surgeon: [name], MD;  Location: [place];  Service: Orthopedics;  Laterality: Left;   Removal of Hardware left humerus, Open Reduction Internal Fixation Left Humeral Shaft Fracture Non-[place]  . US echocardiography  03/16/2011    mild concentric LVH,mod LAE,stage I diastolic dysfunction,MAC,mild MR,aortic sclerosis,mild TR  . Nm myocar perf wall motion  08/17/2008    mild to mod. superimposed ischemiamid antereoseptal,apical anterior & apical regions  . Colonoscopy w/ biopsies and polypectomy    . Esophagogastroduodenoscopy (egd) with propofol N/A 10/21/2013    Procedure: ESOPHAGOGASTRODUODENOSCOPY (EGD) WITH PROPOFOL;  Surgeon: [name], MD;  Location: [facility] ENDOSCOPY;  Service: Endoscopy;  Laterality: N/A;  . Colonoscopy with propofol N/A 10/21/2013    Procedure: COLONOSCOPY WITH PROPOFOL;  Surgeon: [name], MD;  [facility] END[facility];  Service: Endoscopy;  Laterality: N/A;  ultra slim scope     Outpatient Prescriptions Prior to Visit  Medication Sig Dispense Refill  . buPROPion (WELLBUTRIN) 75 MG tablet Take 75 mg by  mouth 3 (three) times daily.     . diazepam (VALIUM) 5 MG tablet Take 5 mg by mouth every 6 (six) hours as needed for anxiety.     . furosemide (LASIX) 20 MG tablet Take 20 mg by mouth daily. Take 40 mg in the AM and 20 mg in the PM    . HYDROcodone-acetaminophen (NORCO) 10-325 MG per tablet Take 1-2 tablets by mouth every 6 (six) hours as needed for moderate pain.    [name] lactulose (CHRONULAC) 10 GM/15ML solution Take 45 g by mouth daily as needed for mild constipation.     [name] oxyCODONE (OXY IR/ROXICODONE) 5 MG immediate release tablet Take 1 tablet (5 mg total) by mouth every 4 (four) hours as needed (for pain). 20 tablet 0  . potassium chloride SA (K-DUR,KLOR-CON) 20 MEQ tablet Take 1 tablet (20 mEq total) by mouth 2 (two) times daily. 10 tablet 0  . promethazine (  PHENERGAN) 12.5 MG tablet Take 12.5 mg by mouth every 6 (six) hours as needed for nausea.     [name] rOPINIRole (REQUIP) 1 MG tablet Take 1-2 mg by mouth at bedtime.     [name] spironolactone (ALDACTONE) 100 MG tablet Take 100 mg by mouth daily.    . traZODone (DESYREL) 50 MG tablet Take 50 mg by mouth at bedtime.    [name] albuterol (PROVENTIL HFA;VENTOLIN HFA) 108 (90 BASE) MCG/ACT inhaler Inhale 2 puffs into the lungs every 6 (six) hours as needed for wheezing or shortness of breath.    . clindamycin (CLEOCIN) 150 MG capsule Take 1 capsule (150 mg total) by mouth every 6 (six) hours. 28 capsule 0  . fentaNYL (DURAGESIC - DOSED MCG/HR) 75 MCG/HR Place 1 patch onto the skin every 3 (three) days as needed.      No facility-administered medications prior to visit.     Allergies:   Ondansetron hcl; Penicillins; Aspirin; Midazolam hcl; and Niacin and related   Social History   Social History  . Marital Status: Widowed    Spouse Name: N/A  . Number of Children: N/A  . Years of Education: N/A   Social History Main Topics  . Smoking status: Never Smoker   . Smokeless tobacco: Never Used  . Alcohol Use: No  . Drug Use: No  . Sexual Activity: No     Other Topics Concern  . None   Social History Narrative     Family History:  The patient's family history includes Anesthesia problems in her daughter; Hyperlipidemia in her brother; Stroke in her father.   ROS:   Please see the history of present illness.    ROS All other systems reviewed and are negative.   PHYSICAL EXAM:   VS:  BP 152/80 mmHg  Pulse 84  Ht 5\' 8"  (1.727 m)  Wt 212 lb (96.163 kg)  BMI 32.24 kg/m2   GEN: Well nourished, well developed, in no acute distress HEENT: normal Neck: no JVD, carotid bruits, or masses Cardiac: RRR; no murmurs, rubs, or gallops,no edema  Respiratory:  clear to auscultation bilaterally, normal work of breathing GI: soft, nontender, nondistended, + BS MS: no deformity or atrophy Skin: warm and dry, no rash Neuro:  Alert and Oriented x 3, Strength and sensation are intact Psych: euthymic mood, full affect  Wt Readings from Last 3 Encounters:  03/28/15 212 lb (96.163 kg)  07/01/14 215 lb (97.523 kg)  06/29/14 215 lb (97.523 kg)      Studies/Labs Reviewed:   EKG:  EKG is ordered today.  The ekg ordered today demonstrates sinus rhythm with frequent premature atrial complexes and probable blocked PACs as well.  Recent Labs: 06/29/2014: ALT 21 07/01/2014: BUN 15; Creatinine, Ser 0.63; Hemoglobin 11.6*; Platelets 34*; Potassium 3.6; Sodium 139   Lipid Panel No results found for: CHOL, TRIG, HDL, CHOLHDL, VLDL, LDLCALC, LDLDIRECT  Additional studies/ records that were reviewed today include:  [facility] [place] records are significant for the absence of major orthopedic injuries and numerous x-rays, INR of 1.3 despite no anticoagulant, platelet count of 52K, WBC 1.8K, hemoglobin 11.9, bilirubin 2.78, albumin 3.3    ASSESSMENT:    1. Syncope, unspecified syncope type   2. PSVT (paroxysmal supraventricular tachycardia) ([place])   3. Cirrhosis, non-alcoholic ([place])   4. Thrombocytopenia, unspecified ([place])   5.  Medication management      PLAN:  In order of problems listed above:  1. Syncope - etiology is unclear. However, syncope  was preceded by rapid palpitations. This may have represented sinus tachycardia in a patient with hypotension or a true serious arrhythmia. She has previously been intolerant to empirical treatment with beta blockers. I have recommended an implantable loop recorder. She has previously worn an event monitor that showed brief episodes of ectopic atrial tachycardia, unlikely to cause syncope. The risk of bleeding with loop recorder implantation is obviously higher due to thrombocytopenia and mild coagulopathy. I do not think the risk is prohibitive. We'll likely have to hold pressure for a much longer period of time.This procedure has been fully reviewed with the patient and informed consent has been obtained. 2. Ectopic atrial tachycardia, previously recorded by event monitor probably explains her palpitations, but will be unlikely to cause syncope. 3. Cirrhosis: she has evidence of both parenchymal insufficiency and portal hypertension. 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 4. Thrombocytopenia: will recheck a CBC just before loop recorder implantation 5. Mrs. Rouse takes numerous medications with sedative or other types of CNS effect including 2 different benzodiazepines, 2 different narcotic analgesics, promethazine, trazodone and Wellbutrin. It is quite possible that some of the disorientation, disequilibrium and falls that she experiences are related to the use of these medications. Some of them are expected to have worse than usual side effects in a patient with advanced liver disease. I have advised her to use these medications sparingly or not at all, especially the benzodiazepines and opiates.    Medication Adjustments/Labs and Tests Ordered: Current medicines are reviewed at length with the patient today.   Concerns regarding medicines are outlined above.  Medication changes, Labs and Tests ordered today are listed in the Patient Instructions below. Patient Instructions  Your physician has recommended that you have a Loop Recorder inserted. A Loop Recorder is a small device that is placed under the skin of your chest.  This is done in the hospital and does not require an overnight stay. Please see the instruction sheet given to you today for more information.  Your physician recommends that you return for lab work in: [facility]       Signed, Sanda Klein, MD  03/28/2015 5:25 PM    [address], [place], [place]  [address] Phone: [phone]; Fax: [phone]

## 2015-04-05 NOTE — Interval H&P Note (Signed)
History and Physical Interval Note:  04/05/2015 2:03 PM  [name]  has presented today for surgery, with the diagnosis of syncope  The various methods of treatment have been discussed with the patient and family. After consideration of risks, benefits and other options for treatment, the patient has consented to  Procedure(s): Loop Recorder Insertion (N/A) as a surgical intervention .  The patient's history has been reviewed, patient examined, no change in status, stable for surgery.  I have reviewed the patient's chart and labs.  Questions were answered to the patient's satisfaction.     Sherrian Nunnelley

## 2015-04-06 ENCOUNTER — Encounter (HOSPITAL_COMMUNITY): Payer: Self-pay | Admitting: Cardiovascular Disease

## 2015-04-11 ENCOUNTER — Telehealth: Payer: Self-pay | Admitting: Cardiovascular Disease

## 2015-04-11 NOTE — Telephone Encounter (Signed)
Returned patient's call. Ok to remove outer dressing of tegaderm/gauze but to leave strips in place that are directly over the incision. She is agreeable. Confirmed appt with device clinic 04/15/15.

## 2015-04-11 NOTE — Telephone Encounter (Signed)
New Message  Pt request a call back to discuss if its ok to take the patch off. She doesn't believe that it will bleed anymore. Should she take it off??

## 2015-04-15 ENCOUNTER — Ambulatory Visit (INDEPENDENT_AMBULATORY_CARE_PROVIDER_SITE_OTHER): Payer: Medicare Other | Admitting: *Deleted

## 2015-04-15 DIAGNOSIS — R55 Syncope and collapse: Secondary | ICD-10-CM

## 2015-04-15 LAB — CUP PACEART INCLINIC DEVICE CHECK: Date Time Interrogation Session: 20170210163924

## 2015-04-15 NOTE — Progress Notes (Signed)
Wound check in clinic s/p ILR implant. Wound well healed without redness or edema. Incision edges approximated. Battery status: GOOD. R-waves 0.53 mV. 0 symptom episodes, 0 tachy episodes, 0 pause episodes, 0 brady episodes. 0 AF episodes (0% burden). Monthly summary reports and ROV with [facility] in 3 months.

## 2015-05-05 ENCOUNTER — Ambulatory Visit (INDEPENDENT_AMBULATORY_CARE_PROVIDER_SITE_OTHER): Payer: Medicare Other | Admitting: *Deleted

## 2015-05-05 DIAGNOSIS — R55 Syncope and collapse: Secondary | ICD-10-CM | POA: Diagnosis not present

## 2015-05-06 NOTE — Progress Notes (Signed)
Carelink Summary Report / Loop Recorder 

## 2015-05-13 LAB — CUP PACEART REMOTE DEVICE CHECK: Date Time Interrogation Session: 20170302183614

## 2015-05-13 NOTE — Progress Notes (Signed)
Carelink summary report received. Battery status OK. Normal device function. No new symptom episodes, tachy episodes, brady, or pause episodes. No new AF episodes. Monthly summary reports and ROV/PRN 

## 2015-06-06 ENCOUNTER — Ambulatory Visit (INDEPENDENT_AMBULATORY_CARE_PROVIDER_SITE_OTHER): Payer: Medicare Other | Admitting: *Deleted

## 2015-06-06 DIAGNOSIS — R55 Syncope and collapse: Secondary | ICD-10-CM

## 2015-06-06 LAB — CUP PACEART REMOTE DEVICE CHECK: Date Time Interrogation Session: 20170401190550

## 2015-06-06 NOTE — Progress Notes (Signed)
Carelink Summary Report / Loop Recorder 

## 2015-06-09 ENCOUNTER — Telehealth: Payer: Self-pay | Admitting: Cardiovascular Disease

## 2015-06-09 NOTE — Telephone Encounter (Signed)
Returned patient's call.  Patient states her home monitor is "hot" and has not transmitted since 05/30/15.  Walked patient through manual transmission and advised that it was successfully received.  Advised patient that she can call tech services if she is concerned about the monitor overheating.  Patient verbalizes appreciation of assistance and instructions.  She denies additional questions or concerns at this time.

## 2015-06-09 NOTE — Telephone Encounter (Signed)
New Message  Pt requested to speak w/ Device about her loop recorder. Please call back and discuss.

## 2015-07-04 ENCOUNTER — Ambulatory Visit (INDEPENDENT_AMBULATORY_CARE_PROVIDER_SITE_OTHER): Payer: Medicare Other | Admitting: *Deleted

## 2015-07-04 DIAGNOSIS — R55 Syncope and collapse: Secondary | ICD-10-CM

## 2015-07-05 NOTE — Progress Notes (Signed)
Carelink Summary Report / Loop Recorder 

## 2015-07-11 DIAGNOSIS — Z4509 Encounter for adjustment and management of other cardiac device: Secondary | ICD-10-CM | POA: Insufficient documentation

## 2015-07-11 NOTE — Progress Notes (Signed)
Patient ID: [name], female   DOB: 25-Jun-1945, 70 y.o.   MRN: [id number]    Cardiology Office Note    Date:  07/12/2015   ID:  [name], DOB [birth year], MRN [id number]  PCP:  Sheela Stack, MD  Cardiologist:   Sanda Klein, MD   Chief Complaint  Patient presents with  . Follow-up    Pacer Check, no chest pain    History of Present Illness:  [name] is a 70 y.o. female with a long-standing history of palpitations related to frequent PACs and paroxysmal supraventricular tachycardia, essential hypertension, obstructive sleep apnea intolerant to CPAP,nonalcoholic steatohepatitis with cirrhosis, complicated by thrombocytopenia. She received an implantable loop recorder after a syncopal event accompanied by rapid palpitations in late January 2017.  Since device implantation she has not experienced syncope. The device has not recorded meaningful arrhythmia.  She complains of fatigue and watery diarrhea (not taking her lactulose right now). She had a bone marrow biopsy which was "good" (hypercellular, normal karyotype and FISH-MDS studies). Platelets down to 37k,mild leukopenia and very mild anemia. Her hematologist is Dr. Vira Agar at [facility].  In the past she used to take medications for hypertension. She is currently taking furosemide and spironolactone for lower extremity edema, felt to be related to cirrhosis. Endoscopy in 2015 showed signs of mild portal gastropathy without esophageal varices. Propranolol had to be stopped due to low blood pressures. She has had symptoms of hepatic encephalopathy. She has had evaluation for seizures with negative results in the past.  In 2013 an echocardiogram showed normal left ventricular systolic function, mild concentric LVH with stage I diastolic dysfunction, moderately dilated left atrium, no significant valvular abnormalities, PA pressure estimated at 40-50 mmHg. In 2010 she had a low risk nuclear scan that did show  a small abnormality in the apical anterior distribution. This was felt to be "due to attenuation with mild to moderate superimposed ischemia". I don't think she has ever undergone coronary angiography. She has never described angina pectoris.  Past Medical History  Diagnosis Date  . Hypertension   . Restless leg   . Heart murmur   . Shortness of breath     with exertion   . Sleep apnea 2013    sleep study at [facility]    uses cpap  . Diabetes mellitus     no meds  . Cirrhosis ([place]) 2.5 yrs    never a drinker.  platelets  low... wbc also low per pt ..   . Blood dyscrasia     low platelets ... low wbc.  ? liver  disease.   . H/O hiatal hernia   . Headache(784.0)   . Anxiety   . Depression   . Chronic kidney disease 50's    nephritis--none at present *12/03/2011)  . Cancer [facility]) 2005    left breast  . Platelets decreased ([place])     ??   D/T CIRRHOISIS  . Family history of anesthesia complication     Daughter has complications with Versed  . PSVT (paroxysmal supraventricular tachycardia) ([place])   . Portal hypertension ([place])   . Complication of anesthesia     had difficulty going to sleep, staying asleep during surgery and was combative after procedure.  . Asthma     Past Surgical History  Procedure Laterality Date  . Cholecystectomy    . Appendectomy    . Tonsillectomy    . Abdominal hysterectomy    . Cardiac catheterization  1975    normal  cath  -low potassium  . Orif humerus fracture  06/19/2011    Procedure: OPEN REDUCTION INTERNAL FIXATION (ORIF) DISTAL HUMERUS FRACTURE;  Surgeon: [name], MD;  Location: [place];  Service: Orthopedics;  Laterality: Left;  . Breast surgery      left lumpectomy--few nodes removed  . Mastectomy    . Orif humerus fracture  12/11/2011    Procedure: OPEN REDUCTION INTERNAL FIXATION (ORIF) HUMERAL SHAFT FRACTURE;  Surgeon: [name], MD;  Location: [place];  Service: Orthopedics;  Laterality: Left;   Removal of Hardware left  humerus, Open Reduction Internal Fixation Left Humeral Shaft Fracture Non-Union  . US echocardiography  03/16/2011    mild concentric LVH,mod LAE,stage I diastolic dysfunction,MAC,mild MR,aortic sclerosis,mild TR  . Nm myocar perf wall motion  08/17/2008    mild to mod. superimposed ischemiamid antereoseptal,apical anterior & apical regions  . Colonoscopy w/ biopsies and polypectomy    . Esophagogastroduodenoscopy (egd) with propofol N/A 10/21/2013    Procedure: ESOPHAGOGASTRODUODENOSCOPY (EGD) WITH PROPOFOL;  Surgeon: [name], MD;  Location: [facility] ENDOSCOPY;  Service: Endoscopy;  Laterality: N/A;  . Colonoscopy with propofol N/A 10/21/2013    Procedure: COLONOSCOPY WITH PROPOFOL;  Surgeon: [name], MD;  Location: [facility] ENDOSCOPY;  Service: Endoscopy;  Laterality: N/A;  ultra slim scope   . Ep implantable device N/A 04/05/2015    Procedure: Loop Recorder Insertion;  Surgeon: Sanda Klein, MD;  Location: [place] CV LAB;  Service: Cardiovascular;  Laterality: N/A;    Current Medications: Outpatient Prescriptions Prior to Visit  Medication Sig Dispense Refill  . ALPRAZolam (XANAX) 0.25 MG tablet Take 1 tablet by mouth 2 (two) times daily as needed for anxiety.     [name] buPROPion (WELLBUTRIN) 75 MG tablet Take 75-150 mg by mouth 2 (two) times daily. 23m in the morning, 1537min the evening    . diazepam (VALIUM) 5 MG tablet Take 5 mg by mouth every 6 (six) hours as needed for anxiety.     . ergocalciferol (VITAMIN D2) 50000 units capsule Take 1 capsule by mouth 2 (two) times a week.     . Marland KitchenYDROcodone-acetaminophen (NORCO) 10-325 MG per tablet Take 1-2 tablets by mouth every 6 (six) hours as needed for moderate pain.    . Marland Kitchenactulose (CHRONULAC) 10 GM/15ML solution Take 45 g by mouth daily as needed for mild constipation.     . potassium chloride SA (K-DUR,KLOR-CON) 20 MEQ tablet Take 1 tablet (20 mEq total) by mouth 2 (two) times daily. 10 tablet 0  . promethazine (PHENERGAN) 12.5 MG tablet Take  12.5 mg by mouth every 6 (six) hours as needed for nausea.     . Marland KitchenOPINIRole (REQUIP) 1 MG tablet Take 1-2 mg by mouth at bedtime.     . Marland Kitchenpironolactone (ALDACTONE) 100 MG tablet Take 100 mg by mouth daily.    . traZODone (DESYREL) 50 MG tablet Take 50 mg by mouth at bedtime.    . furosemide (LASIX) 20 MG tablet Take 20-40 mg by mouth 2 (two) times daily. Reported on 07/12/2015     No facility-administered medications prior to visit.     Allergies:   Ondansetron hcl; Penicillins; Aspirin; Midazolam hcl; and Niacin and related   Social History   Social History  . Marital Status: Widowed    Spouse Name: N/A  . Number of Children: N/A  . Years of Education: N/A   Social History Main Topics  . Smoking status: Never Smoker   . Smokeless tobacco:  Never Used  . Alcohol Use: No  . Drug Use: No  . Sexual Activity: No   Other Topics Concern  . None   Social History Narrative     Family History:  The patient's family history includes Anesthesia problems in her daughter; Hyperlipidemia in her brother; Stroke in her father.   ROS:   Please see the history of present illness.    ROS All other systems reviewed and are negative.   PHYSICAL EXAM:   VS:  BP 115/51 mmHg  Pulse 56  Ht 5' 7.5" (1.715 m)  Wt 96.333 kg (212 lb 6 oz)  BMI 32.75 kg/m2   GEN: Well nourished, well developed, in no acute distress HEENT: normal Neck: no JVD, carotid bruits, or masses Cardiac: RRR; no murmurs, rubs, or gallops,no edema  Respiratory:  clear to auscultation bilaterally, normal work of breathing GI: soft, nontender, nondistended, + BS MS: no deformity or atrophy Skin: warm and dry, no rash Neuro:  Alert and Oriented x 3, Strength and sensation are intact Psych: euthymic mood, full affect  Wt Readings from Last 3 Encounters:  07/12/15 96.333 kg (212 lb 6 oz)  04/05/15 97.07 kg (214 lb)  03/28/15 96.163 kg (212 lb)      Studies/Labs Reviewed:   EKG:  EKG is ordered today.  The ekg ordered  today demonstrates competing sinus rhythm and accelerated junctional rhythm, 63 bpm, PRWP.  Recent Labs: 03/28/2015: Hemoglobin 11.9*; Platelets 49*     ASSESSMENT:    1. Syncope, unspecified syncope type   2. Encounter for loop recorder check   3. PSVT (paroxysmal supraventricular tachycardia) ([place])   4. Accelerated junctional rhythm   5. Essential hypertension   6. Cirrhosis, non-alcoholic ([place])      PLAN:  In order of problems listed above:  1. Syncope: no recurrence since device implantation 2. S/P loop recorder: normal device function, no significant brady- or tachy-arrhythmia 3. SVT: none recorded since device implantation. Intolerant of beta blockers due to low BP 4. Acc Junct rhythm:  Incidentally recorded, asymptomatic, not liely of clinical importance. 5. HTN: normal to low BP on spironolactone 6. Cirrhosis:  With portal HTN. Her "syncope" may have been disorientation related to this and use of sedatives and narcotics    Medication Adjustments/Labs and Tests Ordered: Current medicines are reviewed at length with the patient today.  Concerns regarding medicines are outlined above.  Medication changes, Labs and Tests ordered today are listed in the Patient Instructions below. Patient Instructions  Your physician recommends that you continue on your current medications as directed. Please refer to the Current Medication list given to you today.  Dr Sallyanne Kuster recommends that you schedule a follow-up appointment in 1 year. You will receive a reminder letter in the mail two months in advance. If you don't receive a letter, please call our office to schedule the follow-up appointment.  If you need a refill on your cardiac medications before your next appointment, please call your pharmacy.     Mikael Spray, MD  07/12/2015 4:53 PM    [facility] [place], [place], [place]  [address] Phone: [phone]; Fax: [phone]

## 2015-07-12 ENCOUNTER — Encounter: Payer: Self-pay | Admitting: Cardiovascular Disease

## 2015-07-12 ENCOUNTER — Ambulatory Visit (INDEPENDENT_AMBULATORY_CARE_PROVIDER_SITE_OTHER): Payer: Medicare Other | Admitting: Cardiovascular Disease

## 2015-07-12 VITALS — BP 115/51 | HR 56 | Ht 67.5 in | Wt 212.4 lb

## 2015-07-12 DIAGNOSIS — I498 Other specified cardiac arrhythmias: Secondary | ICD-10-CM | POA: Diagnosis not present

## 2015-07-12 DIAGNOSIS — I471 Supraventricular tachycardia: Secondary | ICD-10-CM

## 2015-07-12 DIAGNOSIS — R55 Syncope and collapse: Secondary | ICD-10-CM

## 2015-07-12 DIAGNOSIS — G4733 Obstructive sleep apnea (adult) (pediatric): Secondary | ICD-10-CM

## 2015-07-12 DIAGNOSIS — I341 Nonrheumatic mitral (valve) prolapse: Secondary | ICD-10-CM

## 2015-07-12 DIAGNOSIS — K7460 Unspecified cirrhosis of liver: Secondary | ICD-10-CM

## 2015-07-12 DIAGNOSIS — I10 Essential (primary) hypertension: Secondary | ICD-10-CM

## 2015-07-12 DIAGNOSIS — Z4509 Encounter for adjustment and management of other cardiac device: Secondary | ICD-10-CM

## 2015-07-12 NOTE — Patient Instructions (Signed)
Your physician recommends that you continue on your current medications as directed. Please refer to the Current Medication list given to you today.  Dr Croitoru recommends that you schedule a follow-up appointment in 1 year. You will receive a reminder letter in the mail two months in advance. If you don't receive a letter, please call our office to schedule the follow-up appointment.  If you need a refill on your cardiac medications before your next appointment, please call your pharmacy. 

## 2015-07-23 LAB — CUP PACEART INCLINIC DEVICE CHECK: Date Time Interrogation Session: 20170509133145

## 2015-07-27 ENCOUNTER — Encounter: Payer: Self-pay | Admitting: Cardiovascular Disease

## 2015-07-30 NOTE — Progress Notes (Signed)
Carelink summary report received. Battery status OK. Normal device function. No new symptom episodes, tachy episodes, brady, or pause episodes. No new AF episodes. Monthly summary reports and ROV/PRN 

## 2015-08-03 ENCOUNTER — Ambulatory Visit (INDEPENDENT_AMBULATORY_CARE_PROVIDER_SITE_OTHER): Payer: Medicare Other | Admitting: *Deleted

## 2015-08-03 DIAGNOSIS — R55 Syncope and collapse: Secondary | ICD-10-CM

## 2015-08-04 NOTE — Progress Notes (Signed)
Carelink Summary Report / Loop Recorder 

## 2015-08-05 ENCOUNTER — Encounter: Payer: Self-pay | Admitting: Cardiovascular Disease

## 2015-08-12 LAB — CUP PACEART REMOTE DEVICE CHECK: Date Time Interrogation Session: 20170501193945

## 2015-09-02 ENCOUNTER — Ambulatory Visit (INDEPENDENT_AMBULATORY_CARE_PROVIDER_SITE_OTHER): Payer: Medicare Other | Admitting: *Deleted

## 2015-09-02 DIAGNOSIS — R55 Syncope and collapse: Secondary | ICD-10-CM

## 2015-09-05 NOTE — Progress Notes (Signed)
Carelink Summary Report / Loop Recorder 

## 2015-09-14 LAB — CUP PACEART REMOTE DEVICE CHECK: MDC IDC SESS DTM: 20170531193735

## 2015-09-22 LAB — CUP PACEART REMOTE DEVICE CHECK: MDC IDC SESS DTM: 20170630200646

## 2015-10-03 ENCOUNTER — Ambulatory Visit (INDEPENDENT_AMBULATORY_CARE_PROVIDER_SITE_OTHER): Payer: Medicare Other | Admitting: *Deleted

## 2015-10-03 DIAGNOSIS — R55 Syncope and collapse: Secondary | ICD-10-CM

## 2015-10-03 NOTE — Progress Notes (Signed)
Carelink Summary Report / Loop Recorder 

## 2015-10-07 LAB — CUP PACEART REMOTE DEVICE CHECK: MDC IDC SESS DTM: 20170730210556

## 2015-10-18 ENCOUNTER — Other Ambulatory Visit: Payer: Self-pay | Admitting: Oncology

## 2015-10-18 ENCOUNTER — Other Ambulatory Visit: Payer: Self-pay | Admitting: Endocrinology

## 2015-10-18 DIAGNOSIS — Z1231 Encounter for screening mammogram for malignant neoplasm of breast: Secondary | ICD-10-CM

## 2015-10-18 DIAGNOSIS — Z9889 Other specified postprocedural states: Secondary | ICD-10-CM

## 2015-10-18 DIAGNOSIS — Z853 Personal history of malignant neoplasm of breast: Secondary | ICD-10-CM

## 2015-11-01 ENCOUNTER — Ambulatory Visit: Payer: Medicare Other

## 2015-11-01 ENCOUNTER — Ambulatory Visit (INDEPENDENT_AMBULATORY_CARE_PROVIDER_SITE_OTHER): Payer: Medicare Other | Admitting: *Deleted

## 2015-11-01 DIAGNOSIS — R55 Syncope and collapse: Secondary | ICD-10-CM | POA: Diagnosis not present

## 2015-11-02 NOTE — Progress Notes (Signed)
Carelink Summary Report / Loop Recorder 

## 2015-11-07 ENCOUNTER — Inpatient Hospital Stay (HOSPITAL_COMMUNITY): Payer: Medicare Other

## 2015-11-07 ENCOUNTER — Emergency Department (HOSPITAL_COMMUNITY): Payer: Medicare Other

## 2015-11-07 ENCOUNTER — Encounter (HOSPITAL_COMMUNITY): Payer: Self-pay

## 2015-11-07 ENCOUNTER — Inpatient Hospital Stay (HOSPITAL_COMMUNITY)
Admission: EM | Admit: 2015-11-07 | Discharge: 2015-11-13 | DRG: 872 | Disposition: A | Discharge status: Skilled Nursing Facility | Payer: Medicare Other | Attending: Internal Medicine | Admitting: Internal Medicine

## 2015-11-07 DIAGNOSIS — R7989 Other specified abnormal findings of blood chemistry: Secondary | ICD-10-CM | POA: Diagnosis not present

## 2015-11-07 DIAGNOSIS — Z9049 Acquired absence of other specified parts of digestive tract: Secondary | ICD-10-CM | POA: Diagnosis not present

## 2015-11-07 DIAGNOSIS — R197 Diarrhea, unspecified: Secondary | ICD-10-CM | POA: Diagnosis present

## 2015-11-07 DIAGNOSIS — K766 Portal hypertension: Secondary | ICD-10-CM | POA: Diagnosis present

## 2015-11-07 DIAGNOSIS — Z9181 History of falling: Secondary | ICD-10-CM | POA: Diagnosis not present

## 2015-11-07 DIAGNOSIS — M6282 Rhabdomyolysis: Secondary | ICD-10-CM | POA: Diagnosis present

## 2015-11-07 DIAGNOSIS — G4733 Obstructive sleep apnea (adult) (pediatric): Secondary | ICD-10-CM | POA: Diagnosis present

## 2015-11-07 DIAGNOSIS — K7460 Unspecified cirrhosis of liver: Secondary | ICD-10-CM | POA: Diagnosis present

## 2015-11-07 DIAGNOSIS — Z853 Personal history of malignant neoplasm of breast: Secondary | ICD-10-CM | POA: Diagnosis not present

## 2015-11-07 DIAGNOSIS — A419 Sepsis, unspecified organism: Principal | ICD-10-CM | POA: Diagnosis present

## 2015-11-07 DIAGNOSIS — E8770 Fluid overload, unspecified: Secondary | ICD-10-CM | POA: Diagnosis present

## 2015-11-07 DIAGNOSIS — I248 Other forms of acute ischemic heart disease: Secondary | ICD-10-CM | POA: Diagnosis present

## 2015-11-07 DIAGNOSIS — Z823 Family history of stroke: Secondary | ICD-10-CM | POA: Diagnosis not present

## 2015-11-07 DIAGNOSIS — R600 Localized edema: Secondary | ICD-10-CM | POA: Diagnosis present

## 2015-11-07 DIAGNOSIS — D649 Anemia, unspecified: Secondary | ICD-10-CM | POA: Diagnosis present

## 2015-11-07 DIAGNOSIS — G2581 Restless legs syndrome: Secondary | ICD-10-CM | POA: Diagnosis present

## 2015-11-07 DIAGNOSIS — K7210 Chronic hepatic failure without coma: Secondary | ICD-10-CM | POA: Diagnosis present

## 2015-11-07 DIAGNOSIS — I878 Other specified disorders of veins: Secondary | ICD-10-CM

## 2015-11-07 DIAGNOSIS — R17 Unspecified jaundice: Secondary | ICD-10-CM

## 2015-11-07 DIAGNOSIS — D696 Thrombocytopenia, unspecified: Secondary | ICD-10-CM | POA: Diagnosis present

## 2015-11-07 DIAGNOSIS — E1122 Type 2 diabetes mellitus with diabetic chronic kidney disease: Secondary | ICD-10-CM | POA: Diagnosis present

## 2015-11-07 DIAGNOSIS — E876 Hypokalemia: Secondary | ICD-10-CM | POA: Diagnosis present

## 2015-11-07 DIAGNOSIS — N179 Acute kidney failure, unspecified: Secondary | ICD-10-CM

## 2015-11-07 DIAGNOSIS — E872 Acidosis: Secondary | ICD-10-CM | POA: Diagnosis present

## 2015-11-07 DIAGNOSIS — T473X5A Adverse effect of saline and osmotic laxatives, initial encounter: Secondary | ICD-10-CM | POA: Diagnosis present

## 2015-11-07 DIAGNOSIS — Z79899 Other long term (current) drug therapy: Secondary | ICD-10-CM | POA: Diagnosis not present

## 2015-11-07 DIAGNOSIS — L03115 Cellulitis of right lower limb: Secondary | ICD-10-CM | POA: Diagnosis present

## 2015-11-07 DIAGNOSIS — R6521 Severe sepsis with septic shock: Secondary | ICD-10-CM

## 2015-11-07 DIAGNOSIS — R945 Abnormal results of liver function studies: Secondary | ICD-10-CM

## 2015-11-07 DIAGNOSIS — R531 Weakness: Secondary | ICD-10-CM

## 2015-11-07 DIAGNOSIS — W19XXXA Unspecified fall, initial encounter: Secondary | ICD-10-CM

## 2015-11-07 DIAGNOSIS — D689 Coagulation defect, unspecified: Secondary | ICD-10-CM | POA: Diagnosis present

## 2015-11-07 DIAGNOSIS — R609 Edema, unspecified: Secondary | ICD-10-CM | POA: Diagnosis not present

## 2015-11-07 DIAGNOSIS — I10 Essential (primary) hypertension: Secondary | ICD-10-CM | POA: Diagnosis not present

## 2015-11-07 LAB — CBC WITH DIFFERENTIAL/PLATELET
Basophils Absolute: 0 10*3/uL (ref 0.0–0.1)
Basophils Relative: 0 %
EOS ABS: 0 10*3/uL (ref 0.0–0.7)
EOS PCT: 0 %
HCT: 36.7 % (ref 36.0–46.0)
Hemoglobin: 12.8 g/dL (ref 12.0–15.0)
LYMPHS ABS: 0.3 10*3/uL — AB (ref 0.7–4.0)
LYMPHS PCT: 4 %
MCH: 33.9 pg (ref 26.0–34.0)
MCHC: 34.9 g/dL (ref 30.0–36.0)
MCV: 97.1 fL (ref 78.0–100.0)
MONO ABS: 0.5 10*3/uL (ref 0.1–1.0)
Monocytes Relative: 6 %
Neutro Abs: 7.2 10*3/uL (ref 1.7–7.7)
Neutrophils Relative %: 90 %
PLATELETS: 34 10*3/uL — AB (ref 150–400)
RBC: 3.78 MIL/uL — ABNORMAL LOW (ref 3.87–5.11)
RDW: 17.3 % — AB (ref 11.5–15.5)
WBC: 7.9 10*3/uL (ref 4.0–10.5)

## 2015-11-07 LAB — I-STAT BETA HCG BLOOD, ED (MC, WL, AP ONLY)

## 2015-11-07 LAB — I-STAT CG4 LACTIC ACID, ED
LACTIC ACID, VENOUS: 3.2 mmol/L — AB (ref 0.5–1.9)
Lactic Acid, Venous: 4.16 mmol/L (ref 0.5–1.9)

## 2015-11-07 LAB — URINE MICROSCOPIC-ADD ON

## 2015-11-07 LAB — COMPREHENSIVE METABOLIC PANEL
ALT: 37 U/L (ref 14–54)
AST: 64 U/L — AB (ref 15–41)
Albumin: 2.9 g/dL — ABNORMAL LOW (ref 3.5–5.0)
Alkaline Phosphatase: 159 U/L — ABNORMAL HIGH (ref 38–126)
Anion gap: 10 (ref 5–15)
BILIRUBIN TOTAL: 8.2 mg/dL — AB (ref 0.3–1.2)
BUN: 26 mg/dL — AB (ref 6–20)
CHLORIDE: 104 mmol/L (ref 101–111)
CO2: 25 mmol/L (ref 22–32)
CREATININE: 1.15 mg/dL — AB (ref 0.44–1.00)
Calcium: 8.6 mg/dL — ABNORMAL LOW (ref 8.9–10.3)
GFR, EST AFRICAN AMERICAN: 56 mL/min — AB (ref >60)
GFR, EST NON AFRICAN AMERICAN: 48 mL/min — AB (ref >60)
Glucose, Bld: 91 mg/dL (ref 65–99)
POTASSIUM: 3.2 mmol/L — AB (ref 3.5–5.1)
Sodium: 139 mmol/L (ref 135–145)
TOTAL PROTEIN: 6.3 g/dL — AB (ref 6.5–8.1)

## 2015-11-07 LAB — LACTIC ACID, PLASMA: LACTIC ACID, VENOUS: 2.9 mmol/L — AB (ref 0.5–1.9)

## 2015-11-07 LAB — URINALYSIS, ROUTINE W REFLEX MICROSCOPIC
GLUCOSE, UA: NEGATIVE mg/dL
KETONES UR: NEGATIVE mg/dL
Nitrite: POSITIVE — AB
PROTEIN: NEGATIVE mg/dL
Specific Gravity, Urine: 1.028 (ref 1.005–1.030)
pH: 5 (ref 5.0–8.0)

## 2015-11-07 LAB — CBC AND DIFFERENTIAL
HEMATOCRIT: 37 % (ref 36–46)
HEMOGLOBIN: 12.8 g/dL (ref 12.0–16.0)
Platelets: 34 10*3/uL — AB (ref 150–399)
WBC: 7.9 10^3/mL

## 2015-11-07 LAB — BLOOD GAS, VENOUS
Acid-Base Excess: 2.6 mmol/L — ABNORMAL HIGH (ref 0.0–2.0)
Bicarbonate: 23.6 mmol/L (ref 20.0–28.0)
FIO2: 0.21
O2 SAT: 86.9 %
PATIENT TEMPERATURE: 98.6
PCO2 VEN: 26.7 mmHg — AB (ref 44.0–60.0)
pH, Ven: 7.556 — ABNORMAL HIGH (ref 7.250–7.430)
pO2, Ven: 48 mmHg — ABNORMAL HIGH (ref 32.0–45.0)

## 2015-11-07 LAB — AMMONIA: Ammonia: 34 umol/L (ref 9–35)

## 2015-11-07 LAB — PROTIME-INR
INR: 2
Prothrombin Time: 22.9 seconds — ABNORMAL HIGH (ref 11.4–15.2)

## 2015-11-07 LAB — HEPATIC FUNCTION PANEL
ALT: 37 U/L — AB (ref 7–35)
AST: 64 U/L — AB (ref 13–35)
Alkaline Phosphatase: 159 U/L — AB (ref 25–125)
Bilirubin, Total: 8.2 mg/dL

## 2015-11-07 LAB — POCT INR: INR: 2 — AB (ref 0.9–1.1)

## 2015-11-07 MED ORDER — LACTULOSE 10 GM/15ML PO SOLN
45.0000 g | Freq: Every day | ORAL | Status: DC | PRN
  Filled 2015-11-07: qty 90

## 2015-11-07 MED ORDER — SODIUM CHLORIDE 0.9 % IV BOLUS (SEPSIS)
3000.0000 mL | Freq: Once | INTRAVENOUS | Status: AC
  Administered 2015-11-07: 3000 mL via INTRAVENOUS

## 2015-11-07 MED ORDER — OXYCODONE HCL 5 MG PO TABS
5.0000 mg | ORAL_TABLET | Freq: Once | ORAL | Status: AC
  Administered 2015-11-07: 5 mg via ORAL
  Filled 2015-11-07: qty 1

## 2015-11-07 MED ORDER — DEXTROSE 5 % IV SOLN: 2.0000 g | Freq: Once | INTRAVENOUS | Status: DC

## 2015-11-07 MED ORDER — ACETAMINOPHEN 650 MG RE SUPP: 650.0000 mg | Freq: Four times a day (QID) | RECTAL | Status: DC | PRN

## 2015-11-07 MED ORDER — SODIUM CHLORIDE 0.9 % IV SOLN
INTRAVENOUS | Status: DC
  Administered 2015-11-07: 23:00:00 via INTRAVENOUS

## 2015-11-07 MED ORDER — METRONIDAZOLE IN NACL 5-0.79 MG/ML-% IV SOLN: 500.0000 mg | Freq: Three times a day (TID) | INTRAVENOUS | Status: DC

## 2015-11-07 MED ORDER — DEXTROSE 5 % IV SOLN
1.0000 g | Freq: Three times a day (TID) | INTRAVENOUS | Status: DC
  Administered 2015-11-08 (×2): 1 g via INTRAVENOUS
  Filled 2015-11-07 (×3): qty 1

## 2015-11-07 MED ORDER — DEXTROSE 5 % IV SOLN
2.0000 g | Freq: Once | INTRAVENOUS | Status: AC
  Administered 2015-11-07: 2 g via INTRAVENOUS
  Filled 2015-11-07: qty 2

## 2015-11-07 MED ORDER — VANCOMYCIN HCL IN DEXTROSE 750-5 MG/150ML-% IV SOLN
750.0000 mg | Freq: Two times a day (BID) | INTRAVENOUS | Status: DC
  Administered 2015-11-08 – 2015-11-09 (×4): 750 mg via INTRAVENOUS
  Filled 2015-11-07 (×5): qty 150

## 2015-11-07 MED ORDER — VANCOMYCIN HCL IN DEXTROSE 1-5 GM/200ML-% IV SOLN
1000.0000 mg | Freq: Once | INTRAVENOUS | Status: DC
  Filled 2015-11-07: qty 200

## 2015-11-07 MED ORDER — LEVOFLOXACIN IN D5W 750 MG/150ML IV SOLN
750.0000 mg | Freq: Once | INTRAVENOUS | Status: AC
  Administered 2015-11-07: 750 mg via INTRAVENOUS
  Filled 2015-11-07: qty 150

## 2015-11-07 MED ORDER — VANCOMYCIN HCL IN DEXTROSE 1-5 GM/200ML-% IV SOLN: 1000.0000 mg | Freq: Once | INTRAVENOUS | Status: DC

## 2015-11-07 MED ORDER — LEVOFLOXACIN IN D5W 750 MG/150ML IV SOLN
750.0000 mg | INTRAVENOUS | Status: DC
  Administered 2015-11-08 – 2015-11-09 (×3): 750 mg via INTRAVENOUS
  Filled 2015-11-07 (×2): qty 150

## 2015-11-07 MED ORDER — SODIUM CHLORIDE 0.9 % IV SOLN
1500.0000 mg | INTRAVENOUS | Status: AC
  Administered 2015-11-07: 1500 mg via INTRAVENOUS
  Filled 2015-11-07: qty 1500

## 2015-11-07 MED ORDER — ACETAMINOPHEN 325 MG PO TABS
650.0000 mg | ORAL_TABLET | Freq: Four times a day (QID) | ORAL | Status: DC | PRN
  Administered 2015-11-08 – 2015-11-12 (×7): 650 mg via ORAL
  Filled 2015-11-07 (×7): qty 2

## 2015-11-07 MED ORDER — METRONIDAZOLE IN NACL 5-0.79 MG/ML-% IV SOLN
500.0000 mg | Freq: Once | INTRAVENOUS | Status: AC
  Administered 2015-11-07: 500 mg via INTRAVENOUS
  Filled 2015-11-07: qty 100

## 2015-11-07 MED ORDER — ACETAMINOPHEN 325 MG PO TABS
650.0000 mg | ORAL_TABLET | Freq: Once | ORAL | Status: AC
  Administered 2015-11-07: 650 mg via ORAL
  Filled 2015-11-07: qty 2

## 2015-11-07 NOTE — H&P (Signed)
History and Physical    ARELIZ ROTHMAN FBP:[id number] DOB: [birth year] DOA: 11/07/2015  PCP: Sheela Stack, MD  Patient coming from: Home.  Chief Complaint: Weakness.  HPI: [name] is a 70 y.o. female with nonalcoholic liver cirrhosis, history of PSVT, chronic thrombocytopenia was brought to the ER after patient was found to be getting weak over the last few days. As per patient's daughter patient was doing fine 3 days ago but got weak since then. Patient also found to be lethargic. Patient denies any chest pain shortness of breath nausea vomiting or diarrhea. Has had some nonspecific abdominal discomfort around the epigastric area. Abdomen appears nontender on exam. Patient also has redness and swelling in the right lower extremity concerning for cellulitis. Patient is febrile with lactate elevated and patient looking drowsy and confused. Patient bilirubin also has increased from baseline but ammonia levels are normal. Has had a recent fall 2 weeks ago. CT head and x-ray pelvis are pending. Patient does have some pain in the right lower extremity on moving. Patient is being admitted for sepsis most likely source would be right leg cellulitis.   ED Course: Patient had fluid bolus for sepsis. Patient was started on empiric antibiotics for sepsis.  Review of Systems: As per HPI, rest all negative.   Past Medical History:  Diagnosis Date  . Anxiety   . Asthma   . Blood dyscrasia    low platelets ... low wbc.  ? liver  disease.   . Cancer [facility]) 2005   left breast  . Chronic kidney disease 50's   nephritis--none at present *12/03/2011)  . Cirrhosis ([place]) 2.5 yrs   never a drinker.  platelets  low... wbc also low per pt ..   . Complication of anesthesia    had difficulty going to sleep, staying asleep during surgery and was combative after procedure.  . Depression   . Diabetes mellitus    no meds  . Family history of anesthesia complication    Daughter has complications with  Versed  . H/O hiatal hernia   . Headache(784.0)   . Heart murmur   . Hypertension   . Platelets decreased ([place])    ??   D/T CIRRHOISIS  . Portal hypertension ([place])   . PSVT (paroxysmal supraventricular tachycardia) ([place])   . Restless leg   . Shortness of breath    with exertion   . Sleep apnea 2013   sleep study at [facility]    uses cpap    Past Surgical History:  Procedure Laterality Date  . ABDOMINAL HYSTERECTOMY    . APPENDECTOMY    . BREAST SURGERY     left lumpectomy--few nodes removed  . CARDIAC CATHETERIZATION  1975   normal cath  -low potassium  . CHOLECYSTECTOMY    . COLONOSCOPY W/ BIOPSIES AND POLYPECTOMY    . COLONOSCOPY WITH PROPOFOL N/A 10/21/2013   Procedure: COLONOSCOPY WITH PROPOFOL;  Surgeon: [name], MD;  Location: [facility] ENDOSCOPY;  Service: Endoscopy;  Laterality: N/A;  ultra slim scope   . EP IMPLANTABLE DEVICE N/A 04/05/2015   Procedure: Loop Recorder Insertion;  Surgeon: Sanda Klein, MD;  Location: [place] CV LAB;  Service: Cardiovascular;  Laterality: N/A;  . ESOPHAGOGASTRODUODENOSCOPY (EGD) WITH PROPOFOL N/A 10/21/2013   Procedure: ESOPHAGOGASTRODUODENOSCOPY (EGD) WITH PROPOFOL;  Surgeon: [name], MD;  Location: [facility] ENDOSCOPY;  Service: Endoscopy;  Laterality: N/A;  . MASTECTOMY    . NM MYOCAR PERF WALL MOTION  08/17/2008   mild  to mod. superimposed ischemiamid antereoseptal,apical anterior & apical regions  . ORIF HUMERUS FRACTURE  06/19/2011   Procedure: OPEN REDUCTION INTERNAL FIXATION (ORIF) DISTAL HUMERUS FRACTURE;  Surgeon: [name], MD;  Location: [place];  Service: Orthopedics;  Laterality: Left;  . ORIF HUMERUS FRACTURE  12/11/2011   Procedure: OPEN REDUCTION INTERNAL FIXATION (ORIF) HUMERAL SHAFT FRACTURE;  Surgeon: [name], MD;  Location: [place];  Service: Orthopedics;  Laterality: Left;   Removal of Hardware left humerus, Open Reduction Internal Fixation Left Humeral Shaft Fracture Non-Union  . TONSILLECTOMY      . US ECHOCARDIOGRAPHY  03/16/2011   mild concentric LVH,mod LAE,stage I diastolic dysfunction,MAC,mild MR,aortic sclerosis,mild TR     reports that she has never smoked. She has never used smokeless tobacco. She reports that she does not drink alcohol or use drugs.  Allergies  Allergen Reactions  . Ondansetron Hcl Other (See Comments)    Other Reaction: High B/P  . Penicillins Anaphylaxis, Itching and Swelling    Has patient had a PCN reaction causing immediate rash, facial/tongue/throat swelling, SOB or lightheadedness with hypotension: Yes Has patient had a PCN reaction causing severe rash involving mucus membranes or skin necrosis: No Has patient had a PCN reaction that required hospitalization No Has patient had a PCN reaction occurring within the last 10 years: No If all of the above answers are "NO", then may proceed with Cephalosporin use.   . Aspirin     Has liver disease  . Midazolam Hcl Other (See Comments)    Daughter has allergy, doctors advised to warn against using.Marland KitchenMarland KitchenPropofol is okay to use.  . Niacin And Related Itching    Feels like skin is on fire    Family History  Problem Relation Age of Onset  . Stroke Father   . Anesthesia problems Daughter   . Hyperlipidemia Brother     Prior to Admission medications   Medication Sig Start Date End Date Taking? Authorizing Provider  ALPRAZolam Duanne Moron) 0.25 MG tablet Take 1 tablet by mouth 2 (two) times daily as needed for anxiety.  12/30/14  Yes Historical Provider, MD  buPROPion (WELLBUTRIN) 75 MG tablet Take 75-150 mg by mouth 2 (two) times daily. 70m in the morning, 1560min the evening   Yes Historical Provider, MD  ergocalciferol (VITAMIN D2) 50000 units capsule Take 1 capsule by mouth 2 (two) times a week.    Yes Historical Provider, MD  furosemide (LASIX) 40 MG tablet Take by mouth daily. Take 1 (4075mtab in the morning and half tab (38m60mn the evening   Yes Historical Provider, MD  HYDROcodone-acetaminophen  (NORCO) 10-325 MG per tablet Take 1-2 tablets by mouth every 6 (six) hours as needed for moderate pain.   Yes Historical Provider, MD  potassium chloride SA (K-DUR,KLOR-CON) 20 MEQ tablet Take 1 tablet (20 mEq total) by mouth 2 (two) times daily. 06/29/14  Yes John Molpus, MD  rOPINIRole (REQUIP) 1 MG tablet Take 2 mg by mouth at bedtime.    Yes Historical Provider, MD  spironolactone (ALDACTONE) 100 MG tablet Take 100 mg by mouth daily. 06/30/13  Yes Historical Provider, MD  traZODone (DESYREL) 50 MG tablet Take 50 mg by mouth at bedtime.   Yes Historical Provider, MD  zolpidem (AMBIEN) 5 MG tablet TK 1/2 T PO HS FOR INSOMNIA 07/06/15  Yes Historical Provider, MD  diazepam (VALIUM) 5 MG tablet Take 5 mg by mouth every 6 (six) hours as needed for anxiety.  Historical Provider, MD  lactulose (CHRONULAC) 10 GM/15ML solution Take 45 g by mouth daily as needed for mild constipation.     Historical Provider, MD  promethazine (PHENERGAN) 12.5 MG tablet Take 12.5 mg by mouth every 6 (six) hours as needed for nausea.     Historical Provider, MD    Physical Exam: Vitals:   11/07/15 2030 11/07/15 2100 11/07/15 2130 11/07/15 2205  BP: (!) 113/48 (!) 125/48 (!) 115/45 (!) 109/45  Pulse: 95 99 102 95  Resp: 21 (!) 30 14 (!) 27  Temp:    (!) 103.2 F (39.6 C)  TempSrc:    Rectal  SpO2: 96% 97% 96% 96%  Weight:      Height:          Constitutional: Appears mildly drowsy. Vitals:   11/07/15 2030 11/07/15 2100 11/07/15 2130 11/07/15 2205  BP: (!) 113/48 (!) 125/48 (!) 115/45 (!) 109/45  Pulse: 95 99 102 95  Resp: 21 (!) 30 14 (!) 27  Temp:    (!) 103.2 F (39.6 C)  TempSrc:    Rectal  SpO2: 96% 97% 96% 96%  Weight:      Height:       Eyes: Mild icterus no pallor. ENMT: No discharge from the ears eyes nose or mouth. Neck: No mass felt. No neck rigidity. Respiratory: No rhonchi or crepitations. Cardiovascular: S1-S2 heard. Abdomen: Soft nontender bowel sounds present. No guarding or  rigidity. Musculoskeletal: Bilateral lower extremity edema with redness of the right lower extremity. Skin: Rash on the right lower extremity. Neurologic: Mildly drowsy but follows commands and moves all extremities. Perla positive. No facial asymmetry. Psychiatric: Appears drowsy.   Labs on Admission: I have personally reviewed following labs and imaging studies  CBC:  Recent Labs Lab 11/07/15 1910  WBC 7.9  NEUTROABS 7.2  HGB 12.8  HCT 36.7  MCV 97.1  PLT 34*   Basic Metabolic Panel:  Recent Labs Lab 11/07/15 1910  NA 139  K 3.2*  CL 104  CO2 25  GLUCOSE 91  BUN 26*  CREATININE 1.15*  CALCIUM 8.6*   GFR: Estimated Creatinine Clearance: 57 mL/min (by C-G formula based on SCr of 1.15 mg/dL). Liver Function Tests:  Recent Labs Lab 11/07/15 1910  AST 64*  ALT 37  ALKPHOS 159*  BILITOT 8.2*  PROT 6.3*  ALBUMIN 2.9*   No results for input(s): LIPASE, AMYLASE in the last 168 hours.  Recent Labs Lab 11/07/15 1940  AMMONIA 34   Coagulation Profile:  Recent Labs Lab 11/07/15 1940  INR 2.00   Cardiac Enzymes: No results for input(s): CKTOTAL, CKMB, CKMBINDEX, TROPONINI in the last 168 hours. BNP (last 3 results) No results for input(s): PROBNP in the last 8760 hours. HbA1C: No results for input(s): HGBA1C in the last 72 hours. CBG: No results for input(s): GLUCAP in the last 168 hours. Lipid Profile: No results for input(s): CHOL, HDL, LDLCALC, TRIG, CHOLHDL, LDLDIRECT in the last 72 hours. Thyroid Function Tests: No results for input(s): TSH, T4TOTAL, FREET4, T3FREE, THYROIDAB in the last 72 hours. Anemia Panel: No results for input(s): VITAMINB12, FOLATE, FERRITIN, TIBC, IRON, RETICCTPCT in the last 72 hours. Urine analysis:    Component Value Date/Time   COLORURINE ORANGE (A) 11/07/2015 2202   APPEARANCEUR CLOUDY (A) 11/07/2015 2202   LABSPEC 1.028 11/07/2015 2202   PHURINE 5.0 11/07/2015 2202   GLUCOSEU NEGATIVE 11/07/2015 2202   HGBUR  SMALL (A) 11/07/2015 2202   BILIRUBINUR SMALL (A) 11/07/2015 2202  KETONESUR NEGATIVE 11/07/2015 2202   PROTEINUR NEGATIVE 11/07/2015 2202   UROBILINOGEN 1.0 04/18/2012 1030   NITRITE POSITIVE (A) 11/07/2015 2202   LEUKOCYTESUR SMALL (A) 11/07/2015 2202   Sepsis Labs: _0 (procalcitonin:4,lacticidven:4) )No results found for this or any previous visit (from the past 240 hour(s)).   Radiological Exams on Admission: Dg Chest 2 View  Result Date: 11/07/2015 CLINICAL DATA:  70 year old female with weakness, fever, and headache EXAM: CHEST  2 VIEW COMPARISON:  Chest radiograph dated 06/14/2013 FINDINGS: Two views of the chest do not demonstrate a focal consolidation. There is no pleural effusion or pneumothorax. Stable normal cardiac silhouette. No acute osseous pathology identified. Partially visualized left proximal humeral fixation plate and screws. Multiple surgical clips noted in the left breast area. IMPRESSION: No active cardiopulmonary disease. Electronically Signed   By: Anner Crete M.D.   On: 11/07/2015 19:30    Assessment/Plan Principal Problem:   Sepsis ([place]) Active Problems:   Thrombocytopenia, unspecified (HCC)   Cirrhosis, non-alcoholic (HCC)   HTN (hypertension)   Cellulitis of right lower extremity    1. Sepsis most likely source could be right lower extremity - since patient also had some epigastric discomfort I have ordered a sonogram of the abdomen primarily to look for portal vein thrombosis and also to look for any other liver pathology. Check Dopplers of the lower extremities rule out DVT. For now patient is placed on empiric antibiotics for sepsis. Follow blood cultures, procalcitonin levels lactate levels and continue hydration and hold diuretics and closely follow respiratory status. 2. Lethargy and weakness - hold pain medications and sedatives for now. Patient is on multiple medications for this. Will cycle cardiac markers check CK levels. Venous blood  gas last blood gas does not show any carbon dioxide retention. Continue lactulose and follow ammonia levels. CT head is pending. 3. Nonalcoholic liver cirrhosis with worsening LFTs - holding diuretics for now since patient is receiving hydration. Closely follow respiratory status. Closely follow LFTs. We'll check LDH for any hemolysis. 4. Chronic thrombocytopenia probably from liver cirrhosis and splenomegaly - notes state that patient has had bone marrow biopsy for this. Follow CBC closely. 5. Chronic anemia - closely follow CBC. 6. Elevated creatinine probably from chronic disease or acute renal failure - charts mention patient has chronic kidney disease. Creatinine has mildly worsened from recent. Closely follow metabolic panel. 7. History of PSVT - follow closely for any arrhythmias. 8. History of OSA - CPAP.  CT of the head, x-ray pelvis, sonogram of the abdomen, EKG are pending.   DVT prophylaxis: SCDs. Code Status: Full code.  Family Communication: Patient's daughter.  Disposition Plan: Home.  Consults called: None.  Admission status: Inpatient. Stepdown.    Rise Patience MD [facility] Pager [phone].  If 7PM-7AM, please contact night-coverage [email or web address] Password [email or web address]  11/07/2015, 10:51 PM

## 2015-11-07 NOTE — Progress Notes (Addendum)
Pharmacy Antibiotic Note  [name] is a 70 y.o. female admitted on 11/07/2015 with weakness, fever, and headache.  Pharmacy has been consulted for Vancomycin, Aztreonam, and Flagyl dosing for sepsis. First doses ordered in ED.  Noted allergy to PCNs (anaphylaxis, itching, swelling).    Plan: CMET in process.  Will wait for renal function to order further doses.       Temp (24hrs), Avg:100.9 F (38.3 C), Min:99.8 F (37.7 C), Max:101.9 F (38.8 C)   Recent Labs Lab 11/07/15 1918  LATICACIDVEN 4.16*    CrCl cannot be calculated (Unknown ideal weight.).    Allergies  Allergen Reactions  . Ondansetron Hcl Other (See Comments)    Other Reaction: High B/P  . Penicillins Anaphylaxis, Itching and Swelling    Has patient had a PCN reaction causing immediate rash, facial/tongue/throat swelling, SOB or lightheadedness with hypotension: Yes Has patient had a PCN reaction causing severe rash involving mucus membranes or skin necrosis: No Has patient had a PCN reaction that required hospitalization No Has patient had a PCN reaction occurring within the last 10 years: No If all of the above answers are "NO", then may proceed with Cephalosporin use.   . Aspirin     Has liver disease  . Midazolam Hcl Other (See Comments)    Daughter has allergy, doctors advised to warn against using.Marland KitchenMarland KitchenPropofol is okay to use.  . Niacin And Related Itching    Feels like skin is on fire    Antimicrobials this admission: 9/4 Vancomycin >>  9/4 Aztreonam >>  9/4 Flagyl >>  Dose adjustments this admission:  Microbiology results: 9/4 BCx: collected 9/4 UCx: ordered   Thank you for allowing pharmacy to be a part of this patient's care.  Ralene Bathe, PharmD, BCPS 11/07/2015, 7:41 PM  Pager: [phone]  Addendum:  SCr = 1.15, CrCl ~57 ml/min (N53) Vancomycin 1500g IV x1, then 750mg  IV q12h Aztreonam 2g IV x1, then 1g IV q8h Flagyl 500mg  IV q8h F/u renal function, cultures, clinical  course  Ralene Bathe, PharmD, BCPS 11/07/2015, 8:08 PM  Pager: [phone]

## 2015-11-07 NOTE — ED Notes (Signed)
Daughter Lennox Solders: [id number] Home: [id number]

## 2015-11-07 NOTE — ED Provider Notes (Addendum)
[facility] DEPT [facility] Provider Note   CSN: [id number] Arrival date & time: 11/07/15  1833     History   Chief Complaint Chief Complaint  Patient presents with  . Weakness  . Fever    HPI [name] is a 70 y.o. female.  HPI  Pt comes in with cc of fevers, weakness. Pt has hx of liver cirrhosis, breast CA in remission, HTN, borderline DM. Pt reports that she has been ill since the weekend began. Pt has leg pain and swelling in her RLE. She denies severe headaches, neck pain, abd pain, chest pain, cough, dib, uti like symptoms.   Past Medical History:  Diagnosis Date  . Anxiety   . Asthma   . Blood dyscrasia    low platelets ... low wbc.  ? liver  disease.   . Cancer [facility]) 2005   left breast  . Chronic kidney disease 50's   nephritis--none at present *12/03/2011)  . Cirrhosis ([place]) 2.5 yrs   never a drinker.  platelets  low... wbc also low per pt ..   . Complication of anesthesia    had difficulty going to sleep, staying asleep during surgery and was combative after procedure.  . Depression   . Diabetes mellitus    no meds  . Family history of anesthesia complication    Daughter has complications with Versed  . H/O hiatal hernia   . Headache(784.0)   . Heart murmur   . Hypertension   . Platelets decreased ([place])    ??   D/T CIRRHOISIS  . Portal hypertension ([place])   . PSVT (paroxysmal supraventricular tachycardia) ([place])   . Restless leg   . Shortness of breath    with exertion   . Sleep apnea 2013   sleep study at [facility]    uses cpap    Patient Active Problem List   Diagnosis Date Noted  . Sepsis ([place]) 11/07/2015  . Cellulitis of right lower extremity 11/07/2015  . Elevated LFTs   . Accelerated junctional rhythm 07/12/2015  . Encounter for loop recorder check 07/11/2015  . Syncope 03/28/2015  . Generalized weakness 07/09/2013  . Fall at home 07/09/2013  . S/P tooth extraction 07/09/2013  . Weakness 07/09/2013  . OSA (obstructive sleep apnea)  11/01/2012  . MVP (mitral valve prolapse) 11/01/2012  . PSVT (paroxysmal supraventricular tachycardia) ([place]) 11/01/2012  . Cirrhosis, non-alcoholic ([place]) [id number]  . HTN (hypertension) 11/01/2012  . Thrombocytopenia, unspecified ([place]) 12/14/2011  . Acute blood loss anemia 12/14/2011  . 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 ABDOMINAL HYSTERECTOMY    . APPENDECTOMY    . BREAST SURGERY     left lumpectomy--few nodes removed  . CARDIAC CATHETERIZATION  1975   normal cath  -low potassium  . CHOLECYSTECTOMY    . COLONOSCOPY W/ BIOPSIES AND POLYPECTOMY    . COLONOSCOPY WITH PROPOFOL N/A 10/21/2013   Procedure: COLONOSCOPY WITH PROPOFOL;  Surgeon: [name], MD;  Location: [facility] ENDOSCOPY;  Service: Endoscopy;  Laterality: N/A;  ultra slim scope   . EP IMPLANTABLE DEVICE N/A 04/05/2015   Procedure: Loop Recorder Insertion;  Surgeon: Sanda Klein, MD;  Location: [facility] CV LAB;  Service: Cardiovascular;  Laterality: N/A;  . ESOPHAGOGASTRODUODENOSCOPY (EGD) WITH PROPOFOL N/A 10/21/2013   Procedure: ESOPHAGOGASTRODUODENOSCOPY (EGD) WITH PROPOFOL;  Surgeon: [name], MD;  Location: [facility] ENDOSCOPY;  Service: Endoscopy;  Laterality: N/A;  . IR GENERIC HISTORICAL  11/08/2015   IR FLUORO GUIDE CV LINE RIGHT 11/08/2015 [facility]-INTERV RAD  . IR GENERIC HISTORICAL  11/08/2015   IR US GUIDE VASC ACCESS RIGHT 11/08/2015 [facility]INTER[facility]  . MASTECTOMY    . NM MYOCAR PERF WALL MOTION  08/17/2008   mild to mod. superimposed ischemiamid antereoseptal,apical anterior & apical regions  . ORIF HUMERUS FRACTURE  06/19/2011   Procedure: OPEN REDUCTION INTERNAL FIXATION (ORIF) DISTAL HUMERUS FRACTURE;  Surgeon: [name], MD;  Location: [facility];  Service: Orthopedics;  Laterality: Left;  . ORIF HUMERUS FRACTURE  12/11/2011   Procedure: OPEN REDUCTION INTERNAL FIXATION (ORIF) HUMERAL SHAFT FRACTURE;  Surgeon: [name], MD;   Location: [facility] [facility];  Service: Orthopedics;  Laterality: Left;   Removal of Hardware left humerus, Open Reduction Internal Fixation Left Humeral Shaft Fracture Non-Union  . TONSILLECTOMY    . US ECHOCARDIOGRAPHY  03/16/2011   mild concentric LVH,mod LAE,stage I diastolic dysfunction,MAC,mild MR,aortic sclerosis,mild TR    OB History    No data available       Home Medications    Prior to Admission medications   Medication Sig Start Date End Date Taking? Authorizing Provider  ALPRAZolam Duanne Moron) 0.25 MG tablet Take 1 tablet by mouth 2 (two) times daily as needed for anxiety.  12/30/14  Yes Historical Provider, MD  buPROPion (WELLBUTRIN) 75 MG tablet Take 75-150 mg by mouth 2 (two) times daily. 75mg  in the morning, 150mg  in the evening   Yes Historical Provider, MD  ergocalciferol (VITAMIN D2) 50000 units capsule Take 1 capsule by mouth 2 (two) times a week.    Yes Historical Provider, MD  furosemide (LASIX) 40 MG tablet Take by mouth daily. Take 1 (40mg ) tab in the morning and half tab (20mg ) in the evening   Yes Historical Provider, MD  HYDROcodone-acetaminophen (NORCO) 10-325 MG per tablet Take 1-2 tablets by mouth every 6 (six) hours as needed for moderate pain.   Yes Historical Provider, MD  potassium chloride SA (K-DUR,KLOR-CON) 20 MEQ tablet Take 1 tablet (20 mEq total) by mouth 2 (two) times daily. 06/29/14  Yes John Molpus, MD  rOPINIRole (REQUIP) 1 MG tablet Take 2 mg by mouth at bedtime.    Yes Historical Provider, MD  spironolactone (ALDACTONE) 100 MG tablet Take 100 mg by mouth daily. 06/30/13  Yes Historical Provider, MD  traZODone (DESYREL) 50 MG tablet Take 50 mg by mouth at bedtime.   Yes Historical Provider, MD  zolpidem (AMBIEN) 5 MG tablet TK 1/2 T PO HS FOR INSOMNIA 07/06/15  Yes Historical Provider, MD  diazepam (VALIUM) 5 MG tablet Take 5 mg by mouth every 6 (six) hours as needed for anxiety.     Historical Provider, MD  lactulose (CHRONULAC) 10 GM/15ML solution Take 45 g by  mouth daily as needed for mild constipation.     Historical Provider, MD  promethazine (PHENERGAN) 12.5 MG tablet Take 12.5 mg by mouth every 6 (six) hours as needed for nausea.     Historical Provider, MD    Family History Family History  Problem Relation Age of Onset  . Stroke Father   . Anesthesia problems Daughter   . Hyperlipidemia Brother     Social History Social History  Substance Use Topics  . Smoking status: Never Smoker  . Smokeless tobacco: Never Used  . Alcohol use No     Allergies   Ondansetron hcl; Penicillins; Aspirin; Midazolam hcl; and Niacin and related   Review of Systems Review of Systems  ROS  10 Systems reviewed and are negative for acute change except as noted in the HPI.     Physical Exam Updated Vital Signs BP (!) 117/51 (BP Location: Right Arm)   Pulse 84   Temp 98.9 F (37.2 C) (Oral)   Resp 19   Ht 5\' 8"  (1.727 m)   Wt 214 lb 1.1 oz (97.1 kg)   SpO2 100%   BMI 32.55 kg/m   Physical Exam  Constitutional: She is oriented to person, place, and time. She appears well-developed.  HENT:  Head: Normocephalic and atraumatic.  Eyes: Conjunctivae and EOM are normal. Pupils are equal, round, and reactive to light.  Neck: Normal range of motion. Neck supple.  Cardiovascular: Normal rate, regular rhythm and normal heart sounds.   Pulmonary/Chest: Effort normal and breath sounds normal. No respiratory distress.  Abdominal: Soft. Bowel sounds are normal. She exhibits no distension. There is no tenderness. There is no rebound and no guarding.  Musculoskeletal: She exhibits edema.  RLE has edema and erythema and is warm to touch.  Neurological: She is alert and oriented to person, place, and time.  Skin: Skin is warm and dry. Rash noted.  Nursing note and vitals reviewed.    ED Treatments / Results  Labs (all labs ordered are listed, but only abnormal results are displayed) Labs Reviewed  COMPREHENSIVE METABOLIC PANEL - Abnormal; Notable  for the following:       Result Value   Potassium 3.2 (*)    BUN 26 (*)    Creatinine, Ser 1.15 (*)    Calcium 8.6 (*)    Total Protein 6.3 (*)    Albumin 2.9 (*)    AST 64 (*)    Alkaline Phosphatase 159 (*)    Total Bilirubin 8.2 (*)    GFR calc non Af Amer 48 (*)    GFR calc Af Amer 56 (*)    All other components within normal limits  CBC WITH DIFFERENTIAL/PLATELET - Abnormal; Notable for the following:    RBC 3.78 (*)    RDW 17.3 (*)    Platelets 34 (*)    Lymphs Abs 0.3 (*)    All other components within normal limits  URINALYSIS, ROUTINE W REFLEX MICROSCOPIC (NOT AT [facility]) - Abnormal; Notable for the following:    Color, Urine ORANGE (*)    APPearance CLOUDY (*)    Hgb urine dipstick SMALL (*)    Bilirubin Urine SMALL (*)    Nitrite POSITIVE (*)    Leukocytes, UA SMALL (*)    All other components within normal limits  PROTIME-INR - Abnormal; Notable for the following:    Prothrombin Time 22.9 (*)    All other components within normal limits  BLOOD GAS, VENOUS - Abnormal; Notable for the following:    pH, Ven 7.556 (*)    pCO2, Ven 26.7 (*)    pO2, Ven 48.0 (*)    Acid-Base Excess 2.6 (*)    All other components within normal limits  LACTIC ACID, PLASMA - Abnormal; Notable for the following:    Lactic Acid, Venous 2.9 (*)    All other components within normal limits  URINE MICROSCOPIC-ADD ON - Abnormal; Notable for the following:    Squamous Epithelial / LPF 0-5 (*)    Bacteria, UA FEW (*)    Casts HYALINE CASTS (*)    All other components within normal limits  TROPONIN I - Abnormal; Notable for the following:    Troponin I 0.31 (*)  All other components within normal limits  URINE RAPID DRUG SCREEN, HOSP PERFORMED - Abnormal; Notable for the following:    Opiates POSITIVE (*)    Benzodiazepines POSITIVE (*)    All other components within normal limits  CBC WITH DIFFERENTIAL/PLATELET - Abnormal; Notable for the following:    RBC 3.31 (*)    Hemoglobin 11.3  (*)    HCT 32.5 (*)    MCH 34.1 (*)    RDW 17.6 (*)    Platelets 34 (*)    Lymphs Abs 0.5 (*)    All other components within normal limits  COMPREHENSIVE METABOLIC PANEL - Abnormal; Notable for the following:    Potassium 2.8 (*)    BUN 28 (*)    Creatinine, Ser 1.05 (*)    Calcium 7.7 (*)    Total Protein 5.0 (*)    Albumin 2.3 (*)    AST 95 (*)    Total Bilirubin 5.2 (*)    GFR calc non Af Amer 54 (*)    All other components within normal limits  LACTIC ACID, PLASMA - Abnormal; Notable for the following:    Lactic Acid, Venous 3.3 (*)    All other components within normal limits  AMMONIA - Abnormal; Notable for the following:    Ammonia 53 (*)    All other components within normal limits  MAGNESIUM - Abnormal; Notable for the following:    Magnesium 1.4 (*)    All other components within normal limits  CK - Abnormal; Notable for the following:    Total CK 1,902 (*)    All other components within normal limits  GLUCOSE, CAPILLARY - Abnormal; Notable for the following:    Glucose-Capillary 119 (*)    All other components within normal limits  I-STAT CG4 LACTIC ACID, ED - Abnormal; Notable for the following:    Lactic Acid, Venous 4.16 (*)    All other components within normal limits  I-STAT CG4 LACTIC ACID, ED - Abnormal; Notable for the following:    Lactic Acid, Venous 3.20 (*)    All other components within normal limits  CULTURE, BLOOD (ROUTINE X 2)  CULTURE, BLOOD (ROUTINE X 2)  MRSA PCR SCREENING  URINE CULTURE  AMMONIA  PROCALCITONIN  GLUCOSE, CAPILLARY  LACTATE DEHYDROGENASE  TSH  FOLATE  VITAMIN B12  CORTISOL  TROPONIN I  TROPONIN I  TROPONIN I  I-STAT BETA HCG BLOOD, ED ([facility], [facility], [facility] ONLY)  I-STAT VENOUS BLOOD GAS, ED    EKG  EKG Interpretation None       Radiology Dg Chest 2 View  Result Date: 11/07/2015 CLINICAL DATA:  70 year old female with weakness, fever, and headache EXAM: CHEST  2 VIEW COMPARISON:  Chest radiograph dated 06/14/2013  FINDINGS: Two views of the chest do not demonstrate a focal consolidation. There is no pleural effusion or pneumothorax. Stable normal cardiac silhouette. No acute osseous pathology identified. Partially visualized left proximal humeral fixation plate and screws. Multiple surgical clips noted in the left breast area. IMPRESSION: No active cardiopulmonary disease. Electronically Signed   By: Anner Crete M.D.   On: 11/07/2015 19:30   Ct Head Wo Contrast  Result Date: 11/07/2015 CLINICAL DATA:  Acute onset of generalized weakness and fever. Altered mental status. Initial encounter. EXAM: CT HEAD WITHOUT CONTRAST TECHNIQUE: Contiguous axial images were obtained from the base of the skull through the vertex without intravenous contrast. COMPARISON:  None. FINDINGS: Brain: No evidence of acute infarction, hemorrhage, hydrocephalus, extra-axial collection or mass lesion/mass  effect. The posterior fossa, including the cerebellum, brainstem and fourth ventricle, is within normal limits. The third and lateral ventricles, and basal ganglia are unremarkable in appearance. The cerebral hemispheres are symmetric in appearance, with normal gray-white differentiation. No mass effect or midline shift is seen. Vascular: No hyperdense vessel or unexpected calcification. Skull: There is no evidence of fracture; visualized osseous structures are unremarkable in appearance. Sinuses/Orbits: The visualized portions of the orbits are within normal limits. The paranasal sinuses and mastoid air cells are well-aerated. Other: No significant soft tissue abnormalities are seen. IMPRESSION: Unremarkable noncontrast CT of the head. Electronically Signed   By: Garald Balding M.D.   On: 11/07/2015 23:29   US Abdomen Complete  Result Date: 11/08/2015 CLINICAL DATA:  Evaluate for portal vein thrombosis. Elevated LFTs. Initial encounter. EXAM: EXAM ULTRASOUND ABDOMEN COMPLETE DUPLEX ULTRASOUND OF LIVER TECHNIQUE: Complete abdominal  ultrasound examination was performed. Color and duplex Doppler ultrasound was also performed to evaluate the hepatic in-flow and out-flow vessels. COMPARISON:  CT of the abdomen and pelvis performed 10/01/2013, and abdominal ultrasound performed 04/09/2014 FINDINGS: Portal Vein Velocities Main: 31.2 cm/sec Right: 15.7  cm/sec Left: 13.3 cm/sec Hepatic Vein Velocities Right: 25.5 cm/sec Middle: 30.7 cm/sec Left: 22.6 cm/sec Hepatic artery velocity: 75.2 cm/sec Splenic vein velocity: 29.0 cm/sec Varices:  Known mild varices are difficult to fully characterize. Ascites: None seen. Vascular structures: There appears to be a left-sided recanalized umbilical vein. Normal hepatopetal flow is seen within the portal venous system, while normal triphasic antegrade flow is noted within the hepatic veins. There is no evidence of thrombosis. Gallbladder: Status post cholecystectomy.  No retained stones seen. Common bile duct: Diameter: 1.3 cm, likely within normal limits status post cholecystectomy. Liver: No focal lesion identified. Coarsened echotexture likely reflects fatty infiltration. The nodular contour raises concern for mild hepatic cirrhosis. IVC: No abnormality visualized. Pancreas: Not visualized due to overlying bowel gas. Spleen: Enlarged, measuring 18.4 cm in length. Right Kidney: Length: 11.2 cm. Echogenicity within normal limits. No mass or hydronephrosis visualized. Left Kidney: Length: 11.6 cm. Echogenicity within normal limits. No mass or hydronephrosis visualized. Abdominal aorta: No aneurysm visualized. Not characterized distally due to overlying bowel gas. Other Findings: None. IMPRESSION: 1. No evidence of portal vein thrombosis. 2. Changes of mild hepatic cirrhosis, with recanalized left-sided umbilical vein. 3. Normal hepatopetal flow within the portal venous system, and normal triphasic vascular flow within the hepatic veins. 4. Splenomegaly noted. 5. Known mild varices are difficult to fully  characterize. Electronically Signed   By: Garald Balding M.D.   On: 11/08/2015 02:27   Dg Pelvis Portable  Result Date: 11/07/2015 CLINICAL DATA:  Status post fall, with concern for pelvic injury. Initial encounter. EXAM: PORTABLE PELVIS 1-2 VIEWS COMPARISON:  CT of the abdomen and pelvis performed 06/03/2013 FINDINGS: There is no evidence of fracture or dislocation. Both femoral heads are seated normally within their respective acetabula. No significant degenerative change is appreciated. The sacroiliac joints are unremarkable in appearance. The visualized bowel gas pattern is grossly unremarkable in appearance. Clips are seen overlying the left inguinal region. IMPRESSION: No evidence of fracture or dislocation. Electronically Signed   By: Garald Balding M.D.   On: 11/07/2015 23:21   [place] Art/ven Flow Abd Pelv Doppler  Result Date: 11/08/2015 CLINICAL DATA:  Evaluate for portal vein thrombosis. Elevated LFTs. Initial encounter. EXAM: EXAM ULTRASOUND ABDOMEN COMPLETE DUPLEX ULTRASOUND OF LIVER TECHNIQUE: Complete abdominal ultrasound examination was performed. Color and duplex Doppler ultrasound was also performed to evaluate the  hepatic in-flow and out-flow vessels. COMPARISON:  CT of the abdomen and pelvis performed 10/01/2013, and abdominal ultrasound performed 04/09/2014 FINDINGS: Portal Vein Velocities Main: 31.2 cm/sec Right: 15.7  cm/sec Left: 13.3 cm/sec Hepatic Vein Velocities Right: 25.5 cm/sec Middle: 30.7 cm/sec Left: 22.6 cm/sec Hepatic artery velocity: 75.2 cm/sec Splenic vein velocity: 29.0 cm/sec Varices:  Known mild varices are difficult to fully characterize. Ascites: None seen. Vascular structures: There appears to be a left-sided recanalized umbilical vein. Normal hepatopetal flow is seen within the portal venous system, while normal triphasic antegrade flow is noted within the hepatic veins. There is no evidence of thrombosis. Gallbladder: Status post cholecystectomy.  No retained stones  seen. Common bile duct: Diameter: 1.3 cm, likely within normal limits status post cholecystectomy. Liver: No focal lesion identified. Coarsened echotexture likely reflects fatty infiltration. The nodular contour raises concern for mild hepatic cirrhosis. IVC: No abnormality visualized. Pancreas: Not visualized due to overlying bowel gas. Spleen: Enlarged, measuring 18.4 cm in length. Right Kidney: Length: 11.2 cm. Echogenicity within normal limits. No mass or hydronephrosis visualized. Left Kidney: Length: 11.6 cm. Echogenicity within normal limits. No mass or hydronephrosis visualized. Abdominal aorta: No aneurysm visualized. Not characterized distally due to overlying bowel gas. Other Findings: None. IMPRESSION: 1. No evidence of portal vein thrombosis. 2. Changes of mild hepatic cirrhosis, with recanalized left-sided umbilical vein. 3. Normal hepatopetal flow within the portal venous system, and normal triphasic vascular flow within the hepatic veins. 4. Splenomegaly noted. 5. Known mild varices are difficult to fully characterize. Electronically Signed   By: Garald Balding M.D.   On: 11/08/2015 02:27   Ir Fluoro Guide Cv Line Right  Result Date: 11/08/2015 CLINICAL DATA:  Sepsis, needs durable venous access. EXAM: PICC PLACEMENT WITH ULTRASOUND AND FLUOROSCOPY FLUOROSCOPY TIME:  6 seconds, 1 mGy TECHNIQUE: After written informed consent was obtained, patient was placed in the supine position on angiographic table. Patency of the right basilic vein was confirmed with ultrasound with image documentation. An appropriate skin site was determined. Skin site was marked. Region was prepped using maximum barrier technique including cap and mask, sterile gown, sterile gloves, large sterile sheet, and Chlorhexidine as cutaneous antisepsis. The region was infiltrated locally with 1% lidocaine. Under real-time ultrasound guidance, the right basilic vein was accessed with a 21 gauge micropuncture needle; the needle tip  within the vein was confirmed with ultrasound image documentation. Needle exchanged over a 018 guidewire for a peel-away sheath, through which a 5-French double-lumen power injectable PICC trimmed to 38cm was advanced, positioned with its tip near the cavoatrial junction. Spot chest radiograph confirms appropriate catheter position. Catheter was flushed per protocol and secured externally. The patient tolerated procedure well. COMPLICATIONS: COMPLICATIONS none IMPRESSION: 1. Technically successful five [place] double lumen power injectable PICC placement Electronically Signed   By: Lucrezia Europe M.D.   On: 11/08/2015 16:33   Ir US Guide Vasc Access Right  Result Date: 11/08/2015 CLINICAL DATA:  Sepsis, needs durable venous access. EXAM: PICC PLACEMENT WITH ULTRASOUND AND FLUOROSCOPY FLUOROSCOPY TIME:  6 seconds, 1 mGy TECHNIQUE: After written informed consent was obtained, patient was placed in the supine position on angiographic table. Patency of the right basilic vein was confirmed with ultrasound with image documentation. An appropriate skin site was determined. Skin site was marked. Region was prepped using maximum barrier technique including cap and mask, sterile gown, sterile gloves, large sterile sheet, and Chlorhexidine as cutaneous antisepsis. The region was infiltrated locally with 1% lidocaine. Under real-time ultrasound guidance, the  right basilic vein was accessed with a 21 gauge micropuncture needle; the needle tip within the vein was confirmed with ultrasound image documentation. Needle exchanged over a 018 guidewire for a peel-away sheath, through which a 5-French double-lumen power injectable PICC trimmed to 38cm was advanced, positioned with its tip near the cavoatrial junction. Spot chest radiograph confirms appropriate catheter position. Catheter was flushed per protocol and secured externally. The patient tolerated procedure well. COMPLICATIONS: COMPLICATIONS none IMPRESSION: 1. Technically  successful five [place] double lumen power injectable PICC placement Electronically Signed   By: Lucrezia Europe M.D.   On: 11/08/2015 16:33    Procedures .Critical Care Performed by: Varney Biles Authorized by: Varney Biles   Critical care provider statement:    Critical care time (minutes):  45   Critical care time was exclusive of:  Separately billable procedures and treating other patients   Critical care was necessary to treat or prevent imminent or life-threatening deterioration of the following conditions:  Sepsis and hepatic failure   Critical care was time spent personally by me on the following activities:  Blood draw for specimens, development of treatment plan with patient or surrogate, discussions with consultants, evaluation of patient's response to treatment, examination of patient, ordering and performing treatments and interventions, ordering and review of laboratory studies, ordering and review of radiographic studies, pulse oximetry, re-evaluation of patient's condition and review of old charts    (including critical care time)  Medications Ordered in ED Medications  vancomycin (VANCOCIN) 1,500 mg in sodium chloride 0.9 % 500 mL IVPB (0 mg Intravenous Stopped 11/07/15 2219)    Followed by  vancomycin (VANCOCIN) IVPB 750 mg/150 ml premix (750 mg Intravenous Given 11/08/15 0922)  acetaminophen (TYLENOL) tablet 650 mg (650 mg Oral Given 11/08/15 1218)    Or  acetaminophen (TYLENOL) suppository 650 mg ( Rectal See Alternative 11/08/15 1218)  levofloxacin (LEVAQUIN) IVPB 750 mg (not administered)  lactulose (CHRONULAC) 10 GM/15ML solution 45 g (45 g Oral Given 11/08/15 0920)  rOPINIRole (REQUIP) tablet 2 mg (not administered)  buPROPion (WELLBUTRIN) tablet 75 mg (not administered)  buPROPion (WELLBUTRIN) tablet 150 mg (not administered)  sodium chloride flush (NS) 0.9 % injection 10-40 mL (not administered)  sodium chloride flush (NS) 0.9 % injection 10-40 mL (not administered)    sodium chloride 0.9 % bolus 3,000 mL (0 mLs Intravenous Stopped 11/07/15 2248)  aztreonam (AZACTAM) 2 g in dextrose 5 % 50 mL IVPB (0 g Intravenous Stopped 11/07/15 2024)  metroNIDAZOLE (FLAGYL) IVPB 500 mg (0 mg Intravenous Stopped 11/07/15 2143)  oxyCODONE (Oxy IR/ROXICODONE) immediate release tablet 5 mg (5 mg Oral Given 11/07/15 2041)  acetaminophen (TYLENOL) tablet 650 mg (650 mg Oral Given 11/07/15 2220)  levofloxacin (LEVAQUIN) IVPB 750 mg (750 mg Intravenous Transfusing/Transfer 11/07/15 2341)  potassium chloride 10 mEq in 100 mL IVPB (10 mEq Intravenous Given 11/08/15 0816)  potassium chloride (K-DUR,KLOR-CON) CR tablet 30 mEq (30 mEq Oral Given 11/08/15 0422)  sodium chloride 0.9 % bolus 1,000 mL (1,000 mLs Intravenous Given 11/08/15 0421)  sodium chloride 0.9 % bolus 1,000 mL (1,000 mLs Intravenous Given 11/08/15 0624)  dextrose 50 % solution 50 mL (50 mLs Intravenous Given 11/08/15 0625)  magnesium sulfate IVPB 2 g 50 mL (2 g Intravenous Given 11/08/15 0625)  phytonadione (VITAMIN K) 1 mg in dextrose 5 % 50 mL IVPB (1 mg Intravenous Given 11/08/15 0850)  lidocaine (XYLOCAINE) 1 % (with pres) injection (  Duplicate [id number])  lidocaine (XYLOCAINE) 1 % (with pres) injection (10 mLs  Infiltration Given 11/08/15 1627)     Initial Impression / Assessment and Plan / ED Course  I have reviewed the triage vital signs and the nursing notes.  Pertinent labs & imaging results that were available during my care of the patient were reviewed by me and considered in my medical decision making (see chart for details).  Clinical Course  Value Comment By Time  Lactic Acid, Venous: (!!) 4.16 Lactate > 4, bili > 8 and AKI. Likely some of the lactate is from her liver failure - but I think it is still best to hydrate her with 30 cc/kg. Medicine to admit -but they would appreciate a repeat lactate before deciding stepdown vs. Tele. Varney Biles, MD 09/04 2045   Repeat lactate is not crossing over, but it was < 4.    Repeat sepsis assessment completed at that time - pt is completing her fluids. Still febrile - so we will get her some tylenol.  Varney Biles, MD 09/04 2245    Pt comes in with cc of weakness. Noted to have fevers and tachycardia. Her source of infection appears to be leg. We will use the sepsis order set for soft tissue to start antibiotics.  Pt also has liver failure, borderline DM and thereby immunocompromised. We will also order US DVT for the leg.  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

## 2015-11-07 NOTE — ED Notes (Signed)
EPD [place] notified about sepsis possibility.

## 2015-11-07 NOTE — Progress Notes (Signed)
Pharmacy Antibiotic Note  [name] is a 70 y.o. female admitted on 11/07/2015 with sepsis.  Pharmacy has been consulted for Levofloxacin dosing. Levofloxacin dosed based on patient weight and renal function  Plan: Levofloxacin 750mg  iv q24hr       Height: 5\' 9"  (175.3 cm) Weight: 200 lb (90.7 kg) IBW/kg (Calculated) : 66.2  Temp (24hrs), Avg:101.6 F (38.7 C), Min:99.8 F (37.7 C), Max:103.2 F (39.6 C)   Recent Labs Lab 11/07/15 1910 11/07/15 1918 11/07/15 2238  WBC 7.9  --   --   CREATININE 1.15*  --   --   LATICACIDVEN  --  4.16* 3.20*    Estimated Creatinine Clearance: 57 mL/min (by C-G formula based on SCr of 1.15 mg/dL).    Allergies  Allergen Reactions  . Ondansetron Hcl Other (See Comments)    Other Reaction: High B/P  . Penicillins Anaphylaxis, Itching and Swelling    Has patient had a PCN reaction causing immediate rash, facial/tongue/throat swelling, SOB or lightheadedness with hypotension: Yes Has patient had a PCN reaction causing severe rash involving mucus membranes or skin necrosis: No Has patient had a PCN reaction that required hospitalization No Has patient had a PCN reaction occurring within the last 10 years: No If all of the above answers are "NO", then may proceed with Cephalosporin use.   . Aspirin     Has liver disease  . Midazolam Hcl Other (See Comments)    Daughter has allergy, doctors advised to warn against using.Marland KitchenMarland KitchenPropofol is okay to use.  . Niacin And Related Itching    Feels like skin is on fire    Antimicrobials this admission: 9/4 Vancomycin >>  9/4 Aztreonam >>  9/4 Flagyl x1 9/4 Levofloxacin >>  Dose adjustments this admission: -  Microbiology results: 9/4 BCx: collected 9/4 UCx: ordered   Thank you for allowing pharmacy to be a part of this patient's care.  Nani Skillern Crowford 11/07/2015 11:15 PM

## 2015-11-07 NOTE — ED Notes (Signed)
Lab draw unsuccessful RN made aware 

## 2015-11-07 NOTE — ED Notes (Signed)
Pt transported to XR.  

## 2015-11-07 NOTE — ED Notes (Signed)
MD notified of I STAT lactic acid

## 2015-11-07 NOTE — ED Notes (Signed)
MD Kathrynn Humble made aware of temp.

## 2015-11-07 NOTE — ED Notes (Signed)
Bed: WA07 Expected date:  Expected time:  Means of arrival:  Comments: EMS- 70yo F, weakness/fever

## 2015-11-07 NOTE — ED Triage Notes (Addendum)
Pt BIB [facility] from home. C/o of weakness and fever x2 days. Also states that she has had intermitten N/V. A&Ox4, but appears very drowsy. Hx of liver problems. States that she took tylenol at around noon today.

## 2015-11-08 ENCOUNTER — Inpatient Hospital Stay (HOSPITAL_COMMUNITY): Payer: Medicare Other

## 2015-11-08 ENCOUNTER — Encounter (HOSPITAL_COMMUNITY): Payer: Self-pay | Admitting: Interventional Radiology

## 2015-11-08 DIAGNOSIS — R609 Edema, unspecified: Secondary | ICD-10-CM

## 2015-11-08 DIAGNOSIS — R7989 Other specified abnormal findings of blood chemistry: Secondary | ICD-10-CM

## 2015-11-08 DIAGNOSIS — I10 Essential (primary) hypertension: Secondary | ICD-10-CM

## 2015-11-08 HISTORY — PX: IR GENERIC HISTORICAL: IMG1180011

## 2015-11-08 LAB — BASIC METABOLIC PANEL
ANION GAP: 3 — AB (ref 5–15)
BUN: 28 mg/dL — AB (ref 4–21)
BUN: 32 mg/dL — AB (ref 4–21)
BUN: 32 mg/dL — ABNORMAL HIGH (ref 6–20)
CALCIUM: 7.6 mg/dL — AB (ref 8.9–10.3)
CO2: 24 mmol/L (ref 22–32)
CREATININE: 1 mg/dL (ref 0.5–1.1)
Chloride: 111 mmol/L (ref 101–111)
Creatinine, Ser: 0.95 mg/dL (ref 0.44–1.00)
Creatinine: 1.1 mg/dL (ref 0.5–1.1)
GLUCOSE: 88 mg/dL
Glucose, Bld: 122 mg/dL — ABNORMAL HIGH (ref 65–99)
Glucose: 122 mg/dL
POTASSIUM: 2.8 mmol/L — AB (ref 3.4–5.3)
POTASSIUM: 2.8 mmol/L — AB (ref 3.4–5.3)
POTASSIUM: 2.8 mmol/L — AB (ref 3.5–5.1)
SODIUM: 141 mmol/L (ref 137–147)
Sodium: 138 mmol/L (ref 137–147)
Sodium: 138 mmol/L (ref 135–145)

## 2015-11-08 LAB — CBC WITH DIFFERENTIAL/PLATELET
BASOS ABS: 0 10*3/uL (ref 0.0–0.1)
BASOS PCT: 0 %
EOS PCT: 0 %
Eosinophils Absolute: 0 10*3/uL (ref 0.0–0.7)
HCT: 32.5 % — ABNORMAL LOW (ref 36.0–46.0)
Hemoglobin: 11.3 g/dL — ABNORMAL LOW (ref 12.0–15.0)
LYMPHS PCT: 6 %
Lymphs Abs: 0.5 10*3/uL — ABNORMAL LOW (ref 0.7–4.0)
MCH: 34.1 pg — ABNORMAL HIGH (ref 26.0–34.0)
MCHC: 34.8 g/dL (ref 30.0–36.0)
MCV: 98.2 fL (ref 78.0–100.0)
MONO ABS: 0.5 10*3/uL (ref 0.1–1.0)
MONOS PCT: 7 %
Neutro Abs: 6.5 10*3/uL (ref 1.7–7.7)
Neutrophils Relative %: 87 %
PLATELETS: 34 10*3/uL — AB (ref 150–400)
RBC: 3.31 MIL/uL — ABNORMAL LOW (ref 3.87–5.11)
RDW: 17.6 % — AB (ref 11.5–15.5)
WBC: 7.5 10*3/uL (ref 4.0–10.5)

## 2015-11-08 LAB — COMPREHENSIVE METABOLIC PANEL
ALT: 40 U/L (ref 14–54)
ANION GAP: 7 (ref 5–15)
AST: 95 U/L — AB (ref 15–41)
Albumin: 2.3 g/dL — ABNORMAL LOW (ref 3.5–5.0)
Alkaline Phosphatase: 112 U/L (ref 38–126)
BILIRUBIN TOTAL: 5.2 mg/dL — AB (ref 0.3–1.2)
BUN: 28 mg/dL — AB (ref 6–20)
CHLORIDE: 111 mmol/L (ref 101–111)
CO2: 23 mmol/L (ref 22–32)
Calcium: 7.7 mg/dL — ABNORMAL LOW (ref 8.9–10.3)
Creatinine, Ser: 1.05 mg/dL — ABNORMAL HIGH (ref 0.44–1.00)
GFR calc Af Amer: >60 mL/min (ref >60)
GFR, EST NON AFRICAN AMERICAN: 54 mL/min — AB (ref >60)
Glucose, Bld: 88 mg/dL (ref 65–99)
POTASSIUM: 2.8 mmol/L — AB (ref 3.5–5.1)
Sodium: 141 mmol/L (ref 135–145)
TOTAL PROTEIN: 5 g/dL — AB (ref 6.5–8.1)

## 2015-11-08 LAB — VITAMIN B12: Vitamin B-12: 625 pg/mL (ref 180–914)

## 2015-11-08 LAB — RAPID URINE DRUG SCREEN, HOSP PERFORMED
Amphetamines: NOT DETECTED
BARBITURATES: NOT DETECTED
BENZODIAZEPINES: POSITIVE — AB
COCAINE: NOT DETECTED
OPIATES: POSITIVE — AB
TETRAHYDROCANNABINOL: NOT DETECTED

## 2015-11-08 LAB — LACTATE DEHYDROGENASE: LDH: 304 U/L — ABNORMAL HIGH (ref 98–192)

## 2015-11-08 LAB — HEPATIC FUNCTION PANEL
ALT: 40 U/L — AB (ref 7–35)
AST: 95 U/L — AB (ref 13–35)
Alkaline Phosphatase: 112 U/L (ref 25–125)
BILIRUBIN, TOTAL: 5.2 mg/dL

## 2015-11-08 LAB — GLUCOSE, CAPILLARY
GLUCOSE-CAPILLARY: 71 mg/dL (ref 65–99)
GLUCOSE-CAPILLARY: 95 mg/dL (ref 65–99)
Glucose-Capillary: 119 mg/dL — ABNORMAL HIGH (ref 65–99)

## 2015-11-08 LAB — ECHOCARDIOGRAM COMPLETE
Height: 68 in
Weight: 3425.07 [oz_av]

## 2015-11-08 LAB — TROPONIN I
TROPONIN I: 0.24 ng/mL — AB (ref <0.03)
Troponin I: 0.31 ng/mL (ref <0.03)

## 2015-11-08 LAB — LACTIC ACID, PLASMA: LACTIC ACID, VENOUS: 3.3 mmol/L — AB (ref 0.5–1.9)

## 2015-11-08 LAB — MAGNESIUM
Magnesium: 1.4 mg/dL — ABNORMAL LOW (ref 1.7–2.4)
Magnesium: 1.9 mg/dL (ref 1.7–2.4)

## 2015-11-08 LAB — CBC AND DIFFERENTIAL
HCT: 33 % — AB (ref 36–46)
Hemoglobin: 11.3 g/dL — AB (ref 12.0–16.0)
Platelets: 34 10*3/uL — AB (ref 150–399)
WBC: 7.5 10^3/mL

## 2015-11-08 LAB — PROCALCITONIN: PROCALCITONIN: 2.51 ng/mL

## 2015-11-08 LAB — AMMONIA: AMMONIA: 53 umol/L — AB (ref 9–35)

## 2015-11-08 LAB — CK: CK TOTAL: 1902 U/L — AB (ref 38–234)

## 2015-11-08 LAB — MRSA PCR SCREENING: MRSA by PCR: NEGATIVE

## 2015-11-08 LAB — CORTISOL: CORTISOL PLASMA: 8.6 ug/dL

## 2015-11-08 LAB — TSH: TSH: 2.353 u[IU]/mL (ref 0.350–4.500)

## 2015-11-08 LAB — FOLATE: Folate: 10.9 ng/mL (ref >5.9)

## 2015-11-08 MED ORDER — POTASSIUM CHLORIDE IN NACL 20-0.9 MEQ/L-% IV SOLN: INTRAVENOUS | Status: DC

## 2015-11-08 MED ORDER — SODIUM CHLORIDE 0.9% FLUSH
10.0000 mL | Freq: Two times a day (BID) | INTRAVENOUS | Status: DC
  Administered 2015-11-08 – 2015-11-12 (×6): 10 mL

## 2015-11-08 MED ORDER — LIDOCAINE HCL 1 % IJ SOLN
INTRAMUSCULAR | Status: AC | PRN
  Administered 2015-11-08: 10 mL

## 2015-11-08 MED ORDER — POTASSIUM CHLORIDE 10 MEQ/100ML IV SOLN
10.0000 meq | INTRAVENOUS | Status: AC
  Administered 2015-11-08 (×4): 10 meq via INTRAVENOUS
  Filled 2015-11-08 (×4): qty 100

## 2015-11-08 MED ORDER — DEXTROSE 50 % IV SOLN
1.0000 | Freq: Once | INTRAVENOUS | Status: AC
  Administered 2015-11-08: 50 mL via INTRAVENOUS
  Filled 2015-11-08: qty 50

## 2015-11-08 MED ORDER — SODIUM CHLORIDE 0.9 % IV BOLUS (SEPSIS)
1000.0000 mL | Freq: Once | INTRAVENOUS | Status: AC
  Administered 2015-11-08: 1000 mL via INTRAVENOUS

## 2015-11-08 MED ORDER — VITAMIN K1 10 MG/ML IJ SOLN
1.0000 mg | Freq: Once | INTRAVENOUS | Status: AC
  Administered 2015-11-08: 1 mg via INTRAVENOUS
  Filled 2015-11-08: qty 0.1

## 2015-11-08 MED ORDER — BUPROPION HCL 75 MG PO TABS
75.0000 mg | ORAL_TABLET | Freq: Every day | ORAL | Status: DC
  Administered 2015-11-09 – 2015-11-13 (×5): 75 mg via ORAL
  Filled 2015-11-08 (×5): qty 1

## 2015-11-08 MED ORDER — LACTULOSE 10 GM/15ML PO SOLN
45.0000 g | Freq: Two times a day (BID) | ORAL | Status: DC
  Administered 2015-11-08 (×2): 45 g via ORAL
  Filled 2015-11-08 (×3): qty 90

## 2015-11-08 MED ORDER — BUPROPION HCL 75 MG PO TABS: 75.0000 mg | ORAL_TABLET | Freq: Two times a day (BID) | ORAL | Status: DC

## 2015-11-08 MED ORDER — SODIUM CHLORIDE 0.9% FLUSH
10.0000 mL | INTRAVENOUS | Status: DC | PRN
  Administered 2015-11-09: 20 mL
  Administered 2015-11-11 – 2015-11-12 (×2): 10 mL
  Filled 2015-11-08 (×3): qty 40

## 2015-11-08 MED ORDER — POTASSIUM CHLORIDE CRYS ER 20 MEQ PO TBCR
30.0000 meq | EXTENDED_RELEASE_TABLET | Freq: Once | ORAL | Status: AC
  Administered 2015-11-08: 30 meq via ORAL
  Filled 2015-11-08: qty 1

## 2015-11-08 MED ORDER — LIDOCAINE HCL 1 % IJ SOLN
INTRAMUSCULAR | Status: AC
Start: 2015-11-08 — End: 2015-11-08
  Filled 2015-11-08: qty 20

## 2015-11-08 MED ORDER — BUPROPION HCL 75 MG PO TABS
150.0000 mg | ORAL_TABLET | Freq: Every day | ORAL | Status: DC
  Administered 2015-11-08 – 2015-11-12 (×5): 150 mg via ORAL
  Filled 2015-11-08 (×5): qty 2

## 2015-11-08 MED ORDER — MAGNESIUM SULFATE 2 GM/50ML IV SOLN
2.0000 g | Freq: Once | INTRAVENOUS | Status: AC
  Administered 2015-11-08: 2 g via INTRAVENOUS
  Filled 2015-11-08: qty 50

## 2015-11-08 MED ORDER — HYDROCODONE-ACETAMINOPHEN 5-325 MG PO TABS
1.0000 | ORAL_TABLET | ORAL | Status: DC | PRN
  Filled 2015-11-08: qty 1

## 2015-11-08 MED ORDER — ROPINIROLE HCL 1 MG PO TABS
2.0000 mg | ORAL_TABLET | Freq: Every day | ORAL | Status: DC
  Administered 2015-11-08 – 2015-11-12 (×5): 2 mg via ORAL
  Filled 2015-11-08 (×6): qty 2

## 2015-11-08 MED ORDER — OXYCODONE HCL 5 MG PO TABS
5.0000 mg | ORAL_TABLET | ORAL | Status: DC | PRN
  Administered 2015-11-08: 5 mg via ORAL
  Filled 2015-11-08: qty 1

## 2015-11-08 MED ORDER — POTASSIUM CHLORIDE 10 MEQ/50ML IV SOLN
10.0000 meq | INTRAVENOUS | Status: AC
  Administered 2015-11-08 (×4): 10 meq via INTRAVENOUS
  Filled 2015-11-08 (×2): qty 50

## 2015-11-08 MED ORDER — POTASSIUM CHLORIDE CRYS ER 20 MEQ PO TBCR
40.0000 meq | EXTENDED_RELEASE_TABLET | Freq: Once | ORAL | Status: AC
  Administered 2015-11-08: 40 meq via ORAL
  Filled 2015-11-08: qty 2

## 2015-11-08 NOTE — Progress Notes (Signed)
[facility] Progress Note  Patient: [name] [id number]   PCP: Sheela Stack, MD DOB: March 26, 1945   DOA: 11/07/2015   DOS: 11/08/2015   Date of Service: the patient was seen and examined on 11/08/2015  Subjective: The patient is significantly drowsy and lethargic in the morning. After initiation of the lactulose to drowsiness improved. Denies any acute complaint. Complains about right leg pain since admission. Nutrition: Minimal oral intake due to lethargy  Brief hospital course: Pt. with PMH of cirrhosis of the liver nonalcoholic, chronic thrombocytopenia, hypertension; admitted on 11/07/2015, with complaint of lethargy, was found to have sepsis with cellulitis of the right lower extremity. Currently further plan is continue IV antibiotics.  Assessment and Plan: 1. Sepsis ([place]) Secondary to cellulitis. With fever and lethargy and tachypnea and the patient is meeting sepsis criteria on admission. Chest x-ray unremarkable, ultrasound abdomen shows no evidence of portal vein thrombosis or any other acute abnormality Urine analysis is negative as well. Right lower extremity is red and swollen warm to touch and tender. Mild elevation of CPK although no evidence of compartment syndrome. DVT ruled out on ultrasound Doppler. With cellulitis as the only potential cause of infection, Continue IV vancomycin and Levaquin. Discontinue aztreonam.  2. Fall. Patient presented with a suspected fall. Neurologically the patient does not have any focal deficit.  X-ray of pelvis and CT of the head is unremarkable. This is likely secondary to medication as well as hepatic encephalopathy. Continue close monitoring.  3. Suspected hepatic encephalopathy. Mild elevation of ammonia level although the patient has asterixis present and has been sleeping during the day. Patient has multiple bowel movement after initiation of the lactulose and shows improvement in mentation as well. We'll continue  to monitor.  4. Hypokalemia. Replacing aggressively on rechecking.  5. Rhabdomyolysis. Mild elevation of CK. Unclear of the etiology. Likely from immobilization. No evidence of renal damage. We'll continue with hydration.  6. Elevated troponin. Echogram does not show any evidence of acute wall motion abnormality. Ejection fraction is also normal. Next on this is likely in the setting of demand ischemia. Next and EKG unremarkable as well. Continue monitoring and serial troponin.  7. Cirrhosis of the liver. Nonalcoholic. Coagulopathy. Lactic acidosis. Patient has significant dysfunction of liver with elevated INR, low albumin and elevated bilirubin. Vitamin K given. We will continue to monitor. Lactic acidosis is also likely secondary to liver dysfunction instead of hypoperfusion.  8. Restless leg syndrome. Resume Requip.  9. Poor IV access.   patient is critically ill with poor IV access. IR guided PICC line was inserted.  Pain management: PRN tylenol Activity: consulted physical therapy Bowel regimen: last BM 11/08/2015 Diet: cardiac diet DVT Prophylaxis: mechanical compression device.  Advance goals of care discussion: full code  Family Communication: no family was present at bedside, at the time of interview.   Disposition:  Discharge to SNF. Expected discharge date: 11/12/2015  Consultants: none Procedures: none  Antibiotics: Anti-infectives    Start     Dose/Rate Route Frequency Ordered Stop   11/08/15 2200  levofloxacin (LEVAQUIN) IVPB 750 mg     750 mg 100 mL/hr over 90 Minutes Intravenous Every 24 hours 11/07/15 2314     11/08/15 1000  vancomycin (VANCOCIN) IVPB 750 mg/150 ml premix     750 mg 150 mL/hr over 60 Minutes Intravenous Every 12 hours 11/07/15 2009     11/08/15 0400  aztreonam (AZACTAM) 1 g in dextrose 5 % 50 mL IVPB  Status:  Discontinued  1 g 100 mL/hr over 30 Minutes Intravenous Every 8 hours 11/07/15 2010 11/08/15 1716   11/08/15  0400  metroNIDAZOLE (FLAGYL) IVPB 500 mg  Status:  Discontinued     500 mg 100 mL/hr over 60 Minutes Intravenous Every 8 hours 11/07/15 2010 11/07/15 2250   11/07/15 2300  levofloxacin (LEVAQUIN) IVPB 750 mg     750 mg 100 mL/hr over 90 Minutes Intravenous  Once 11/07/15 2250 11/08/15 0046   11/07/15 2300  aztreonam (AZACTAM) 2 g in dextrose 5 % 50 mL IVPB  Status:  Discontinued     2 g 100 mL/hr over 30 Minutes Intravenous  Once 11/07/15 2250 11/07/15 2252   11/07/15 2300  vancomycin (VANCOCIN) IVPB 1000 mg/200 mL premix  Status:  Discontinued     1,000 mg 200 mL/hr over 60 Minutes Intravenous  Once 11/07/15 2250 11/07/15 2252   11/07/15 2015  vancomycin (VANCOCIN) 1,500 mg in sodium chloride 0.9 % 500 mL IVPB     1,500 mg 250 mL/hr over 120 Minutes Intravenous STAT 11/07/15 2009 11/07/15 2219   11/07/15 1945  aztreonam (AZACTAM) 2 g in dextrose 5 % 50 mL IVPB     2 g 100 mL/hr over 30 Minutes Intravenous  Once 11/07/15 1930 11/07/15 2024   11/07/15 1945  metroNIDAZOLE (FLAGYL) IVPB 500 mg     500 mg 100 mL/hr over 60 Minutes Intravenous  Once 11/07/15 1930 11/07/15 2143   11/07/15 1930  vancomycin (VANCOCIN) IVPB 1000 mg/200 mL premix  Status:  Discontinued     1,000 mg 200 mL/hr over 60 Minutes Intravenous  Once 11/07/15 1929 11/07/15 2108        Intake/Output Summary (Last 24 hours) at 11/08/15 1816 Last data filed at 11/08/15 1735  Gross per 24 hour  Intake          3903.33 ml  Output              770 ml  Net          3133.33 ml   Filed Weights   11/07/15 1949 11/08/15 0020  Weight: 90.7 kg (200 lb) 97.1 kg (214 lb 1.1 oz)    Objective: Physical Exam: Vitals:   11/08/15 0900 11/08/15 1000 11/08/15 1100 11/08/15 1200  BP: (!) 92/32 (!) 80/33 (!) 105/39 (!) 117/51  Pulse: 78 81 87 84  Resp: 16 19 15 19   Temp:    98.9 F (37.2 C)  TempSrc:    Oral  SpO2: 98% 99% 99% 100%  Weight:      Height:        General: Alert, Awake and Oriented to Time, Place and Person.  Appear in moderate distress Eyes: PERRL, Conjunctiva normal ENT: Oral Mucosa clear moist. Neck: difficult to assess JVD, no Abnormal Mass Or lumps Cardiovascular: S1 and S2 Present, no Murmur, Respiratory: Bilateral Air entry equal and Decreased, Clear to Auscultation, n Crackles, no wheezes Abdomen: Bowel Sound present, Soft and no tenderness Skin: right leg redness, no Rash  Extremities: bilateral Pedal edema, right calf tenderness Neurologic: Grossly no focal neuro deficit. Bilaterally Equal motor strength Lethargic with improvement in the afternoon,  asterixis present initially with resolution.  Data Reviewed: CBC:  Recent Labs Lab 11/07/15 1910 11/08/15 0215  WBC 7.9 7.5  NEUTROABS 7.2 6.5  HGB 12.8 11.3*  HCT 36.7 32.5*  MCV 97.1 98.2  PLT 34* 34*   Basic Metabolic Panel:  Recent Labs Lab 11/07/15 1910 11/08/15 0215 11/08/15 0316  NA 139 141  --  K 3.2* 2.8*  --   CL 104 111  --   CO2 25 23  --   GLUCOSE 91 88  --   BUN 26* 28*  --   CREATININE 1.15* 1.05*  --   CALCIUM 8.6* 7.7*  --   MG  --   --  1.4*    Liver Function Tests:  Recent Labs Lab 11/07/15 1910 11/08/15 0215  AST 64* 95*  ALT 37 40  ALKPHOS 159* 112  BILITOT 8.2* 5.2*  PROT 6.3* 5.0*  ALBUMIN 2.9* 2.3*   No results for input(s): LIPASE, AMYLASE in the last 168 hours.  Recent Labs Lab 11/07/15 1940 11/08/15 0215  AMMONIA 34 53*   Coagulation Profile:  Recent Labs Lab 11/07/15 1940  INR 2.00   Cardiac Enzymes:  Recent Labs Lab 11/08/15 0200 11/08/15 0316  CKTOTAL  --  1,902*  TROPONINI 0.31*  --    BNP (last 3 results) No results for input(s): PROBNP in the last 8760 hours.  CBG:  Recent Labs Lab 11/08/15 0541 11/08/15 1142  GLUCAP 71 119*    Studies: Dg Chest 2 View  Result Date: 11/07/2015 CLINICAL DATA:  70 year old female with weakness, fever, and headache EXAM: CHEST  2 VIEW COMPARISON:  Chest radiograph dated 06/14/2013 FINDINGS: Two views of the  chest do not demonstrate a focal consolidation. There is no pleural effusion or pneumothorax. Stable normal cardiac silhouette. No acute osseous pathology identified. Partially visualized left proximal humeral fixation plate and screws. Multiple surgical clips noted in the left breast area. IMPRESSION: No active cardiopulmonary disease. Electronically Signed   By: Anner Crete M.D.   On: 11/07/2015 19:30   Ct Head Wo Contrast  Result Date: 11/07/2015 CLINICAL DATA:  Acute onset of generalized weakness and fever. Altered mental status. Initial encounter. EXAM: CT HEAD WITHOUT CONTRAST TECHNIQUE: Contiguous axial images were obtained from the base of the skull through the vertex without intravenous contrast. COMPARISON:  None. FINDINGS: Brain: No evidence of acute infarction, hemorrhage, hydrocephalus, extra-axial collection or mass lesion/mass effect. The posterior fossa, including the cerebellum, brainstem and fourth ventricle, is within normal limits. The third and lateral ventricles, and basal ganglia are unremarkable in appearance. The cerebral hemispheres are symmetric in appearance, with normal gray-white differentiation. No mass effect or midline shift is seen. Vascular: No hyperdense vessel or unexpected calcification. Skull: There is no evidence of fracture; visualized osseous structures are unremarkable in appearance. Sinuses/Orbits: The visualized portions of the orbits are within normal limits. The paranasal sinuses and mastoid air cells are well-aerated. Other: No significant soft tissue abnormalities are seen. IMPRESSION: Unremarkable noncontrast CT of the head. Electronically Signed   By: Garald Balding M.D.   On: 11/07/2015 23:29   US Abdomen Complete  Result Date: 11/08/2015 CLINICAL DATA:  Evaluate for portal vein thrombosis. Elevated LFTs. Initial encounter. EXAM: EXAM ULTRASOUND ABDOMEN COMPLETE DUPLEX ULTRASOUND OF LIVER TECHNIQUE: Complete abdominal ultrasound examination was  performed. Color and duplex Doppler ultrasound was also performed to evaluate the hepatic in-flow and out-flow vessels. COMPARISON:  CT of the abdomen and pelvis performed 10/01/2013, and abdominal ultrasound performed 04/09/2014 FINDINGS: Portal Vein Velocities Main: 31.2 cm/sec Right: 15.7  cm/sec Left: 13.3 cm/sec Hepatic Vein Velocities Right: 25.5 cm/sec Middle: 30.7 cm/sec Left: 22.6 cm/sec Hepatic artery velocity: 75.2 cm/sec Splenic vein velocity: 29.0 cm/sec Varices:  Known mild varices are difficult to fully characterize. Ascites: None seen. Vascular structures: There appears to be a left-sided recanalized umbilical vein. Normal hepatopetal flow is  seen within the portal venous system, while normal triphasic antegrade flow is noted within the hepatic veins. There is no evidence of thrombosis. Gallbladder: Status post cholecystectomy.  No retained stones seen. Common bile duct: Diameter: 1.3 cm, likely within normal limits status post cholecystectomy. Liver: No focal lesion identified. Coarsened echotexture likely reflects fatty infiltration. The nodular contour raises concern for mild hepatic cirrhosis. IVC: No abnormality visualized. Pancreas: Not visualized due to overlying bowel gas. Spleen: Enlarged, measuring 18.4 cm in length. Right Kidney: Length: 11.2 cm. Echogenicity within normal limits. No mass or hydronephrosis visualized. Left Kidney: Length: 11.6 cm. Echogenicity within normal limits. No mass or hydronephrosis visualized. Abdominal aorta: No aneurysm visualized. Not characterized distally due to overlying bowel gas. Other Findings: None. IMPRESSION: 1. No evidence of portal vein thrombosis. 2. Changes of mild hepatic cirrhosis, with recanalized left-sided umbilical vein. 3. Normal hepatopetal flow within the portal venous system, and normal triphasic vascular flow within the hepatic veins. 4. Splenomegaly noted. 5. Known mild varices are difficult to fully characterize. Electronically Signed    By: Garald Balding M.D.   On: 11/08/2015 02:27   Dg Pelvis Portable  Result Date: 11/07/2015 CLINICAL DATA:  Status post fall, with concern for pelvic injury. Initial encounter. EXAM: PORTABLE PELVIS 1-2 VIEWS COMPARISON:  CT of the abdomen and pelvis performed 06/03/2013 FINDINGS: There is no evidence of fracture or dislocation. Both femoral heads are seated normally within their respective acetabula. No significant degenerative change is appreciated. The sacroiliac joints are unremarkable in appearance. The visualized bowel gas pattern is grossly unremarkable in appearance. Clips are seen overlying the left inguinal region. IMPRESSION: No evidence of fracture or dislocation. Electronically Signed   By: Garald Balding M.D.   On: 11/07/2015 23:21   [place] Art/ven Flow Abd Pelv Doppler  Result Date: 11/08/2015 CLINICAL DATA:  Evaluate for portal vein thrombosis. Elevated LFTs. Initial encounter. EXAM: EXAM ULTRASOUND ABDOMEN COMPLETE DUPLEX ULTRASOUND OF LIVER TECHNIQUE: Complete abdominal ultrasound examination was performed. Color and duplex Doppler ultrasound was also performed to evaluate the hepatic in-flow and out-flow vessels. COMPARISON:  CT of the abdomen and pelvis performed 10/01/2013, and abdominal ultrasound performed 04/09/2014 FINDINGS: Portal Vein Velocities Main: 31.2 cm/sec Right: 15.7  cm/sec Left: 13.3 cm/sec Hepatic Vein Velocities Right: 25.5 cm/sec Middle: 30.7 cm/sec Left: 22.6 cm/sec Hepatic artery velocity: 75.2 cm/sec Splenic vein velocity: 29.0 cm/sec Varices:  Known mild varices are difficult to fully characterize. Ascites: None seen. Vascular structures: There appears to be a left-sided recanalized umbilical vein. Normal hepatopetal flow is seen within the portal venous system, while normal triphasic antegrade flow is noted within the hepatic veins. There is no evidence of thrombosis. Gallbladder: Status post cholecystectomy.  No retained stones seen. Common bile duct: Diameter: 1.3  cm, likely within normal limits status post cholecystectomy. Liver: No focal lesion identified. Coarsened echotexture likely reflects fatty infiltration. The nodular contour raises concern for mild hepatic cirrhosis. IVC: No abnormality visualized. Pancreas: Not visualized due to overlying bowel gas. Spleen: Enlarged, measuring 18.4 cm in length. Right Kidney: Length: 11.2 cm. Echogenicity within normal limits. No mass or hydronephrosis visualized. Left Kidney: Length: 11.6 cm. Echogenicity within normal limits. No mass or hydronephrosis visualized. Abdominal aorta: No aneurysm visualized. Not characterized distally due to overlying bowel gas. Other Findings: None. IMPRESSION: 1. No evidence of portal vein thrombosis. 2. Changes of mild hepatic cirrhosis, with recanalized left-sided umbilical vein. 3. Normal hepatopetal flow within the portal venous system, and normal triphasic vascular flow within the hepatic  veins. 4. Splenomegaly noted. 5. Known mild varices are difficult to fully characterize. Electronically Signed   By: Garald Balding M.D.   On: 11/08/2015 02:27   Ir Fluoro Guide Cv Line Right  Result Date: 11/08/2015 CLINICAL DATA:  Sepsis, needs durable venous access. EXAM: PICC PLACEMENT WITH ULTRASOUND AND FLUOROSCOPY FLUOROSCOPY TIME:  6 seconds, 1 mGy TECHNIQUE: After written informed consent was obtained, patient was placed in the supine position on angiographic table. Patency of the right basilic vein was confirmed with ultrasound with image documentation. An appropriate skin site was determined. Skin site was marked. Region was prepped using maximum barrier technique including cap and mask, sterile gown, sterile gloves, large sterile sheet, and Chlorhexidine as cutaneous antisepsis. The region was infiltrated locally with 1% lidocaine. Under real-time ultrasound guidance, the right basilic vein was accessed with a 21 gauge micropuncture needle; the needle tip within the vein was confirmed with  ultrasound image documentation. Needle exchanged over a 018 guidewire for a peel-away sheath, through which a 5-French double-lumen power injectable PICC trimmed to 38cm was advanced, positioned with its tip near the cavoatrial junction. Spot chest radiograph confirms appropriate catheter position. Catheter was flushed per protocol and secured externally. The patient tolerated procedure well. COMPLICATIONS: COMPLICATIONS none IMPRESSION: 1. Technically successful five [place] double lumen power injectable PICC placement Electronically Signed   By: Lucrezia Europe M.D.   On: 11/08/2015 16:33   Ir US Guide Vasc Access Right  Result Date: 11/08/2015 CLINICAL DATA:  Sepsis, needs durable venous access. EXAM: PICC PLACEMENT WITH ULTRASOUND AND FLUOROSCOPY FLUOROSCOPY TIME:  6 seconds, 1 mGy TECHNIQUE: After written informed consent was obtained, patient was placed in the supine position on angiographic table. Patency of the right basilic vein was confirmed with ultrasound with image documentation. An appropriate skin site was determined. Skin site was marked. Region was prepped using maximum barrier technique including cap and mask, sterile gown, sterile gloves, large sterile sheet, and Chlorhexidine as cutaneous antisepsis. The region was infiltrated locally with 1% lidocaine. Under real-time ultrasound guidance, the right basilic vein was accessed with a 21 gauge micropuncture needle; the needle tip within the vein was confirmed with ultrasound image documentation. Needle exchanged over a 018 guidewire for a peel-away sheath, through which a 5-French double-lumen power injectable PICC trimmed to 38cm was advanced, positioned with its tip near the cavoatrial junction. Spot chest radiograph confirms appropriate catheter position. Catheter was flushed per protocol and secured externally. The patient tolerated procedure well. COMPLICATIONS: COMPLICATIONS none IMPRESSION: 1. Technically successful five [place] double lumen  power injectable PICC placement Electronically Signed   By: Lucrezia Europe M.D.   On: 11/08/2015 16:33     Scheduled Meds: . buPROPion  150 mg Oral QHS  . [START ON 11/09/2015] buPROPion  75 mg Oral Daily  . lactulose  45 g Oral BID  . levofloxacin (LEVAQUIN) IV  750 mg Intravenous Q24H  . rOPINIRole  2 mg Oral QHS  . sodium chloride flush  10-40 mL Intracatheter Q12H  . vancomycin  750 mg Intravenous Q12H   Continuous Infusions:  PRN Meds: acetaminophen **OR** acetaminophen, sodium chloride flush  Time spent: 30 minutes  Author: Berle Mull, MD [facility] Pager: [phone] 11/08/2015 6:16 PM  If 7PM-7AM, please contact night-coverage at [email or web address], password [facility]

## 2015-11-08 NOTE — Progress Notes (Signed)
  Echocardiogram 2D Echocardiogram has been performed.  [name] 11/08/2015, 3:15 PM

## 2015-11-08 NOTE — Progress Notes (Signed)
*  Preliminary Results* Right lower extremity venous duplex completed. Right lower extremity is negative for deep vein thrombosis. There is no evidence of right Baker's cyst.  11/08/2015 11:07 AM  Maudry Mayhew, BS, RVT, RDCS, RDMS

## 2015-11-08 NOTE — Procedures (Signed)
R DL PICC placement No complication No blood loss. See complete dictation in [facility].

## 2015-11-08 NOTE — Progress Notes (Signed)
CRITICAL VALUE ALERT  Critical value received:  Potassium 2.8, lactic acid 3.3, troponin 0.31  Date of notification: 09/052017  Time of notification:  03:30-04:30  Critical value read back:yes  Nurse who received alert:  Benjamin Stain RN  MD notified (1st page):  Forrest Moron NP  Time of first page:  Text paged several results  MD notified (2nd page):  Time of second page:  Responding MD:  Forrest Moron NP  Time MD responded:

## 2015-11-08 NOTE — Progress Notes (Signed)
I attempted RT arm PICC insertion without success.    Accessed basilic vein twice without a problem however was unable to thread the PICC beyond the subclavian area.   On 2nd attempt went above where I tried before via the basilic vein with same result.   I accessed cephalic vein without difficulty however had the same issue with the PICC not threading beyond the subclavian area.  Dr. Posey Pronto in as I was cleaning up and made aware of situation.   Pt being referred to Interventional Radiology.   I notified IR and spoke with Tiffany giving her the needed information and my attempts and the difficulty I had.   She said,  "We might be able to get to her today but probably be tomorrow".    I made the primary RN aware also.  Dr. Posey Pronto informed me he would put in the order for IR to insert PICC.

## 2015-11-08 NOTE — Progress Notes (Signed)
Peripherally Inserted Central Catheter/Midline Placement  The IV Nurse has discussed with the patient and/or persons authorized to consent for the patient, the purpose of this procedure and the potential benefits and risks involved with this procedure.  The benefits include less needle sticks, lab draws from the catheter and patient may be discharged home with the catheter.  Risks include, but not limited to, infection, bleeding, blood clot (thrombus formation), and puncture of an artery; nerve damage and irregular heat beat.  Alternatives to this procedure were also discussed.  PICC/Midline Placement Documentation        Henderson Baltimore 11/08/2015, 1:28 PM Initials on consent witnessed by myself.  Pt alert and oriented X3.   Asking appropriate questions and conversing appropriately to situation.

## 2015-11-09 LAB — MAGNESIUM: MAGNESIUM: 2 mg/dL (ref 1.7–2.4)

## 2015-11-09 LAB — GLUCOSE, CAPILLARY
GLUCOSE-CAPILLARY: 106 mg/dL — AB (ref 65–99)
Glucose-Capillary: 92 mg/dL (ref 65–99)

## 2015-11-09 LAB — CBC WITH DIFFERENTIAL/PLATELET
BASOS ABS: 0 10*3/uL (ref 0.0–0.1)
BASOS PCT: 0 %
EOS PCT: 0 %
Eosinophils Absolute: 0 10*3/uL (ref 0.0–0.7)
HCT: 30.9 % — ABNORMAL LOW (ref 36.0–46.0)
Hemoglobin: 10.6 g/dL — ABNORMAL LOW (ref 12.0–15.0)
Lymphocytes Relative: 13 %
Lymphs Abs: 1 10*3/uL (ref 0.7–4.0)
MCH: 33.5 pg (ref 26.0–34.0)
MCHC: 34.3 g/dL (ref 30.0–36.0)
MCV: 97.8 fL (ref 78.0–100.0)
MONO ABS: 0.8 10*3/uL (ref 0.1–1.0)
Monocytes Relative: 10 %
Neutro Abs: 5.7 10*3/uL (ref 1.7–7.7)
Neutrophils Relative %: 76 %
PLATELETS: 27 10*3/uL — AB (ref 150–400)
RBC: 3.16 MIL/uL — ABNORMAL LOW (ref 3.87–5.11)
RDW: 17 % — AB (ref 11.5–15.5)
WBC: 7.5 10*3/uL (ref 4.0–10.5)

## 2015-11-09 LAB — COMPREHENSIVE METABOLIC PANEL
ALBUMIN: 2.2 g/dL — AB (ref 3.5–5.0)
ALT: 43 U/L (ref 14–54)
AST: 104 U/L — AB (ref 15–41)
Alkaline Phosphatase: 107 U/L (ref 38–126)
Anion gap: 3 — ABNORMAL LOW (ref 5–15)
BUN: 33 mg/dL — AB (ref 6–20)
CHLORIDE: 111 mmol/L (ref 101–111)
CO2: 24 mmol/L (ref 22–32)
Calcium: 8.1 mg/dL — ABNORMAL LOW (ref 8.9–10.3)
Creatinine, Ser: 0.88 mg/dL (ref 0.44–1.00)
GFR calc Af Amer: >60 mL/min (ref >60)
Glucose, Bld: 99 mg/dL (ref 65–99)
POTASSIUM: 3.3 mmol/L — AB (ref 3.5–5.1)
Sodium: 138 mmol/L (ref 135–145)
Total Bilirubin: 4.1 mg/dL — ABNORMAL HIGH (ref 0.3–1.2)
Total Protein: 4.8 g/dL — ABNORMAL LOW (ref 6.5–8.1)

## 2015-11-09 LAB — CBC AND DIFFERENTIAL
HCT: 31 % — AB (ref 36–46)
HEMOGLOBIN: 10.6 g/dL — AB (ref 12.0–16.0)
Platelets: 27 10*3/uL — AB (ref 150–399)
WBC: 7.5 10*3/mL

## 2015-11-09 LAB — URINE CULTURE: CULTURE: NO GROWTH

## 2015-11-09 LAB — CK: CK TOTAL: 659 U/L — AB (ref 38–234)

## 2015-11-09 LAB — BASIC METABOLIC PANEL
BUN: 33 mg/dL — AB (ref 4–21)
Creatinine: 0.9 mg/dL (ref 0.5–1.1)
Glucose: 99 mg/dL
POTASSIUM: 3.3 mmol/L — AB (ref 3.4–5.3)
SODIUM: 138 mmol/L (ref 137–147)

## 2015-11-09 LAB — PROTIME-INR
INR: 2.13
Prothrombin Time: 24.2 seconds — ABNORMAL HIGH (ref 11.4–15.2)

## 2015-11-09 LAB — HEPATIC FUNCTION PANEL
ALK PHOS: 107 U/L (ref 25–125)
ALT: 43 U/L — AB (ref 7–35)
BILIRUBIN, TOTAL: 4.1 mg/dL

## 2015-11-09 LAB — TROPONIN I
TROPONIN I: 0.27 ng/mL — AB (ref <0.03)
TROPONIN I: 0.24 ng/mL — AB (ref <0.03)

## 2015-11-09 LAB — VANCOMYCIN, TROUGH: Vancomycin Tr: 10 ug/mL — ABNORMAL LOW (ref 15–20)

## 2015-11-09 MED ORDER — FLUCONAZOLE 100 MG PO TABS
100.0000 mg | ORAL_TABLET | Freq: Once | ORAL | Status: AC
  Administered 2015-11-09: 100 mg via ORAL
  Filled 2015-11-09: qty 1

## 2015-11-09 MED ORDER — OXYCODONE HCL 5 MG PO TABS
5.0000 mg | ORAL_TABLET | ORAL | Status: DC | PRN
  Administered 2015-11-09 – 2015-11-13 (×17): 5 mg via ORAL
  Filled 2015-11-09 (×18): qty 1

## 2015-11-09 MED ORDER — RIFAXIMIN 550 MG PO TABS
550.0000 mg | ORAL_TABLET | Freq: Two times a day (BID) | ORAL | Status: DC
  Administered 2015-11-09 – 2015-11-13 (×9): 550 mg via ORAL
  Filled 2015-11-09 (×9): qty 1

## 2015-11-09 MED ORDER — POTASSIUM CHLORIDE CRYS ER 20 MEQ PO TBCR
40.0000 meq | EXTENDED_RELEASE_TABLET | Freq: Once | ORAL | Status: AC
  Administered 2015-11-09: 40 meq via ORAL
  Filled 2015-11-09: qty 2

## 2015-11-09 MED ORDER — VANCOMYCIN HCL IN DEXTROSE 1-5 GM/200ML-% IV SOLN
1000.0000 mg | Freq: Two times a day (BID) | INTRAVENOUS | Status: DC
  Administered 2015-11-10: 1000 mg via INTRAVENOUS
  Filled 2015-11-09: qty 200

## 2015-11-09 NOTE — Progress Notes (Addendum)
Pharmacy Antibiotic Note  [name] is a 70 y.o. female admitted on 11/07/2015 with weakness, fever, and headache.  Pharmacy has been consulted for antibiotic dosing for sepsis.  Noted allergy to PCNs (anaphylaxis, itching, swelling) - no records in Epic of cephalosporin use.    Today, 11/09/2015  -2233 VT=10 mg/L > per RN a small amount was infused b/f IV team came to draw lab.  Line was flushed and lab drawn.  -Will increase Vancomycin to 1Gm IV q12h - Afebrile x 24hr - WBC normal - SCr improved, CrCl ~80 ml/min  Plan:  Increase Vancomycin to 1 Gm IV q12h  Continue Levaquin 750mg  IV q24h for CrCl > 50 ml/min  Consider narrowing spectrum to target streptococcus if cellulitis is non-purulent and sepsis is clinically resolving.    Height: 5\' 8"  (172.7 cm) Weight: 235 lb 7.2 oz (106.8 kg) IBW/kg (Calculated) : 63.9  Temp (24hrs), Avg:98.2 F (36.8 C), Min:98 F (36.7 C), Max:98.4 F (36.9 C)   Recent Labs Lab 11/07/15 1910 11/07/15 1918 11/07/15 2227 11/07/15 2238 11/08/15 0215 11/08/15 1715 11/09/15 0430 11/09/15 2233  WBC 7.9  --   --   --  7.5  --  7.5  --   CREATININE 1.15*  --   --   --  1.05* 0.95 0.88  --   LATICACIDVEN  --  4.16* 2.9* 3.20* 3.3*  --   --   --   VANCOTROUGH  --   --   --   --   --   --   --  10*    Estimated Creatinine Clearance: 79.4 mL/min (by C-G formula based on SCr of 0.88 mg/dL).    Allergies  Allergen Reactions  . Ondansetron Hcl Other (See Comments)    Other Reaction: High B/P  . Penicillins Anaphylaxis, Itching and Swelling    Has patient had a PCN reaction causing immediate rash, facial/tongue/throat swelling, SOB or lightheadedness with hypotension: Yes Has patient had a PCN reaction causing severe rash involving mucus membranes or skin necrosis: No Has patient had a PCN reaction that required hospitalization No Has patient had a PCN reaction occurring within the last 10 years: No If all of the above answers are "NO", then may  proceed with Cephalosporin use.   . Aspirin     Has liver disease  . Midazolam Hcl Other (See Comments)    Daughter has allergy, doctors advised to warn against using.Marland KitchenMarland KitchenPropofol is okay to use.  . Niacin And Related Itching    Feels like skin is on fire    Antimicrobials this admission: 9/4 Flagyl x 1 9/4 Aztreonam >> 9/5 9/4 Vancomycin >>  9/4 Levaquin >> 9/6 Fluconazole >>   Dose adjustments this admission: 9/6 VT at 2130 = 10 mg/L on Vanc 750mg  IV q12h   Microbiology results: 9/4 BCx: ngtd 9/4 UCx: NGF 9/4 MRSA PCR: (-)  Thank you for allowing pharmacy to be a part of this patient's care.   Dorrene German 11/09/2015, 11:33 PM

## 2015-11-09 NOTE — Progress Notes (Signed)
PROGRESS NOTE    [name]  [id number] DOB: [birth year] DOA: 11/07/2015 PCP: Sheela Stack, MD   Outpatient Specialists:     Brief Narrative:    [name] is a 70 y.o. female with nonalcoholic liver cirrhosis, history of PSVT, chronic thrombocytopenia was brought to the ER after patient was found to be getting weak over the last few days. As per patient's daughter patient was doing fine 3 days ago but got weak since then. Patient also found to be lethargic. Patient denies any chest pain shortness of breath nausea vomiting or diarrhea. Has had some nonspecific abdominal discomfort around the epigastric area. Abdomen appears nontender on exam. Patient also has redness and swelling in the right lower extremity concerning for cellulitis. Patient is febrile with lactate elevated and patient looking drowsy and confused. Patient bilirubin also has increased from baseline but ammonia levels are normal. Has had a recent fall 2 weeks ago. CT head and x-ray pelvis are pending. Patient does have some pain in the right lower extremity on moving. Patient is being admitted for sepsis most likely source would be right leg cellulitis.   Assessment & Plan:   Principal Problem:   Sepsis ([place]) Active Problems:   Thrombocytopenia, unspecified (HCC)   Cirrhosis, non-alcoholic (HCC)   HTN (hypertension)   Cellulitis of right lower extremity   Sepsis ([place]) Secondary to cellulitis. Right lower extremity was red and swollen warm to touch and tender-- improved  -DVT ruled out on ultrasound Doppler. Continue IV vancomycin and Levaquin.  Fall. Patient presented with a suspected fall. Neurologically the patient does not have any focal deficit.  X-ray of pelvis and CT of the head is unremarkable. This is likely secondary to medication as well as hepatic encephalopathy. PT Eval  Suspected hepatic encephalopathy. D/c lactulose due to diarrhea -add  rifaxamim  Hypokalemia. Replace  Rhabdomyolysis. Mild elevation of CK. Unclear of the etiology. Likely from immobilization. No evidence of renal damage. continue with hydration.  Elevated troponin. Echocardiogram does not show any evidence of acute wall motion abnormality. Ejection fraction is also normal. Next on this is likely in the setting of demand ischemia.   Cirrhosis of the liver. Nonalcoholic. Coagulopathy. Vitamin K given.   Restless leg syndrome. Resume Requip.   Poor IV access.  IR guided PICC line was inserted.   DVT prophylaxis:  SCD's  Code Status: Full Code   Family Communication: patient  Disposition Plan:  tx to med surge floor   Consultants:        Subjective: Feeling better " I know my name"  Objective: Vitals:   11/09/15 0800 11/09/15 0801 11/09/15 1000 11/09/15 1200  BP: (!) 119/46  (!) 139/48 (!) 163/71  Pulse: 80  79 75  Resp: (!) 22  (!) 25 14  Temp:  98.2 F (36.8 C)  98.1 F (36.7 C)  TempSrc:  Oral  Oral  SpO2: 98%  98% 98%  Weight:      Height:        Intake/Output Summary (Last 24 hours) at 11/09/15 1233 Last data filed at 11/09/15 1200  Gross per 24 hour  Intake              830 ml  Output             1621 ml  Net             -791 ml   Filed Weights   11/07/15 1949 11/08/15 0020 11/09/15 0500  Weight: 90.7  kg (200 lb) 97.1 kg (214 lb 1.1 oz) 106.8 kg (235 lb 7.2 oz)    Examination:  General exam: Appears calm and comfortable  Respiratory system: Clear to auscultation. Respiratory effort normal. Cardiovascular system: S1 & S2 heard, RRR. No JVD, murmurs, rubs, gallops or clicks. + edema Gastrointestinal system: Abdomen is nondistended, soft and nontender. No organomegaly or masses felt. Normal bowel sounds heard. Central nervous system: Alert and oriented. No focal neurological deficits. Extremities: + edema b/l-- right less with some redness but appears improved Psychiatry: Judgement and  insight appear normal. Mood & affect appropriate.     Data Reviewed: I have personally reviewed following labs and imaging studies  CBC:  Recent Labs Lab 11/07/15 1910 11/08/15 0215 11/09/15 0430  WBC 7.9 7.5 7.5  NEUTROABS 7.2 6.5 5.7  HGB 12.8 11.3* 10.6*  HCT 36.7 32.5* 30.9*  MCV 97.1 98.2 97.8  PLT 34* 34* 27*   Basic Metabolic Panel:  Recent Labs Lab 11/07/15 1910 11/08/15 0215 11/08/15 0316 11/08/15 1715 11/09/15 0430  NA 139 141  --  138 138  K 3.2* 2.8*  --  2.8* 3.3*  CL 104 111  --  111 111  CO2 25 23  --  24 24  GLUCOSE 91 88  --  122* 99  BUN 26* 28*  --  32* 33*  CREATININE 1.15* 1.05*  --  0.95 0.88  CALCIUM 8.6* 7.7*  --  7.6* 8.1*  MG  --   --  1.4* 1.9 2.0   GFR: Estimated Creatinine Clearance: 79.4 mL/min (by C-G formula based on SCr of 0.88 mg/dL). Liver Function Tests:  Recent Labs Lab 11/07/15 1910 11/08/15 0215 11/09/15 0430  AST 64* 95* 104*  ALT 37 40 43  ALKPHOS 159* 112 107  BILITOT 8.2* 5.2* 4.1*  PROT 6.3* 5.0* 4.8*  ALBUMIN 2.9* 2.3* 2.2*   No results for input(s): LIPASE, AMYLASE in the last 168 hours.  Recent Labs Lab 11/07/15 1940 11/08/15 0215  AMMONIA 34 53*   Coagulation Profile:  Recent Labs Lab 11/07/15 1940 11/09/15 0430  INR 2.00 2.13   Cardiac Enzymes:  Recent Labs Lab 11/08/15 0200 11/08/15 0316 11/08/15 1716 11/08/15 2316 11/09/15 0430  CKTOTAL  --  1,902*  --   --  659*  TROPONINI 0.31*  --  0.24* 0.27* 0.24*   BNP (last 3 results) No results for input(s): PROBNP in the last 8760 hours. HbA1C: No results for input(s): HGBA1C in the last 72 hours. CBG:  Recent Labs Lab 11/08/15 0541 11/08/15 1142 11/08/15 1839 11/08/15 2342 11/09/15 0643  GLUCAP 71 119* 95 106* 92   Lipid Profile: No results for input(s): CHOL, HDL, LDLCALC, TRIG, CHOLHDL, LDLDIRECT in the last 72 hours. Thyroid Function Tests:  Recent Labs  11/08/15 1730  TSH 2.353   Anemia Panel:  Recent Labs   11/08/15 1730  VITAMINB12 625  FOLATE 10.9   Urine analysis:    Component Value Date/Time   COLORURINE ORANGE (A) 11/07/2015 2202   APPEARANCEUR CLOUDY (A) 11/07/2015 2202   LABSPEC 1.028 11/07/2015 2202   PHURINE 5.0 11/07/2015 2202   GLUCOSEU NEGATIVE 11/07/2015 2202   HGBUR SMALL (A) 11/07/2015 2202   BILIRUBINUR SMALL (A) 11/07/2015 2202   KETONESUR NEGATIVE 11/07/2015 2202   PROTEINUR NEGATIVE 11/07/2015 2202   UROBILINOGEN 1.0 04/18/2012 1030   NITRITE POSITIVE (A) 11/07/2015 2202   LEUKOCYTESUR SMALL (A) 11/07/2015 2202     ) Recent Results (from the past 240 hour(s))  Culture,  blood (Routine x 2)     Status: None (Preliminary result)   Collection Time: 11/07/15  7:05 PM  Result Value Ref Range Status   Specimen Description BLOOD BLOOD RIGHT ARM  Final   Special Requests NONE  Final   Culture   Final    NO GROWTH < 24 HOURS Performed at [facility]    Report Status PENDING  Incomplete  Culture, blood (Routine x 2)     Status: None (Preliminary result)   Collection Time: 11/07/15  7:40 PM  Result Value Ref Range Status   Specimen Description BLOOD LEFT ARM  Final   Special Requests NONE  Final   Culture   Final    NO GROWTH < 24 HOURS Performed at [facility]    Report Status PENDING  Incomplete  Urine culture     Status: None   Collection Time: 11/07/15 10:02 PM  Result Value Ref Range Status   Specimen Description URINE, CLEAN CATCH  Final   Special Requests NONE  Final   Culture NO GROWTH Performed at [facility]   Final   Report Status 11/09/2015 FINAL  Final  MRSA PCR Screening     Status: None   Collection Time: 11/08/15 12:11 AM  Result Value Ref Range Status   MRSA by PCR NEGATIVE NEGATIVE Final    Comment:        The GeneXpert MRSA Assay (FDA approved for NASAL specimens only), is one component of a comprehensive MRSA colonization surveillance program. It is not intended to diagnose MRSA infection nor to guide  or monitor treatment for MRSA infections.       Anti-infectives    Start     Dose/Rate Route Frequency Ordered Stop   11/09/15 1000  rifaximin (XIFAXAN) tablet 550 mg     550 mg Oral 2 times daily 11/09/15 0829     11/09/15 1000  fluconazole (DIFLUCAN) tablet 100 mg     100 mg Oral  Once 11/09/15 0829 11/09/15 0928   11/08/15 2200  levofloxacin (LEVAQUIN) IVPB 750 mg     750 mg 100 mL/hr over 90 Minutes Intravenous Every 24 hours 11/07/15 2314     11/08/15 1000  vancomycin (VANCOCIN) IVPB 750 mg/150 ml premix     750 mg 150 mL/hr over 60 Minutes Intravenous Every 12 hours 11/07/15 2009     11/08/15 0400  aztreonam (AZACTAM) 1 g in dextrose 5 % 50 mL IVPB  Status:  Discontinued     1 g 100 mL/hr over 30 Minutes Intravenous Every 8 hours 11/07/15 2010 11/08/15 1716   11/08/15 0400  metroNIDAZOLE (FLAGYL) IVPB 500 mg  Status:  Discontinued     500 mg 100 mL/hr over 60 Minutes Intravenous Every 8 hours 11/07/15 2010 11/07/15 2250   11/07/15 2300  levofloxacin (LEVAQUIN) IVPB 750 mg     750 mg 100 mL/hr over 90 Minutes Intravenous  Once 11/07/15 2250 11/08/15 0046   11/07/15 2300  aztreonam (AZACTAM) 2 g in dextrose 5 % 50 mL IVPB  Status:  Discontinued     2 g 100 mL/hr over 30 Minutes Intravenous  Once 11/07/15 2250 11/07/15 2252   11/07/15 2300  vancomycin (VANCOCIN) IVPB 1000 mg/200 mL premix  Status:  Discontinued     1,000 mg 200 mL/hr over 60 Minutes Intravenous  Once 11/07/15 2250 11/07/15 2252   11/07/15 2015  vancomycin (VANCOCIN) 1,500 mg in sodium chloride 0.9 % 500 mL IVPB  1,500 mg 250 mL/hr over 120 Minutes Intravenous STAT 11/07/15 2009 11/07/15 2219   11/07/15 1945  aztreonam (AZACTAM) 2 g in dextrose 5 % 50 mL IVPB     2 g 100 mL/hr over 30 Minutes Intravenous  Once 11/07/15 1930 11/07/15 2024   11/07/15 1945  metroNIDAZOLE (FLAGYL) IVPB 500 mg     500 mg 100 mL/hr over 60 Minutes Intravenous  Once 11/07/15 1930 11/07/15 2143   11/07/15 1930  vancomycin  (VANCOCIN) IVPB 1000 mg/200 mL premix  Status:  Discontinued     1,000 mg 200 mL/hr over 60 Minutes Intravenous  Once 11/07/15 1929 11/07/15 2108       Radiology Studies: Dg Chest 2 View  Result Date: 11/07/2015 CLINICAL DATA:  70 year old female with weakness, fever, and headache EXAM: CHEST  2 VIEW COMPARISON:  Chest radiograph dated 06/14/2013 FINDINGS: Two views of the chest do not demonstrate a focal consolidation. There is no pleural effusion or pneumothorax. Stable normal cardiac silhouette. No acute osseous pathology identified. Partially visualized left proximal humeral fixation plate and screws. Multiple surgical clips noted in the left breast area. IMPRESSION: No active cardiopulmonary disease. Electronically Signed   By: Anner Crete M.D.   On: 11/07/2015 19:30   Ct Head Wo Contrast  Result Date: 11/07/2015 CLINICAL DATA:  Acute onset of generalized weakness and fever. Altered mental status. Initial encounter. EXAM: CT HEAD WITHOUT CONTRAST TECHNIQUE: Contiguous axial images were obtained from the base of the skull through the vertex without intravenous contrast. COMPARISON:  None. FINDINGS: Brain: No evidence of acute infarction, hemorrhage, hydrocephalus, extra-axial collection or mass lesion/mass effect. The posterior fossa, including the cerebellum, brainstem and fourth ventricle, is within normal limits. The third and lateral ventricles, and basal ganglia are unremarkable in appearance. The cerebral hemispheres are symmetric in appearance, with normal gray-white differentiation. No mass effect or midline shift is seen. Vascular: No hyperdense vessel or unexpected calcification. Skull: There is no evidence of fracture; visualized osseous structures are unremarkable in appearance. Sinuses/Orbits: The visualized portions of the orbits are within normal limits. The paranasal sinuses and mastoid air cells are well-aerated. Other: No significant soft tissue abnormalities are seen.  IMPRESSION: Unremarkable noncontrast CT of the head. Electronically Signed   By: Garald Balding M.D.   On: 11/07/2015 23:29   US Abdomen Complete  Result Date: 11/08/2015 CLINICAL DATA:  Evaluate for portal vein thrombosis. Elevated LFTs. Initial encounter. EXAM: EXAM ULTRASOUND ABDOMEN COMPLETE DUPLEX ULTRASOUND OF LIVER TECHNIQUE: Complete abdominal ultrasound examination was performed. Color and duplex Doppler ultrasound was also performed to evaluate the hepatic in-flow and out-flow vessels. COMPARISON:  CT of the abdomen and pelvis performed 10/01/2013, and abdominal ultrasound performed 04/09/2014 FINDINGS: Portal Vein Velocities Main: 31.2 cm/sec Right: 15.7  cm/sec Left: 13.3 cm/sec Hepatic Vein Velocities Right: 25.5 cm/sec Middle: 30.7 cm/sec Left: 22.6 cm/sec Hepatic artery velocity: 75.2 cm/sec Splenic vein velocity: 29.0 cm/sec Varices:  Known mild varices are difficult to fully characterize. Ascites: None seen. Vascular structures: There appears to be a left-sided recanalized umbilical vein. Normal hepatopetal flow is seen within the portal venous system, while normal triphasic antegrade flow is noted within the hepatic veins. There is no evidence of thrombosis. Gallbladder: Status post cholecystectomy.  No retained stones seen. Common bile duct: Diameter: 1.3 cm, likely within normal limits status post cholecystectomy. Liver: No focal lesion identified. Coarsened echotexture likely reflects fatty infiltration. The nodular contour raises concern for mild hepatic cirrhosis. IVC: No abnormality visualized. Pancreas: Not visualized due to  overlying bowel gas. Spleen: Enlarged, measuring 18.4 cm in length. Right Kidney: Length: 11.2 cm. Echogenicity within normal limits. No mass or hydronephrosis visualized. Left Kidney: Length: 11.6 cm. Echogenicity within normal limits. No mass or hydronephrosis visualized. Abdominal aorta: No aneurysm visualized. Not characterized distally due to overlying bowel gas.  Other Findings: None. IMPRESSION: 1. No evidence of portal vein thrombosis. 2. Changes of mild hepatic cirrhosis, with recanalized left-sided umbilical vein. 3. Normal hepatopetal flow within the portal venous system, and normal triphasic vascular flow within the hepatic veins. 4. Splenomegaly noted. 5. Known mild varices are difficult to fully characterize. Electronically Signed   By: Garald Balding M.D.   On: 11/08/2015 02:27   Dg Pelvis Portable  Result Date: 11/07/2015 CLINICAL DATA:  Status post fall, with concern for pelvic injury. Initial encounter. EXAM: PORTABLE PELVIS 1-2 VIEWS COMPARISON:  CT of the abdomen and pelvis performed 06/03/2013 FINDINGS: There is no evidence of fracture or dislocation. Both femoral heads are seated normally within their respective acetabula. No significant degenerative change is appreciated. The sacroiliac joints are unremarkable in appearance. The visualized bowel gas pattern is grossly unremarkable in appearance. Clips are seen overlying the left inguinal region. IMPRESSION: No evidence of fracture or dislocation. Electronically Signed   By: Garald Balding M.D.   On: 11/07/2015 23:21   [place] Art/ven Flow Abd Pelv Doppler  Result Date: 11/08/2015 CLINICAL DATA:  Evaluate for portal vein thrombosis. Elevated LFTs. Initial encounter. EXAM: EXAM ULTRASOUND ABDOMEN COMPLETE DUPLEX ULTRASOUND OF LIVER TECHNIQUE: Complete abdominal ultrasound examination was performed. Color and duplex Doppler ultrasound was also performed to evaluate the hepatic in-flow and out-flow vessels. COMPARISON:  CT of the abdomen and pelvis performed 10/01/2013, and abdominal ultrasound performed 04/09/2014 FINDINGS: Portal Vein Velocities Main: 31.2 cm/sec Right: 15.7  cm/sec Left: 13.3 cm/sec Hepatic Vein Velocities Right: 25.5 cm/sec Middle: 30.7 cm/sec Left: 22.6 cm/sec Hepatic artery velocity: 75.2 cm/sec Splenic vein velocity: 29.0 cm/sec Varices:  Known mild varices are difficult to fully  characterize. Ascites: None seen. Vascular structures: There appears to be a left-sided recanalized umbilical vein. Normal hepatopetal flow is seen within the portal venous system, while normal triphasic antegrade flow is noted within the hepatic veins. There is no evidence of thrombosis. Gallbladder: Status post cholecystectomy.  No retained stones seen. Common bile duct: Diameter: 1.3 cm, likely within normal limits status post cholecystectomy. Liver: No focal lesion identified. Coarsened echotexture likely reflects fatty infiltration. The nodular contour raises concern for mild hepatic cirrhosis. IVC: No abnormality visualized. Pancreas: Not visualized due to overlying bowel gas. Spleen: Enlarged, measuring 18.4 cm in length. Right Kidney: Length: 11.2 cm. Echogenicity within normal limits. No mass or hydronephrosis visualized. Left Kidney: Length: 11.6 cm. Echogenicity within normal limits. No mass or hydronephrosis visualized. Abdominal aorta: No aneurysm visualized. Not characterized distally due to overlying bowel gas. Other Findings: None. IMPRESSION: 1. No evidence of portal vein thrombosis. 2. Changes of mild hepatic cirrhosis, with recanalized left-sided umbilical vein. 3. Normal hepatopetal flow within the portal venous system, and normal triphasic vascular flow within the hepatic veins. 4. Splenomegaly noted. 5. Known mild varices are difficult to fully characterize. Electronically Signed   By: Garald Balding M.D.   On: 11/08/2015 02:27   Ir Fluoro Guide Cv Line Right  Result Date: 11/08/2015 CLINICAL DATA:  Sepsis, needs durable venous access. EXAM: PICC PLACEMENT WITH ULTRASOUND AND FLUOROSCOPY FLUOROSCOPY TIME:  6 seconds, 1 mGy TECHNIQUE: After written informed consent was obtained, patient was placed in the supine  position on angiographic table. Patency of the right basilic vein was confirmed with ultrasound with image documentation. An appropriate skin site was determined. Skin site was marked.  Region was prepped using maximum barrier technique including cap and mask, sterile gown, sterile gloves, large sterile sheet, and Chlorhexidine as cutaneous antisepsis. The region was infiltrated locally with 1% lidocaine. Under real-time ultrasound guidance, the right basilic vein was accessed with a 21 gauge micropuncture needle; the needle tip within the vein was confirmed with ultrasound image documentation. Needle exchanged over a 018 guidewire for a peel-away sheath, through which a 5-French double-lumen power injectable PICC trimmed to 38cm was advanced, positioned with its tip near the cavoatrial junction. Spot chest radiograph confirms appropriate catheter position. Catheter was flushed per protocol and secured externally. The patient tolerated procedure well. COMPLICATIONS: COMPLICATIONS none IMPRESSION: 1. Technically successful five [place] double lumen power injectable PICC placement Electronically Signed   By: Lucrezia Europe M.D.   On: 11/08/2015 16:33   Ir US Guide Vasc Access Right  Result Date: 11/08/2015 CLINICAL DATA:  Sepsis, needs durable venous access. EXAM: PICC PLACEMENT WITH ULTRASOUND AND FLUOROSCOPY FLUOROSCOPY TIME:  6 seconds, 1 mGy TECHNIQUE: After written informed consent was obtained, patient was placed in the supine position on angiographic table. Patency of the right basilic vein was confirmed with ultrasound with image documentation. An appropriate skin site was determined. Skin site was marked. Region was prepped using maximum barrier technique including cap and mask, sterile gown, sterile gloves, large sterile sheet, and Chlorhexidine as cutaneous antisepsis. The region was infiltrated locally with 1% lidocaine. Under real-time ultrasound guidance, the right basilic vein was accessed with a 21 gauge micropuncture needle; the needle tip within the vein was confirmed with ultrasound image documentation. Needle exchanged over a 018 guidewire for a peel-away sheath, through which a  5-French double-lumen power injectable PICC trimmed to 38cm was advanced, positioned with its tip near the cavoatrial junction. Spot chest radiograph confirms appropriate catheter position. Catheter was flushed per protocol and secured externally. The patient tolerated procedure well. COMPLICATIONS: COMPLICATIONS none IMPRESSION: 1. Technically successful five [place] double lumen power injectable PICC placement Electronically Signed   By: Lucrezia Europe M.D.   On: 11/08/2015 16:33        Scheduled Meds: . buPROPion  150 mg Oral QHS  . buPROPion  75 mg Oral Daily  . levofloxacin (LEVAQUIN) IV  750 mg Intravenous Q24H  . rifaximin  550 mg Oral BID  . rOPINIRole  2 mg Oral QHS  . sodium chloride flush  10-40 mL Intracatheter Q12H  . vancomycin  750 mg Intravenous Q12H   Continuous Infusions:    LOS: 2 days    Time spent: 35 min    Powell, DO [facility] Pager [phone]  If 7PM-7AM, please contact night-coverage [email or web address] Password [facility] 11/09/2015, 12:33 PM

## 2015-11-09 NOTE — Progress Notes (Signed)
CRITICAL VALUE ALERT  Critical value received:  PLT 27  Date of notification:  11/09/2015   Time of notification:  6:59 AM   Critical value read back:Yes.    Nurse who received alert:  Jeanie Sewer   MD notified (1st page):  NP Baltazar Najjar  Time of first page:  7:00 AM   MD notified (2nd page):  Time of second page:  Responding MD:   Time MD responded:

## 2015-11-09 NOTE — Progress Notes (Signed)
Pharmacy Antibiotic Note  [name] is a 70 y.o. female admitted on 11/07/2015 with weakness, fever, and headache.  Pharmacy has been consulted for antibiotic dosing for sepsis.  Noted allergy to PCNs (anaphylaxis, itching, swelling) - no records in Epic of cephalosporin use.    Today, 11/09/2015 - Day #3 Vancomycin 1500g x1 (9/3), then 750mg  IV q12h and Levaquin 750mg  IV q24h - Afebrile x 24hr - WBC normal - SCr improved, CrCl ~80 ml/min  Plan:  Check vancomycin trough tonight at 21:30 prior to 5th total dose  Continue Levaquin 750mg  IV q24h for CrCl > 50 ml/min  Consider narrowing spectrum to target streptococcus if cellulitis is non-purulent and sepsis is clinically resolving.    Height: 5\' 8"  (172.7 cm) Weight: 235 lb 7.2 oz (106.8 kg) IBW/kg (Calculated) : 63.9  Temp (24hrs), Avg:98.5 F (36.9 C), Min:98.2 F (36.8 C), Max:98.9 F (37.2 C)   Recent Labs Lab 11/07/15 1910 11/07/15 1918 11/07/15 2227 11/07/15 2238 11/08/15 0215 11/08/15 1715 11/09/15 0430  WBC 7.9  --   --   --  7.5  --  7.5  CREATININE 1.15*  --   --   --  1.05* 0.95 0.88  LATICACIDVEN  --  4.16* 2.9* 3.20* 3.3*  --   --     Estimated Creatinine Clearance: 79.4 mL/min (by C-G formula based on SCr of 0.88 mg/dL).    Allergies  Allergen Reactions  . Ondansetron Hcl Other (See Comments)    Other Reaction: High B/P  . Penicillins Anaphylaxis, Itching and Swelling    Has patient had a PCN reaction causing immediate rash, facial/tongue/throat swelling, SOB or lightheadedness with hypotension: Yes Has patient had a PCN reaction causing severe rash involving mucus membranes or skin necrosis: No Has patient had a PCN reaction that required hospitalization No Has patient had a PCN reaction occurring within the last 10 years: No If all of the above answers are "NO", then may proceed with Cephalosporin use.   . Aspirin     Has liver disease  . Midazolam Hcl Other (See Comments)    Daughter has  allergy, doctors advised to warn against using.Marland KitchenMarland KitchenPropofol is okay to use.  . Niacin And Related Itching    Feels like skin is on fire    Antimicrobials this admission: 9/4 Flagyl x 1 9/4 Aztreonam >> 9/5 9/4 Vancomycin >>  9/4 Levaquin >> 9/6 Fluconazole >>   Dose adjustments this admission: 9/6 VT at 2130 = ____ before 5th total dose   Microbiology results: 9/4 BCx: ngtd 9/4 UCx: NGF 9/4 MRSA PCR: (-)  Thank you for allowing pharmacy to be a part of this patient's care.  Peggyann Juba, PharmD, BCPS Pager: [phone] 11/09/2015, 10:01 AM

## 2015-11-09 NOTE — Progress Notes (Signed)
Pt has refused CPAP for the night, pt's RN aware.  RT to monitor and assess as needed.

## 2015-11-09 NOTE — Progress Notes (Signed)
Patient c/o bladder feeling full . Bladder scan shows 610cc . Dr Eliseo Squires notifed. In and out performed with 550cc dark urine obtained. Patient does not want flexi seal removed at this time.

## 2015-11-10 ENCOUNTER — Ambulatory Visit: Payer: Medicare Other

## 2015-11-10 DIAGNOSIS — I10 Essential (primary) hypertension: Secondary | ICD-10-CM

## 2015-11-10 LAB — CBC
HEMATOCRIT: 31.7 % — AB (ref 36.0–46.0)
Hemoglobin: 10.8 g/dL — ABNORMAL LOW (ref 12.0–15.0)
MCH: 33.3 pg (ref 26.0–34.0)
MCHC: 34.1 g/dL (ref 30.0–36.0)
MCV: 97.8 fL (ref 78.0–100.0)
Platelets: 36 10*3/uL — ABNORMAL LOW (ref 150–400)
RBC: 3.24 MIL/uL — ABNORMAL LOW (ref 3.87–5.11)
RDW: 16.7 % — AB (ref 11.5–15.5)
WBC: 5.9 10*3/uL (ref 4.0–10.5)

## 2015-11-10 LAB — HEPATIC FUNCTION PANEL
ALT: 45 U/L — AB (ref 7–35)
AST: 89 U/L — AB (ref 13–35)
Alkaline Phosphatase: 145 U/L — AB (ref 25–125)
Bilirubin, Total: 3.8 mg/dL

## 2015-11-10 LAB — COMPREHENSIVE METABOLIC PANEL
ALBUMIN: 2.2 g/dL — AB (ref 3.5–5.0)
ALK PHOS: 145 U/L — AB (ref 38–126)
ALT: 45 U/L (ref 14–54)
AST: 89 U/L — AB (ref 15–41)
Anion gap: 3 — ABNORMAL LOW (ref 5–15)
BILIRUBIN TOTAL: 3.8 mg/dL — AB (ref 0.3–1.2)
BUN: 30 mg/dL — AB (ref 6–20)
CALCIUM: 8.5 mg/dL — AB (ref 8.9–10.3)
CO2: 26 mmol/L (ref 22–32)
CREATININE: 0.87 mg/dL (ref 0.44–1.00)
Chloride: 109 mmol/L (ref 101–111)
GFR calc Af Amer: >60 mL/min (ref >60)
GFR calc non Af Amer: >60 mL/min (ref >60)
GLUCOSE: 94 mg/dL (ref 65–99)
POTASSIUM: 3.4 mmol/L — AB (ref 3.5–5.1)
Sodium: 138 mmol/L (ref 135–145)
TOTAL PROTEIN: 4.9 g/dL — AB (ref 6.5–8.1)

## 2015-11-10 LAB — CBC AND DIFFERENTIAL
HCT: 32 % — AB (ref 36–46)
Hemoglobin: 10.8 g/dL — AB (ref 12.0–16.0)
PLATELETS: 36 10*3/uL — AB (ref 150–399)
WBC: 5.9 10^3/mL

## 2015-11-10 LAB — BASIC METABOLIC PANEL
BUN: 30 mg/dL — AB (ref 4–21)
Creatinine: 0.9 mg/dL (ref 0.5–1.1)
Glucose: 94 mg/dL
POTASSIUM: 3.4 mmol/L (ref 3.4–5.3)
Sodium: 138 mmol/L (ref 137–147)

## 2015-11-10 MED ORDER — SPIRONOLACTONE 25 MG PO TABS
100.0000 mg | ORAL_TABLET | Freq: Every day | ORAL | Status: DC
  Administered 2015-11-10 – 2015-11-13 (×4): 100 mg via ORAL
  Filled 2015-11-10 (×4): qty 4

## 2015-11-10 MED ORDER — DIPHENHYDRAMINE HCL 25 MG PO CAPS
25.0000 mg | ORAL_CAPSULE | Freq: Four times a day (QID) | ORAL | Status: DC | PRN
  Administered 2015-11-10 – 2015-11-12 (×2): 25 mg via ORAL
  Filled 2015-11-10 (×2): qty 1

## 2015-11-10 MED ORDER — ALUM & MAG HYDROXIDE-SIMETH 200-200-20 MG/5ML PO SUSP
30.0000 mL | ORAL | Status: DC | PRN
  Administered 2015-11-10: 30 mL via ORAL

## 2015-11-10 MED ORDER — CLINDAMYCIN HCL 300 MG PO CAPS
300.0000 mg | ORAL_CAPSULE | Freq: Three times a day (TID) | ORAL | Status: DC
  Administered 2015-11-10 – 2015-11-13 (×9): 300 mg via ORAL
  Filled 2015-11-10 (×10): qty 1

## 2015-11-10 MED ORDER — FUROSEMIDE 20 MG PO TABS
20.0000 mg | ORAL_TABLET | Freq: Every evening | ORAL | Status: DC
  Administered 2015-11-10 – 2015-11-12 (×3): 20 mg via ORAL
  Filled 2015-11-10 (×3): qty 1

## 2015-11-10 MED ORDER — FUROSEMIDE 40 MG PO TABS
40.0000 mg | ORAL_TABLET | Freq: Every day | ORAL | Status: DC
  Administered 2015-11-10 – 2015-11-12 (×3): 40 mg via ORAL
  Filled 2015-11-10 (×3): qty 1

## 2015-11-10 MED ORDER — POTASSIUM CHLORIDE CRYS ER 20 MEQ PO TBCR
20.0000 meq | EXTENDED_RELEASE_TABLET | Freq: Two times a day (BID) | ORAL | Status: DC
  Administered 2015-11-10 – 2015-11-12 (×6): 20 meq via ORAL
  Administered 2015-11-13: 40 meq via ORAL
  Filled 2015-11-10 (×6): qty 1

## 2015-11-10 NOTE — Evaluation (Signed)
Physical Therapy Evaluation Patient Details Name: [name] MRN: [id number] DOB: 26-Aug-1945 Today's Date: 11/10/2015   History of Present Illness  70 y.o. female with nonalcoholic liver cirrhosis, history of PSVT, chronic thrombocytopenia and admitted with sepsis due to R LE cellulitis  Clinical Impression  Pt admitted with above diagnosis. Pt currently with functional limitations due to the deficits listed below (see PT Problem List).  Pt will benefit from skilled PT to increase their independence and safety with mobility to allow discharge to the venue listed below.  Pt requiring increased time to perform mobility and currently requiring min assist.  Pt would benefit from ST-SNF upon d/c.     Follow Up Recommendations SNF    Equipment Recommendations  3in1 (PT)    Recommendations for Other Services       Precautions / Restrictions Precautions Precautions: Fall Restrictions Weight Bearing Restrictions: No      Mobility  Bed Mobility Overal bed mobility: Needs Assistance Bed Mobility: Supine to Sit     Supine to sit: HOB elevated;Min guard     General bed mobility comments: increased time and effort, HOB elevated and pt requires UE support to self assist (bed rail and provided a hand for pt to pull upright)  Transfers Overall transfer level: Needs assistance Equipment used: Rolling walker (2 wheeled) Transfers: Sit to/from Stand Sit to Stand: Min assist         General transfer comment: verbal cues for UE and LE positioning, increased time, slight assist to rise  Ambulation/Gait Ambulation/Gait assistance: Min assist Ambulation Distance (Feet): 30 Feet Assistive device: Rolling walker (2 wheeled) Gait Pattern/deviations: Step-to pattern Gait velocity: decr   General Gait Details: verbal cues for sequence, RW positioniong, step length, posture, increased time, recliner following for safety  Stairs            Wheelchair Mobility    Modified  Rankin (Stroke Patients Only)       Balance Overall balance assessment: History of Falls                                           Pertinent Vitals/Pain Pain Assessment: 0-10 Pain Score: 6  Pain Location: R LE Pain Descriptors / Indicators: Aching;Tightness;Tender;Sore Pain Intervention(s): Premedicated before session;Monitored during session;Limited activity within patient's tolerance;Repositioned    Home Living Family/patient expects to be discharged to:: Private residence     Type of Home: House Home Access: Stairs to enter Entrance Stairs-Rails: None Technical brewer of Steps: 2 Home Layout: One level Home Equipment: Environmental consultant - 2 wheels      Prior Function Level of Independence: Independent with assistive device(s)         Comments: pt reports not using RW however furniture walking at home, states she has had multiple falls, has been recently having BMs on her floor due to poor mobility status     Hand Dominance        Extremity/Trunk Assessment   Upper Extremity Assessment: Generalized weakness (Reports bil shoulder hx)           Lower Extremity Assessment: RLE deficits/detail;Generalized weakness RLE Deficits / Details: red, warm, edematous R lower leg       Communication   Communication: No difficulties  Cognition Arousal/Alertness: Awake/alert Behavior During Therapy: WFL for tasks assessed/performed Overall Cognitive Status: Within Functional Limits for tasks assessed  General Comments      Exercises        Assessment/Plan    PT Assessment Patient needs continued PT services  PT Diagnosis Difficulty walking;Generalized weakness   PT Problem List Decreased strength;Decreased activity tolerance;Decreased balance;Decreased knowledge of use of DME;Decreased mobility;Pain  PT Treatment Interventions Gait training;DME instruction;Therapeutic activities;Therapeutic exercise;Patient/family  education;Functional mobility training;Balance training   PT Goals (Current goals can be found in the Care Plan section) Acute Rehab PT Goals PT Goal Formulation: With patient Time For Goal Achievement: 11/17/15 Potential to Achieve Goals: Good    Frequency Min 3X/week   Barriers to discharge        Co-evaluation               End of Session Equipment Utilized During Treatment: Gait belt Activity Tolerance: Patient limited by fatigue Patient left: in chair;with call bell/phone within reach Nurse Communication: Mobility status         Time: [id number] PT Time Calculation (min) (ACUTE ONLY): 29 min   Charges:   PT Evaluation $PT Eval Moderate Complexity: 1 Procedure PT Treatments $Gait Training: 8-22 mins   PT G Codes:        Erine Phenix,KATHrine E 11/10/2015, 1:27 PM Carmelia Bake, PT, DPT 11/10/2015 Pager: [phone]

## 2015-11-10 NOTE — Progress Notes (Signed)
PROGRESS NOTE    [name]  [id number] DOB: 1945-03-24 DOA: 11/07/2015 PCP: Sheela Stack, MD   Outpatient Specialists:     Brief Narrative:    [name] is a 70 y.o. female with nonalcoholic liver cirrhosis, history of PSVT, chronic thrombocytopenia was brought to the ER after patient was found to be getting weak over the last few days. As per patient's daughter patient was doing fine 3 days ago but got weak since then. Patient also found to be lethargic. Patient denies any chest pain shortness of breath nausea vomiting or diarrhea. Has had some nonspecific abdominal discomfort around the epigastric area. Abdomen appears nontender on exam. Patient also has redness and swelling in the right lower extremity concerning for cellulitis. Patient is febrile with lactate elevated and patient looking drowsy and confused. Patient bilirubin also has increased from baseline but ammonia levels are normal. Has had a recent fall 2 weeks ago. CT head and x-ray pelvis are pending. Patient does have some pain in the right lower extremity on moving. Patient is being admitted for sepsis most likely source would be right leg cellulitis.   Assessment & Plan:   Principal Problem:   Sepsis ([place]) Active Problems:   Thrombocytopenia, unspecified (HCC)   Cirrhosis, non-alcoholic (HCC)   HTN (hypertension)   Cellulitis of right lower extremity   Sepsis ([place]) Secondary to cellulitis. Right lower extremity was red and swollen warm to touch and tender-- improved  -DVT ruled out on ultrasound Doppler. - IV vancomycin and Levaquin--- change to PO clindamycin  Fall. Patient presented with a suspected fall. Neurologically the patient does not have any focal deficit.  X-ray of pelvis and CT of the head is unremarkable. This is likely secondary to medication as well as hepatic encephalopathy. PT Eval  Suspected hepatic encephalopathy. D/c lactulose due to diarrhea -add  rifaxamim  Hypokalemia. Replace  Rhabdomyolysis. Mild elevation of CK. Unclear of the etiology. Likely from immobilization. No evidence of renal damage.  Elevated troponin. Echocardiogram does not show any evidence of acute wall motion abnormality. Ejection fraction is also normal. Next on this is likely in the setting of demand ischemia.   Cirrhosis of the liver. Nonalcoholic. Coagulopathy. Vitamin K given.   Restless leg syndrome. Resume Requip.   Poor IV access.  IR guided PICC line was inserted.   DVT prophylaxis:  SCD's  Code Status: Full Code   Family Communication: patient  Disposition Plan:  PT eval   Consultants:        Subjective: No overnight events No Sob  Objective: Vitals:   11/09/15 1458 11/09/15 2053 11/10/15 0500 11/10/15 0829  BP: (!) 145/49 (!) 123/56 (!) 152/71 (!) 156/66  Pulse: 81 76 86 84  Resp: 20 18 18 16   Temp: 98 F (36.7 C) 98 F (36.7 C) 98.2 F (36.8 C) 98.2 F (36.8 C)  TempSrc: Oral Oral Oral Oral  SpO2: 100% 96% 100% 98%  Weight:      Height:        Intake/Output Summary (Last 24 hours) at 11/10/15 1147 Last data filed at 11/10/15 0918  Gross per 24 hour  Intake              970 ml  Output              950 ml  Net               20 ml   Filed Weights   11/07/15 1949 11/08/15 0020 11/09/15  0500  Weight: 90.7 kg (200 lb) 97.1 kg (214 lb 1.1 oz) 106.8 kg (235 lb 7.2 oz)    Examination:  General exam: Appears calm and comfortable  Respiratory system: Clear to auscultation. Respiratory effort normal. Cardiovascular system: S1 & S2 heard, RRR. No JVD, murmurs, rubs, gallops or clicks. + edema Gastrointestinal system: Abdomen is nondistended, soft and nontender. No organomegaly or masses felt. Normal bowel sounds heard. Central nervous system: Alert and oriented. No focal neurological deficits. Extremities: + edema b/l-- mild redness Psychiatry: Judgement and insight appear normal. Mood & affect  appropriate. t    Data Reviewed: I have personally reviewed following labs and imaging studies  CBC:  Recent Labs Lab 11/07/15 1910 11/08/15 0215 11/09/15 0430 11/10/15 0345  WBC 7.9 7.5 7.5 5.9  NEUTROABS 7.2 6.5 5.7  --   HGB 12.8 11.3* 10.6* 10.8*  HCT 36.7 32.5* 30.9* 31.7*  MCV 97.1 98.2 97.8 97.8  PLT 34* 34* 27* 36*   Basic Metabolic Panel:  Recent Labs Lab 11/07/15 1910 11/08/15 0215 11/08/15 0316 11/08/15 1715 11/09/15 0430 11/10/15 0345  NA 139 141  --  138 138 138  K 3.2* 2.8*  --  2.8* 3.3* 3.4*  CL 104 111  --  111 111 109  CO2 25 23  --  24 24 26   GLUCOSE 91 88  --  122* 99 94  BUN 26* 28*  --  32* 33* 30*  CREATININE 1.15* 1.05*  --  0.95 0.88 0.87  CALCIUM 8.6* 7.7*  --  7.6* 8.1* 8.5*  MG  --   --  1.4* 1.9 2.0  --    GFR: Estimated Creatinine Clearance: 80.3 mL/min (by C-G formula based on SCr of 0.87 mg/dL). Liver Function Tests:  Recent Labs Lab 11/07/15 1910 11/08/15 0215 11/09/15 0430 11/10/15 0345  AST 64* 95* 104* 89*  ALT 37 40 43 45  ALKPHOS 159* 112 107 145*  BILITOT 8.2* 5.2* 4.1* 3.8*  PROT 6.3* 5.0* 4.8* 4.9*  ALBUMIN 2.9* 2.3* 2.2* 2.2*   No results for input(s): LIPASE, AMYLASE in the last 168 hours.  Recent Labs Lab 11/07/15 1940 11/08/15 0215  AMMONIA 34 53*   Coagulation Profile:  Recent Labs Lab 11/07/15 1940 11/09/15 0430  INR 2.00 2.13   Cardiac Enzymes:  Recent Labs Lab 11/08/15 0200 11/08/15 0316 11/08/15 1716 11/08/15 2316 11/09/15 0430  CKTOTAL  --  1,902*  --   --  659*  TROPONINI 0.31*  --  0.24* 0.27* 0.24*   BNP (last 3 results) No results for input(s): PROBNP in the last 8760 hours. HbA1C: No results for input(s): HGBA1C in the last 72 hours. CBG:  Recent Labs Lab 11/08/15 0541 11/08/15 1142 11/08/15 1839 11/08/15 2342 11/09/15 0643  GLUCAP 71 119* 95 106* 92   Lipid Profile: No results for input(s): CHOL, HDL, LDLCALC, TRIG, CHOLHDL, LDLDIRECT in the last 72  hours. Thyroid Function Tests:  Recent Labs  11/08/15 1730  TSH 2.353   Anemia Panel:  Recent Labs  11/08/15 1730  VITAMINB12 625  FOLATE 10.9   Urine analysis:    Component Value Date/Time   COLORURINE ORANGE (A) 11/07/2015 2202   APPEARANCEUR CLOUDY (A) 11/07/2015 2202   LABSPEC 1.028 11/07/2015 2202   PHURINE 5.0 11/07/2015 2202   GLUCOSEU NEGATIVE 11/07/2015 2202   HGBUR SMALL (A) 11/07/2015 2202   BILIRUBINUR SMALL (A) 11/07/2015 2202   KETONESUR NEGATIVE 11/07/2015 2202   PROTEINUR NEGATIVE 11/07/2015 2202   UROBILINOGEN 1.0 04/18/2012  1030   NITRITE POSITIVE (A) 11/07/2015 2202   LEUKOCYTESUR SMALL (A) 11/07/2015 2202     ) Recent Results (from the past 240 hour(s))  Culture, blood (Routine x 2)     Status: None (Preliminary result)   Collection Time: 11/07/15  7:05 PM  Result Value Ref Range Status   Specimen Description BLOOD BLOOD RIGHT ARM  Final   Special Requests NONE  Final   Culture   Final    NO GROWTH 2 DAYS Performed at [facility]    Report Status PENDING  Incomplete  Culture, blood (Routine x 2)     Status: None (Preliminary result)   Collection Time: 11/07/15  7:40 PM  Result Value Ref Range Status   Specimen Description BLOOD LEFT ARM  Final   Special Requests NONE  Final   Culture   Final    NO GROWTH 2 DAYS Performed at [facility]    Report Status PENDING  Incomplete  Urine culture     Status: None   Collection Time: 11/07/15 10:02 PM  Result Value Ref Range Status   Specimen Description URINE, CLEAN CATCH  Final   Special Requests NONE  Final   Culture NO GROWTH Performed at [facility]   Final   Report Status 11/09/2015 FINAL  Final  MRSA PCR Screening     Status: None   Collection Time: 11/08/15 12:11 AM  Result Value Ref Range Status   MRSA by PCR NEGATIVE NEGATIVE Final    Comment:        The GeneXpert MRSA Assay (FDA approved for NASAL specimens only), is one component of a comprehensive  MRSA colonization surveillance program. It is not intended to diagnose MRSA infection nor to guide or monitor treatment for MRSA infections.       Anti-infectives    Start     Dose/Rate Route Frequency Ordered Stop   11/10/15 1400  clindamycin (CLEOCIN) capsule 300 mg     300 mg Oral Every 8 hours 11/10/15 1012     11/10/15 0800  vancomycin (VANCOCIN) IVPB 1000 mg/200 mL premix  Status:  Discontinued     1,000 mg 200 mL/hr over 60 Minutes Intravenous Every 12 hours 11/09/15 2338 11/10/15 1011   11/09/15 1000  rifaximin (XIFAXAN) tablet 550 mg     550 mg Oral 2 times daily 11/09/15 0829     11/09/15 1000  fluconazole (DIFLUCAN) tablet 100 mg     100 mg Oral  Once 11/09/15 0829 11/09/15 0928   11/08/15 2200  levofloxacin (LEVAQUIN) IVPB 750 mg  Status:  Discontinued     750 mg 100 mL/hr over 90 Minutes Intravenous Every 24 hours 11/07/15 2314 11/10/15 1011   11/08/15 1000  vancomycin (VANCOCIN) IVPB 750 mg/150 ml premix  Status:  Discontinued     750 mg 150 mL/hr over 60 Minutes Intravenous Every 12 hours 11/07/15 2009 11/09/15 2338   11/08/15 0400  aztreonam (AZACTAM) 1 g in dextrose 5 % 50 mL IVPB  Status:  Discontinued     1 g 100 mL/hr over 30 Minutes Intravenous Every 8 hours 11/07/15 2010 11/08/15 1716   11/08/15 0400  metroNIDAZOLE (FLAGYL) IVPB 500 mg  Status:  Discontinued     500 mg 100 mL/hr over 60 Minutes Intravenous Every 8 hours 11/07/15 2010 11/07/15 2250   11/07/15 2300  levofloxacin (LEVAQUIN) IVPB 750 mg     750 mg 100 mL/hr over 90 Minutes Intravenous  Once 11/07/15 2250 11/08/15  [id number]   11/07/15 2300  aztreonam (AZACTAM) 2 g in dextrose 5 % 50 mL IVPB  Status:  Discontinued     2 g 100 mL/hr over 30 Minutes Intravenous  Once 11/07/15 2250 11/07/15 2252   11/07/15 2300  vancomycin (VANCOCIN) IVPB 1000 mg/200 mL premix  Status:  Discontinued     1,000 mg 200 mL/hr over 60 Minutes Intravenous  Once 11/07/15 2250 11/07/15 2252   11/07/15 2015  vancomycin  (VANCOCIN) 1,500 mg in sodium chloride 0.9 % 500 mL IVPB     1,500 mg 250 mL/hr over 120 Minutes Intravenous STAT 11/07/15 2009 11/07/15 2219   11/07/15 1945  aztreonam (AZACTAM) 2 g in dextrose 5 % 50 mL IVPB     2 g 100 mL/hr over 30 Minutes Intravenous  Once 11/07/15 1930 11/07/15 2024   11/07/15 1945  metroNIDAZOLE (FLAGYL) IVPB 500 mg     500 mg 100 mL/hr over 60 Minutes Intravenous  Once 11/07/15 1930 11/07/15 2143   11/07/15 1930  vancomycin (VANCOCIN) IVPB 1000 mg/200 mL premix  Status:  Discontinued     1,000 mg 200 mL/hr over 60 Minutes Intravenous  Once 11/07/15 1929 11/07/15 2108       Radiology Studies: Ir Fluoro Guide Cv Line Right  Result Date: 11/08/2015 CLINICAL DATA:  Sepsis, needs durable venous access. EXAM: PICC PLACEMENT WITH ULTRASOUND AND FLUOROSCOPY FLUOROSCOPY TIME:  6 seconds, 1 mGy TECHNIQUE: After written informed consent was obtained, patient was placed in the supine position on angiographic table. Patency of the right basilic vein was confirmed with ultrasound with image documentation. An appropriate skin site was determined. Skin site was marked. Region was prepped using maximum barrier technique including cap and mask, sterile gown, sterile gloves, large sterile sheet, and Chlorhexidine as cutaneous antisepsis. The region was infiltrated locally with 1% lidocaine. Under real-time ultrasound guidance, the right basilic vein was accessed with a 21 gauge micropuncture needle; the needle tip within the vein was confirmed with ultrasound image documentation. Needle exchanged over a 018 guidewire for a peel-away sheath, through which a 5-French double-lumen power injectable PICC trimmed to 38cm was advanced, positioned with its tip near the cavoatrial junction. Spot chest radiograph confirms appropriate catheter position. Catheter was flushed per protocol and secured externally. The patient tolerated procedure well. COMPLICATIONS: COMPLICATIONS none IMPRESSION: 1.  Technically successful five [place] double lumen power injectable PICC placement Electronically Signed   By: Lucrezia Europe M.D.   On: 11/08/2015 16:33   Ir US Guide Vasc Access Right  Result Date: 11/08/2015 CLINICAL DATA:  Sepsis, needs durable venous access. EXAM: PICC PLACEMENT WITH ULTRASOUND AND FLUOROSCOPY FLUOROSCOPY TIME:  6 seconds, 1 mGy TECHNIQUE: After written informed consent was obtained, patient was placed in the supine position on angiographic table. Patency of the right basilic vein was confirmed with ultrasound with image documentation. An appropriate skin site was determined. Skin site was marked. Region was prepped using maximum barrier technique including cap and mask, sterile gown, sterile gloves, large sterile sheet, and Chlorhexidine as cutaneous antisepsis. The region was infiltrated locally with 1% lidocaine. Under real-time ultrasound guidance, the right basilic vein was accessed with a 21 gauge micropuncture needle; the needle tip within the vein was confirmed with ultrasound image documentation. Needle exchanged over a 018 guidewire for a peel-away sheath, through which a 5-French double-lumen power injectable PICC trimmed to 38cm was advanced, positioned with its tip near the cavoatrial junction. Spot chest radiograph confirms appropriate catheter position. Catheter was flushed per protocol  and secured externally. The patient tolerated procedure well. COMPLICATIONS: COMPLICATIONS none IMPRESSION: 1. Technically successful five [place] double lumen power injectable PICC placement Electronically Signed   By: Lucrezia Europe M.D.   On: 11/08/2015 16:33        Scheduled Meds: . buPROPion  150 mg Oral QHS  . buPROPion  75 mg Oral Daily  . clindamycin  300 mg Oral Q8H  . furosemide  20 mg Oral QPM  . furosemide  40 mg Oral Daily  . potassium chloride SA  20 mEq Oral BID  . rifaximin  550 mg Oral BID  . rOPINIRole  2 mg Oral QHS  . sodium chloride flush  10-40 mL Intracatheter Q12H   . spironolactone  100 mg Oral Daily   Continuous Infusions:    LOS: 3 days    Time spent: 35 min    [place], DO [facility] Pager [phone]  If 7PM-7AM, please contact night-coverage [email or web address] Password [email or web address] 11/10/2015, 11:47 AM

## 2015-11-10 NOTE — Progress Notes (Signed)
Pt has refused CPAP for the night.  Pt states that she "just can't wear it".  Pt to notify RT if any assistance is required throughout her stay.  RT to monitor and assess as needed.

## 2015-11-10 NOTE — Care Management Note (Signed)
Case Management Note  Patient Details  Name: [name] MRN: [id number] Date of Birth: [birth year]  Subjective/Objective:     cellulitis               Action/Plan: home   Expected Discharge Date:   (unknown)               Expected Discharge Plan:  Home/Self Care  In-House Referral:  NA  Discharge planning Services  NA  Post Acute Care Choice:  NA Choice offered to:  NA  DME Arranged:  N/A DME Agency:  NA  HH Arranged:  NA HH Agency:     Status of Service:  In process, will continue to follow  If discussed at Long Length of Stay Meetings, dates discussed:    Additional Comments: Date:  November 10, 2015 Chart reviewed for concurrent status and case management needs. Will continue to follow the patient for status change: Discharge Planning: following for needs Expected discharge date: [id number] Velva Harman, BSN, [place], [place] Leeroy Cha, RN 11/10/2015, 11:28 AM

## 2015-11-11 DIAGNOSIS — A419 Sepsis, unspecified organism: Principal | ICD-10-CM

## 2015-11-11 DIAGNOSIS — K7460 Unspecified cirrhosis of liver: Secondary | ICD-10-CM

## 2015-11-11 DIAGNOSIS — L03115 Cellulitis of right lower limb: Secondary | ICD-10-CM

## 2015-11-11 DIAGNOSIS — D696 Thrombocytopenia, unspecified: Secondary | ICD-10-CM

## 2015-11-11 LAB — CBC AND DIFFERENTIAL: WBC: 4.5 10*3/mL

## 2015-11-11 LAB — COMPREHENSIVE METABOLIC PANEL
ALBUMIN: 2.1 g/dL — AB (ref 3.5–5.0)
ALK PHOS: 139 U/L — AB (ref 38–126)
ALT: 42 U/L (ref 14–54)
ANION GAP: 4 — AB (ref 5–15)
AST: 64 U/L — AB (ref 15–41)
BILIRUBIN TOTAL: 3.7 mg/dL — AB (ref 0.3–1.2)
BUN: 24 mg/dL — AB (ref 6–20)
CALCIUM: 8.6 mg/dL — AB (ref 8.9–10.3)
CO2: 25 mmol/L (ref 22–32)
CREATININE: 0.91 mg/dL (ref 0.44–1.00)
Chloride: 110 mmol/L (ref 101–111)
GFR calc Af Amer: >60 mL/min (ref >60)
GFR calc non Af Amer: >60 mL/min (ref >60)
GLUCOSE: 118 mg/dL — AB (ref 65–99)
Potassium: 3.5 mmol/L (ref 3.5–5.1)
SODIUM: 139 mmol/L (ref 135–145)
TOTAL PROTEIN: 4.8 g/dL — AB (ref 6.5–8.1)

## 2015-11-11 LAB — BASIC METABOLIC PANEL
BUN: 24 mg/dL — AB (ref 4–21)
CREATININE: 0.9 mg/dL (ref 0.5–1.1)
GLUCOSE: 118 mg/dL
SODIUM: 139 mmol/L (ref 137–147)

## 2015-11-11 LAB — CBC
HEMATOCRIT: 31.5 % — AB (ref 36.0–46.0)
HEMOGLOBIN: 10.8 g/dL — AB (ref 12.0–15.0)
MCH: 34.4 pg — ABNORMAL HIGH (ref 26.0–34.0)
MCHC: 34.3 g/dL (ref 30.0–36.0)
MCV: 100.3 fL — ABNORMAL HIGH (ref 78.0–100.0)
Platelets: 34 10*3/uL — ABNORMAL LOW (ref 150–400)
RBC: 3.14 MIL/uL — AB (ref 3.87–5.11)
RDW: 16.7 % — ABNORMAL HIGH (ref 11.5–15.5)
WBC: 4.5 10*3/uL (ref 4.0–10.5)

## 2015-11-11 LAB — HEPATIC FUNCTION PANEL: BILIRUBIN, TOTAL: 3.7 mg/dL

## 2015-11-11 NOTE — NC FL2 (Signed)
[place] MEDICAID FL2 LEVEL OF CARE SCREENING TOOL     IDENTIFICATION  Patient Name: [name] Birthdate: 13-May-1945 Sex: female Admission Date (Current Location): 11/07/2015  [facility] [place] and [place] Number:  Herbalist and Address:  [facility],  [place] [address] [place]      Provider Number: [id number]  Attending Physician Name and Address:  Kinnie Feil, MD  Relative Name and Phone Number:       Current Level of Care: SNF Recommended Level of Care: [place] Prior Approval Number:    Date Approved/Denied:   PASRR Number:    Discharge Plan: SNF    Current Diagnoses: Patient Active Problem List   Diagnosis Date Noted  . Sepsis ([place]) 11/07/2015  . Cellulitis of right lower extremity 11/07/2015  . Elevated LFTs   . Accelerated junctional rhythm 07/12/2015  . Encounter for loop recorder check 07/11/2015  . Syncope 03/28/2015  . Generalized weakness 07/09/2013  . Fall at home 07/09/2013  . S/P tooth extraction 07/09/2013  . Weakness 07/09/2013  . OSA (obstructive sleep apnea) 11/01/2012  . MVP (mitral valve prolapse) 11/01/2012  . PSVT (paroxysmal supraventricular tachycardia) ([place]) 11/01/2012  . Cirrhosis, non-alcoholic ([place]) [id number]  . HTN (hypertension) 11/01/2012  . Thrombocytopenia, unspecified ([place]) 12/14/2011  . Acute blood loss anemia 12/14/2011  . 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 12/14/2011    Orientation RESPIRATION BLADDER Height & Weight     Self, Place  Normal Indwelling catheter Weight: 227 lb 8.2 oz (103.2 kg) Height:  5\' 8"  (172.7 cm)  BEHAVIORAL SYMPTOMS/MOOD NEUROLOGICAL BOWEL NUTRITION STATUS      Continent Diet (Heart Healthy)  AMBULATORY STATUS COMMUNICATION OF NEEDS Skin Bruising on right lower extremity    Extensive Assist Verbally Normal                       Personal Care Assistance Level of Assistance  Bathing, Feeding, Dressing Bathing Assistance: Limited  assistance Feeding assistance: Limited assistance Dressing Assistance: Limited assistance     Functional Limitations Info  Sight, Hearing, Speech Sight Info: Adequate Hearing Info: Adequate Speech Info: Adequate    SPECIAL CARE FACTORS FREQUENCY  PT (By licensed PT)     PT Frequency: 5              Contractures Contractures Info: Not present    Additional Factors Info  Code Status, Allergies, Psychotropic Code Status Info: Full Code Allergies Info: Ondansetron Hcl, Penicillins, Aspirin, Midazolam Hcl, Niacin And Related           Current Medications (11/11/2015):  This is the current hospital active medication list Current Facility-Administered Medications  Medication Dose Route Frequency Provider Last Rate Last Dose  . acetaminophen (TYLENOL) tablet 650 mg  650 mg Oral Q6H PRN Rise Patience, MD   650 mg at 11/11/15 0858   Or  . acetaminophen (TYLENOL) suppository 650 mg  650 mg Rectal Q6H PRN Rise Patience, MD      . alum & mag hydroxide-simeth (MAALOX/MYLANTA) 200-200-20 MG/5ML suspension 30 mL  30 mL Oral PRN Geradine Girt, DO   30 mL at 11/10/15 2341  . buPROPion [facility]) tablet 150 mg  150 mg Oral QHS Lavina Hamman, MD   150 mg at 11/10/15 2115  . buPROPion [facility]) tablet 75 mg  75 mg Oral Daily Lavina Hamman, MD   75 mg at 11/11/15 1018  . clindamycin (CLEOCIN) capsule 300 mg  300 mg Oral Q8H Jessica U Vann, DO   300 mg at 11/11/15 1339  . diphenhydrAMINE (BENADRYL) capsule 25 mg  25 mg Oral Q6H PRN Gardiner Barefoot, NP   25 mg at 11/10/15 2007  . furosemide (LASIX) tablet 20 mg  20 mg Oral QPM Geradine Girt, DO   20 mg at 11/10/15 1840  . furosemide (LASIX) tablet 40 mg  40 mg Oral Daily Jessica U Vann, DO   40 mg at 11/11/15 1018  . oxyCODONE (Oxy IR/ROXICODONE) immediate release tablet 5 mg  5 mg Oral Q4H PRN Gardiner Barefoot, NP   5 mg at 11/11/15 1159  . potassium chloride SA (K-DUR,KLOR-CON) CR tablet 20 mEq  20 mEq Oral BID  Geradine Girt, DO   20 mEq at 11/11/15 1018  . rifaximin (XIFAXAN) tablet 550 mg  550 mg Oral BID Geradine Girt, DO   550 mg at 11/11/15 1018  . rOPINIRole (REQUIP) tablet 2 mg  2 mg Oral QHS Lavina Hamman, MD   2 mg at 11/10/15 2114  . sodium chloride flush (NS) 0.9 % injection 10-40 mL  10-40 mL Intracatheter Q12H Lavina Hamman, MD   10 mL at 11/10/15 2116  . sodium chloride flush (NS) 0.9 % injection 10-40 mL  10-40 mL Intracatheter PRN Lavina Hamman, MD   20 mL at 11/09/15 2236  . spironolactone (ALDACTONE) tablet 100 mg  100 mg Oral Daily Geradine Girt, DO   100 mg at 11/11/15 1017     Discharge Medications: Please see discharge summary for a list of discharge medications.  Relevant Imaging Results:  Relevant Lab Results:   Additional Information ss# [id number]  Lia Hopping, LCSW

## 2015-11-11 NOTE — Progress Notes (Signed)
[facility] PROGRESS NOTE  [name] [id number] DOB: 18-Aug-1945 DOA: 11/07/2015 PCP: Sheela Stack, MD  Principal Problem:   Sepsis ([place]) Active Problems:   Thrombocytopenia, unspecified (HCC)   Cirrhosis, non-alcoholic (HCC)   HTN (hypertension)   Cellulitis of right lower extremity  Brief summary   70 y.o.femalewith nonalcoholic liver cirrhosis, history of PSVT, chronic thrombocytopenia was brought to the ER after patient was found to be getting weak over the last few days. As per patient's daughter patient was doing fine 3 days ago but got weak since then. Patient also found to be lethargic. Patient denies any chest pain shortness of breath nausea vomiting or diarrhea. Has had some nonspecific abdominal discomfort around the epigastric area. Abdomen appears nontender on exam. Patient also has redness and swelling in the right lower extremity concerning for cellulitis. Patient is febrile with lactate elevated and patient looking drowsy and confused. Patient bilirubin also has increased from baseline but ammonia levels are normal. Has had a recent fall 2 weeks ago. CT head and x-ray pelvis are pending. Patient does have some pain in the right lower extremity on moving. Patient was admitted for sepsis most likely source would be right leg cellulitis  Assessment/Plan:  Sepsis ([place]) Secondary to cellulitis. DVT ruled out on ultrasound Doppler. -patient received IV vancomycin and Levaquin. Blood cultures:ngtd. Right leg erythema is improving.  Antibiotics changed to PO clindamycin  Fall. Patient presented with a suspected fall. Neurologically the patient does not have any focal deficit.  X-ray of pelvis and CT of the head is unremarkable. This is likely secondary to medication as well as hepatic encephalopathy. -PT Eval: needs SNF  Cirrhosis of the liver. Nonalcoholic. Coagulopathy. Vitamin K given. Abnormal LFTs, hyperbilirubinemia.  -hypervolemia, BL leg edema.  Resumed lasix, spironolactone   Suspected hepatic encephalopathy. Mental status is improving, D/c lactulose due to diarrhea. added rifaxamim  Hypokalemia. Replace  Rhabdomyolysis. Mild elevation of CK. Unclear of the etiology. Likely from immobilization. No evidence of renal damage.  Elevated troponin. Echocardiogram does not show any evidence of acute wall motion abnormality. Ejection fraction is also normal. Next on this is likely in the setting of demand ischemia.  Restless leg syndrome. Resume Requip.   Code Status: full Family Communication: d/w patient, her son.  (indicate person spoken with, relationship, and if by phone, the number) Disposition Plan: SNF soon   Consultants:  PCCM  Procedures:  none  Antibiotics: Antibiotics Given (last 72 hours)    Date/Time Action Medication Dose Rate   11/08/15 2045 Given   levofloxacin (LEVAQUIN) IVPB 750 mg 750 mg 100 mL/hr   11/08/15 2107 Given   levofloxacin (LEVAQUIN) IVPB 750 mg 750 mg 100 mL/hr   11/08/15 2108 Given   vancomycin (VANCOCIN) IVPB 750 mg/150 ml premix 750 mg 150 mL/hr   11/09/15 0929 Given   vancomycin (VANCOCIN) IVPB 750 mg/150 ml premix 750 mg 150 mL/hr   11/09/15 1016 Given   rifaximin (XIFAXAN) tablet 550 mg 550 mg    11/09/15 2105 Given   levofloxacin (LEVAQUIN) IVPB 750 mg 750 mg 100 mL/hr   11/09/15 2105 Given   rifaximin (XIFAXAN) tablet 550 mg 550 mg    11/09/15 2214 Given   vancomycin (VANCOCIN) IVPB 750 mg/150 ml premix 750 mg 150 mL/hr   11/10/15 0847 Given   vancomycin (VANCOCIN) IVPB 1000 mg/200 mL premix 1,000 mg 200 mL/hr   11/10/15 1026 Given   rifaximin (XIFAXAN) tablet 550 mg 550 mg    11/10/15 1401 Given  clindamycin (CLEOCIN) capsule 300 mg 300 mg    11/10/15 2114 Given   clindamycin (CLEOCIN) capsule 300 mg 300 mg    11/10/15 2115 Given   rifaximin (XIFAXAN) tablet 550 mg 550 mg    11/11/15 0539 Given   clindamycin (CLEOCIN) capsule 300 mg 300 mg    11/11/15 1018 Given    rifaximin (XIFAXAN) tablet 550 mg 550 mg             (indicate start date, and stop date if known)  HPI/Subjective: Alert, reports some dizziness. Had several episodes of diarrhea, related to lactulose.   Objective: Vitals:   11/11/15 0855 11/11/15 1118  BP: (!) 171/48 (!) 147/60  Pulse: 84 90  Resp:    Temp: 98.5 F (36.9 C) 98.6 F (37 C)    Intake/Output Summary (Last 24 hours) at 11/11/15 1335 Last data filed at 11/11/15 0910  Gross per 24 hour  Intake              240 ml  Output             1325 ml  Net            -1085 ml   Filed Weights   11/08/15 0020 11/09/15 0500 11/11/15 0500  Weight: 97.1 kg (214 lb 1.1 oz) 106.8 kg (235 lb 7.2 oz) 103.2 kg (227 lb 8.2 oz)    Exam:   General:  Alert, comfortable   Cardiovascular: [id number] systolic mr, BL leg edema   Respiratory: few cackles LL  Abdomen: soft, distended, nt  Musculoskeletal: leg edema    Data Reviewed: Basic Metabolic Panel:  Recent Labs Lab 11/08/15 0215 11/08/15 0316 11/08/15 1715 11/09/15 0430 11/10/15 0345 11/11/15 0416  NA 141  --  138 138 138 139  K 2.8*  --  2.8* 3.3* 3.4* 3.5  CL 111  --  111 111 109 110  CO2 23  --  24 24 26 25   GLUCOSE 88  --  122* 99 94 118*  BUN 28*  --  32* 33* 30* 24*  CREATININE 1.05*  --  0.95 0.88 0.87 0.91  CALCIUM 7.7*  --  7.6* 8.1* 8.5* 8.6*  MG  --  1.4* 1.9 2.0  --   --    Liver Function Tests:  Recent Labs Lab 11/07/15 1910 11/08/15 0215 11/09/15 0430 11/10/15 0345 11/11/15 0416  AST 64* 95* 104* 89* 64*  ALT 37 40 43 45 42  ALKPHOS 159* 112 107 145* 139*  BILITOT 8.2* 5.2* 4.1* 3.8* 3.7*  PROT 6.3* 5.0* 4.8* 4.9* 4.8*  ALBUMIN 2.9* 2.3* 2.2* 2.2* 2.1*   No results for input(s): LIPASE, AMYLASE in the last 168 hours.  Recent Labs Lab 11/07/15 1940 11/08/15 0215  AMMONIA 34 53*   CBC:  Recent Labs Lab 11/07/15 1910 11/08/15 0215 11/09/15 0430 11/10/15 0345 11/11/15 0416  WBC 7.9 7.5 7.5 5.9 4.5  NEUTROABS 7.2 6.5  5.7  --   --   HGB 12.8 11.3* 10.6* 10.8* 10.8*  HCT 36.7 32.5* 30.9* 31.7* 31.5*  MCV 97.1 98.2 97.8 97.8 100.3*  PLT 34* 34* 27* 36* 34*   Cardiac Enzymes:  Recent Labs Lab 11/08/15 0200 11/08/15 0316 11/08/15 1716 11/08/15 2316 11/09/15 0430  CKTOTAL  --  1,902*  --   --  659*  TROPONINI 0.31*  --  0.24* 0.27* 0.24*   BNP (last 3 results) No results for input(s): BNP in the last 8760 hours.  ProBNP (last 3  results) No results for input(s): PROBNP in the last 8760 hours.  CBG:  Recent Labs Lab 11/08/15 0541 11/08/15 1142 11/08/15 1839 11/08/15 2342 11/09/15 0643  GLUCAP 71 119* 95 106* 92    Recent Results (from the past 240 hour(s))  Culture, blood (Routine x 2)     Status: None (Preliminary result)   Collection Time: 11/07/15  7:05 PM  Result Value Ref Range Status   Specimen Description BLOOD BLOOD RIGHT ARM  Final   Special Requests NONE  Final   Culture   Final    NO GROWTH 4 DAYS Performed at [facility]    Report Status PENDING  Incomplete  Culture, blood (Routine x 2)     Status: None (Preliminary result)   Collection Time: 11/07/15  7:40 PM  Result Value Ref Range Status   Specimen Description BLOOD LEFT ARM  Final   Special Requests NONE  Final   Culture   Final    NO GROWTH 4 DAYS Performed at [facility]    Report Status PENDING  Incomplete  Urine culture     Status: None   Collection Time: 11/07/15 10:02 PM  Result Value Ref Range Status   Specimen Description URINE, CLEAN CATCH  Final   Special Requests NONE  Final   Culture NO GROWTH Performed at [facility]   Final   Report Status 11/09/2015 FINAL  Final  MRSA PCR Screening     Status: None   Collection Time: 11/08/15 12:11 AM  Result Value Ref Range Status   MRSA by PCR NEGATIVE NEGATIVE Final    Comment:        The GeneXpert MRSA Assay (FDA approved for NASAL specimens only), is one component of a comprehensive MRSA colonization surveillance  program. It is not intended to diagnose MRSA infection nor to guide or monitor treatment for MRSA infections.      Studies: No results found.  Scheduled Meds: . buPROPion  150 mg Oral QHS  . buPROPion  75 mg Oral Daily  . clindamycin  300 mg Oral Q8H  . furosemide  20 mg Oral QPM  . furosemide  40 mg Oral Daily  . potassium chloride SA  20 mEq Oral BID  . rifaximin  550 mg Oral BID  . rOPINIRole  2 mg Oral QHS  . sodium chloride flush  10-40 mL Intracatheter Q12H  . spironolactone  100 mg Oral Daily   Continuous Infusions:   Principal Problem:   Sepsis ([place]) Active Problems:   Thrombocytopenia, unspecified ([place])   Cirrhosis, non-alcoholic ([place])   HTN (hypertension)   Cellulitis of right lower extremity    Time spent: >35 minutes     Kinnie Feil  [facility] Pager [phone]. If 7PM-7AM, please contact night-coverage at [email or web address], password [facility] 11/11/2015, 1:35 PM  LOS: 4 days

## 2015-11-11 NOTE — Clinical Social Work Note (Signed)
Clinical Social Work Assessment  Patient Details  Name: [name] MRN: [id number] Date of Birth: 12/11/1945  Date of referral:  11/11/15               Reason for consult:  Facility Placement                Permission sought to share information with:  Facility Sport and exercise psychologist, Family Supports Permission granted to share information::  Yes, Verbal Permission Granted  Name::     SNF  Agency::     Relationship::  Son: [name] Information:  [phone]  Housing/Transportation Living arrangements for the past 2 months:  [place] of Information:  Patient Patient Interpreter Needed:  None Criminal Activity/Legal Involvement Pertinent to Current Situation/Hospitalization:  No - Comment as needed Significant Relationships:  Adult Children Lives with:  Adult Children Do you feel safe going back to the place where you live?  Yes Need for family participation in patient care:  Yes (Comment)  Care giving concerns:  Patient has been having difficulty ambulating well at home. She reports she does not feel comfortable going back to home.Patient reports she has an infection in the bloodstream and has caused bruising on her legs. She reports she has been in the feeling quite week and dizzy. She reports this is her first time going to a SNF and will appreciate assistance with the process. Patient reports her son is very involved in her care.     Social Worker assessment / plan: LCSWA met with with patient at bedside. Patient alert and oriented x2. LCSWA explained the SNF process. Patient reported she understood and appreciated LCSWA assistance. Patient reports she wants a Facility closer to home if possible, [place] is a possible choice but pt. Wants to know the price before choosing a bed.   Weekend CSW to provide SNF list to patient. Plan: Assist with disposition to SNF  Employment status:  Retired Nurse, adult PT  Recommendations:  [place] / Referral to community resources:  [place]  Patient/Family's Response to care:  Agreeable to SNF and responding well to patient care.   Patient/Family's Understanding of and Emotional Response to Diagnosis, Current Treatment, and Prognosis:  "There are a lot of things I don't understand about my health and I am learning." Patient is accepting/agreeable to SNF for more  treatment before going home.   Emotional Assessment Appearance:  Appears stated age Attitude/Demeanor/Rapport:    Affect (typically observed):  Accepting, Pleasant Orientation:  Oriented to Self, Oriented to Place Alcohol / Substance use:  Not Applicable Psych involvement (Current and /or in the community):  No (Comment)  Discharge Needs  Concerns to be addressed:  Care Coordination, Discharge Planning Concerns Readmission within the last 30 days:  No Current discharge risk:  None Barriers to Discharge:  Continued Medical Work up   [employer], [place] 11/11/2015, 5:42 PM

## 2015-11-11 NOTE — Progress Notes (Signed)
Pt refused CPAP qhs.  Education provided.  RT will continue to monitor as needed. 

## 2015-11-11 NOTE — Progress Notes (Signed)
LCSWA informed patient the weekend social worker will follow up with bed offers and assist with SNF placement.

## 2015-11-11 NOTE — Progress Notes (Signed)
Physical Therapy Treatment Patient Details Name: [name] MRN: [id number] DOB: 1945/07/04 Today's Date: 11/11/2015    History of Present Illness 70 y.o. female with nonalcoholic liver cirrhosis, history of PSVT, chronic thrombocytopenia and admitted with sepsis due to R LE cellulitis    PT Comments    Increased assistance for transfers needed today, dizziness limited ambulation to 8'. Pt mildly confused which is new per RN. She had to be oriented to call bell multiple times but is oriented to self, location, and situation.   Follow Up Recommendations  SNF     Equipment Recommendations  3in1 (PT)    Recommendations for Other Services       Precautions / Restrictions Precautions Precautions: Fall Restrictions Weight Bearing Restrictions: No    Mobility  Bed Mobility               General bed mobility comments: up on 3 in 1  Transfers Overall transfer level: Needs assistance Equipment used: Rolling walker (2 wheeled) Transfers: Sit to/from Stand Sit to Stand: Mod assist         General transfer comment: mod A to rise, VCs hand placement  Ambulation/Gait Ambulation/Gait assistance: Min guard Ambulation Distance (Feet): 8 Feet Assistive device: Rolling walker (2 wheeled) Gait Pattern/deviations: Decreased step length - right;Decreased step length - left;Shuffle Gait velocity: decr Gait velocity interpretation: Below normal speed for age/gender General Gait Details: increased time, shuffling gait, distance limited by onset of dizziness   Stairs            Wheelchair Mobility    Modified Rankin (Stroke Patients Only)       Balance Overall balance assessment: History of Falls                                  Cognition Arousal/Alertness: Awake/alert Behavior During Therapy: WFL for tasks assessed/performed Overall Cognitive Status: Within Functional Limits for tasks assessed                      Exercises General  Exercises - Lower Extremity Ankle Circles/Pumps: AROM;Both;15 reps;Seated Long Arc Quad: AROM;Both;10 reps;Seated Hip Flexion/Marching: AROM;5 reps;Seated;Both    General Comments        Pertinent Vitals/Pain Pain Score: 7  Pain Location: RLE with activity Pain Descriptors / Indicators: Tingling;Sore Pain Intervention(s): Limited activity within patient's tolerance;Monitored during session;Premedicated before session    Home Living                      Prior Function            PT Goals (current goals can now be found in the care plan section) Acute Rehab PT Goals PT Goal Formulation: With patient/family Time For Goal Achievement: 11/17/15 Potential to Achieve Goals: Good Progress towards PT goals: Not progressing toward goals - comment (dizziness )    Frequency  Min 3X/week    PT Plan Current plan remains appropriate    Co-evaluation             End of Session Equipment Utilized During Treatment: Gait belt Activity Tolerance: Patient limited by fatigue Patient left: in chair;with call bell/phone within reach;with family/visitor present     Time: [id number] PT Time Calculation (min) (ACUTE ONLY): 27 min  Charges:  $Therapeutic Exercise: 8-22 mins $Therapeutic Activity: 8-22 mins  G Codes:      [name] 11/11/2015, 1:33 PM [phone]

## 2015-11-11 NOTE — Care Management Important Message (Signed)
Important Message  Patient Details  Name: [name] MRN: [id number] Date of Birth: 06/23/1945   Medicare Important Message Given:  Yes    Camillo Flaming 11/11/2015, 11:15 AMImportant Message  Patient Details  Name: [name] MRN: [id number] Date of Birth: 1945-05-24   Medicare Important Message Given:  Yes    Camillo Flaming 11/11/2015, 11:15 AM

## 2015-11-12 LAB — CULTURE, BLOOD (ROUTINE X 2)
CULTURE: NO GROWTH
CULTURE: NO GROWTH

## 2015-11-12 MED ORDER — ACETAMINOPHEN 325 MG PO TABS: 650.0000 mg | ORAL_TABLET | Freq: Four times a day (QID) | ORAL | Status: DC | PRN

## 2015-11-12 MED ORDER — CLINDAMYCIN HCL 300 MG PO CAPS: 300.0000 mg | ORAL_CAPSULE | Freq: Three times a day (TID) | ORAL | Status: DC

## 2015-11-12 MED ORDER — OXYCODONE HCL 5 MG PO TABS: 5.0000 mg | ORAL_TABLET | ORAL | 0 refills | Status: DC | PRN

## 2015-11-12 MED ORDER — RIFAXIMIN 550 MG PO TABS: 550.0000 mg | ORAL_TABLET | Freq: Two times a day (BID) | ORAL | Status: DC

## 2015-11-12 MED ORDER — DIPHENHYDRAMINE HCL 25 MG PO CAPS: 25.0000 mg | ORAL_CAPSULE | Freq: Four times a day (QID) | ORAL | 0 refills | Status: DC | PRN

## 2015-11-12 NOTE — Clinical Social Work Placement (Signed)
   CLINICAL SOCIAL WORK PLACEMENT  NOTE 11/12/15 - BED OFFERS GIVEN AND SUBMITTED FOR PASRR.  **NO PASRR NUMBER 9/9 - MANUAL REVIEW  Date:  11/12/2015  Patient Details  Name: [name] MRN: [id number] Date of Birth: Jan 22, 1946  Clinical Social Work is seeking post-discharge placement for this patient at the [place] level of care (*CSW will initial, date and re-position this form in  chart as items are completed):  Yes   Patient/family provided with [facility] Work Department's list of facilities offering this level of care within the geographic area requested by the patient (or if unable, by the patient's family).  Yes   Patient/family informed of their freedom to choose among providers that offer the needed level of care, that participate in Medicare, Medicaid or managed care program needed by the patient, have an available bed and are willing to accept the patient.  Yes   Patient/family informed of [facility]'s ownership interest in [facility] and [facility], as well as of the fact that they are under no obligation to receive care at these facilities.  PASRR submitted to [employer] on 11/12/15 ([place] Review)     PASRR number received on       Existing PASRR number confirmed on       FL2 transmitted to all facilities in geographic area requested by pt/family on 11/11/15     FL2 transmitted to all facilities within larger geographic area on       Patient informed that his/her managed care company has contracts with or will negotiate with certain facilities, including the following:        Yes (11/12/15)   Patient/family informed of bed offers received.  Patient chooses bed at       Physician recommends and patient chooses bed at      Patient to be transferred to   on  .  Patient to be transferred to facility by       Patient family notified on   of transfer.  Name of family member notified:        PHYSICIAN        Additional Comment:    _______________________________________________ Sable Feil, LCSW 11/12/2015, 3:54 PM

## 2015-11-12 NOTE — Progress Notes (Signed)
Pt. Refused cpap. 

## 2015-11-12 NOTE — Discharge Summary (Signed)
Physician Discharge Summary  [name] [id number] DOB: Sep 07, 1945 DOA: 11/07/2015  PCP: Sheela Stack, MD  Admit date: 11/07/2015 Discharge date: 11/12/2015   Recommendations for Outpatient Follow-Up:   1. D/c with foley, will need voiding trial in 1 week or urology follow up 2. clinda to 9/11 3. Cbc, bmp 1 week   Discharge Diagnosis:   Principal Problem:   Sepsis ([place]) Active Problems:   Thrombocytopenia, unspecified (HCC)   Cirrhosis, non-alcoholic (HCC)   HTN (hypertension)   Cellulitis of right lower extremity   Discharge disposition:  SNF:  Discharge Condition: Improved.  Diet recommendation: Low sodium, heart healthy.  Wound care: elevate extremities.   History of Present Illness:    [name] is a 70 y.o. female with nonalcoholic liver cirrhosis, history of PSVT, chronic thrombocytopenia was brought to the ER after patient was found to be getting weak over the last few days. As per patient's daughter patient was doing fine 3 days ago but got weak since then. Patient also found to be lethargic. Patient denies any chest pain shortness of breath nausea vomiting or diarrhea. Has had some nonspecific abdominal discomfort around the epigastric area. Abdomen appears nontender on exam. Patient also has redness and swelling in the right lower extremity concerning for cellulitis. Patient is febrile with lactate elevated and patient looking drowsy and confused. Patient bilirubin also has increased from baseline but ammonia levels are normal. Has had a recent fall 2 weeks ago. CT head and x-ray pelvis are pending. Patient does have some pain in the right lower extremity on moving. Patient is being admitted for sepsis most likely source would be right leg cellulitis.   Hospital Course by Problem:   Sepsis ([place]) Secondary to cellulitis. DVT ruled out on ultrasound Doppler. -patient received IV vancomycin and Levaquin. Blood cultures:ngtd. Right leg erythema is  improving.  Antibiotics changed to PO clindamycin  Fall. Patient presented with a suspected fall. Neurologically the patient does not have any focal deficit.  X-ray of pelvis and CT of the head is unremarkable. This is likely secondary to medication as well as hepatic encephalopathy. -PT Eval: needs SNF  Cirrhosis of the liver. Nonalcoholic. Coagulopathy. Vitamin K given. Abnormal LFTs, hyperbilirubinemia.  -hypervolemia, BL leg edema. Resumed lasix, spironolactone-- needs further diuresis  Suspected hepatic encephalopathy. Mental status is improving, D/c lactulose due to diarrhea. added rifaxamim  Hypokalemia. Replace  Rhabdomyolysis. Mild elevation of CK. Unclear of the etiology. Likely from immobilization. No evidence of renal damage.  Elevated troponin. Echocardiogram does not show any evidence of acute wall motion abnormality. Ejection fraction is also normal. this is likely in the setting of demand ischemia.  Restless leg syndrome. Resume Requip.    Medical Consultants:    None.   Discharge Exam:   Vitals:   11/11/15 2100 11/12/15 0649  BP: 135/62 (!) 175/95  Pulse: 76 88  Resp: 18 18  Temp: 99 F (37.2 C) 98.5 F (36.9 C)   Vitals:   11/11/15 1118 11/11/15 1350 11/11/15 2100 11/12/15 0649  BP: (!) 147/60 (!) 151/66 135/62 (!) 175/95  Pulse: 90 77 76 88  Resp:  18 18 18   Temp: 98.6 F (37 C) 98.4 F (36.9 C) 99 F (37.2 C) 98.5 F (36.9 C)  TempSrc: Axillary Oral Oral Oral  SpO2:  99% 100% 100%  Weight:      Height:        Gen:  NAD    The results of significant diagnostics from this hospitalization (  including imaging, microbiology, ancillary and laboratory) are listed below for reference.     Procedures and Diagnostic Studies:   Dg Chest 2 View  Result Date: 11/07/2015 CLINICAL DATA:  70 year old female with weakness, fever, and headache EXAM: CHEST  2 VIEW COMPARISON:  Chest radiograph dated 06/14/2013 FINDINGS: Two views of the chest do  not demonstrate a focal consolidation. There is no pleural effusion or pneumothorax. Stable normal cardiac silhouette. No acute osseous pathology identified. Partially visualized left proximal humeral fixation plate and screws. Multiple surgical clips noted in the left breast area. IMPRESSION: No active cardiopulmonary disease. Electronically Signed   By: Anner Crete M.D.   On: 11/07/2015 19:30   Ct Head Wo Contrast  Result Date: 11/07/2015 CLINICAL DATA:  Acute onset of generalized weakness and fever. Altered mental status. Initial encounter. EXAM: CT HEAD WITHOUT CONTRAST TECHNIQUE: Contiguous axial images were obtained from the base of the skull through the vertex without intravenous contrast. COMPARISON:  None. FINDINGS: Brain: No evidence of acute infarction, hemorrhage, hydrocephalus, extra-axial collection or mass lesion/mass effect. The posterior fossa, including the cerebellum, brainstem and fourth ventricle, is within normal limits. The third and lateral ventricles, and basal ganglia are unremarkable in appearance. The cerebral hemispheres are symmetric in appearance, with normal gray-white differentiation. No mass effect or midline shift is seen. Vascular: No hyperdense vessel or unexpected calcification. Skull: There is no evidence of fracture; visualized osseous structures are unremarkable in appearance. Sinuses/Orbits: The visualized portions of the orbits are within normal limits. The paranasal sinuses and mastoid air cells are well-aerated. Other: No significant soft tissue abnormalities are seen. IMPRESSION: Unremarkable noncontrast CT of the head. Electronically Signed   By: Garald Balding M.D.   On: 11/07/2015 23:29   US Abdomen Complete  Result Date: 11/08/2015 CLINICAL DATA:  Evaluate for portal vein thrombosis. Elevated LFTs. Initial encounter. EXAM: EXAM ULTRASOUND ABDOMEN COMPLETE DUPLEX ULTRASOUND OF LIVER TECHNIQUE: Complete abdominal ultrasound examination was performed. Color  and duplex Doppler ultrasound was also performed to evaluate the hepatic in-flow and out-flow vessels. COMPARISON:  CT of the abdomen and pelvis performed 10/01/2013, and abdominal ultrasound performed 04/09/2014 FINDINGS: Portal Vein Velocities Main: 31.2 cm/sec Right: 15.7  cm/sec Left: 13.3 cm/sec Hepatic Vein Velocities Right: 25.5 cm/sec Middle: 30.7 cm/sec Left: 22.6 cm/sec Hepatic artery velocity: 75.2 cm/sec Splenic vein velocity: 29.0 cm/sec Varices:  Known mild varices are difficult to fully characterize. Ascites: None seen. Vascular structures: There appears to be a left-sided recanalized umbilical vein. Normal hepatopetal flow is seen within the portal venous system, while normal triphasic antegrade flow is noted within the hepatic veins. There is no evidence of thrombosis. Gallbladder: Status post cholecystectomy.  No retained stones seen. Common bile duct: Diameter: 1.3 cm, likely within normal limits status post cholecystectomy. Liver: No focal lesion identified. Coarsened echotexture likely reflects fatty infiltration. The nodular contour raises concern for mild hepatic cirrhosis. IVC: No abnormality visualized. Pancreas: Not visualized due to overlying bowel gas. Spleen: Enlarged, measuring 18.4 cm in length. Right Kidney: Length: 11.2 cm. Echogenicity within normal limits. No mass or hydronephrosis visualized. Left Kidney: Length: 11.6 cm. Echogenicity within normal limits. No mass or hydronephrosis visualized. Abdominal aorta: No aneurysm visualized. Not characterized distally due to overlying bowel gas. Other Findings: None. IMPRESSION: 1. No evidence of portal vein thrombosis. 2. Changes of mild hepatic cirrhosis, with recanalized left-sided umbilical vein. 3. Normal hepatopetal flow within the portal venous system, and normal triphasic vascular flow within the hepatic veins. 4. Splenomegaly noted. 5. Known  mild varices are difficult to fully characterize. Electronically Signed   By: Garald Balding M.D.   On: 11/08/2015 02:27   Dg Pelvis Portable  Result Date: 11/07/2015 CLINICAL DATA:  Status post fall, with concern for pelvic injury. Initial encounter. EXAM: PORTABLE PELVIS 1-2 VIEWS COMPARISON:  CT of the abdomen and pelvis performed 06/03/2013 FINDINGS: There is no evidence of fracture or dislocation. Both femoral heads are seated normally within their respective acetabula. No significant degenerative change is appreciated. The sacroiliac joints are unremarkable in appearance. The visualized bowel gas pattern is grossly unremarkable in appearance. Clips are seen overlying the left inguinal region. IMPRESSION: No evidence of fracture or dislocation. Electronically Signed   By: Garald Balding M.D.   On: 11/07/2015 23:21   [place] Art/ven Flow Abd Pelv Doppler  Result Date: 11/08/2015 CLINICAL DATA:  Evaluate for portal vein thrombosis. Elevated LFTs. Initial encounter. EXAM: EXAM ULTRASOUND ABDOMEN COMPLETE DUPLEX ULTRASOUND OF LIVER TECHNIQUE: Complete abdominal ultrasound examination was performed. Color and duplex Doppler ultrasound was also performed to evaluate the hepatic in-flow and out-flow vessels. COMPARISON:  CT of the abdomen and pelvis performed 10/01/2013, and abdominal ultrasound performed 04/09/2014 FINDINGS: Portal Vein Velocities Main: 31.2 cm/sec Right: 15.7  cm/sec Left: 13.3 cm/sec Hepatic Vein Velocities Right: 25.5 cm/sec Middle: 30.7 cm/sec Left: 22.6 cm/sec Hepatic artery velocity: 75.2 cm/sec Splenic vein velocity: 29.0 cm/sec Varices:  Known mild varices are difficult to fully characterize. Ascites: None seen. Vascular structures: There appears to be a left-sided recanalized umbilical vein. Normal hepatopetal flow is seen within the portal venous system, while normal triphasic antegrade flow is noted within the hepatic veins. There is no evidence of thrombosis. Gallbladder: Status post cholecystectomy.  No retained stones seen. Common bile duct: Diameter: 1.3 cm, likely  within normal limits status post cholecystectomy. Liver: No focal lesion identified. Coarsened echotexture likely reflects fatty infiltration. The nodular contour raises concern for mild hepatic cirrhosis. IVC: No abnormality visualized. Pancreas: Not visualized due to overlying bowel gas. Spleen: Enlarged, measuring 18.4 cm in length. Right Kidney: Length: 11.2 cm. Echogenicity within normal limits. No mass or hydronephrosis visualized. Left Kidney: Length: 11.6 cm. Echogenicity within normal limits. No mass or hydronephrosis visualized. Abdominal aorta: No aneurysm visualized. Not characterized distally due to overlying bowel gas. Other Findings: None. IMPRESSION: 1. No evidence of portal vein thrombosis. 2. Changes of mild hepatic cirrhosis, with recanalized left-sided umbilical vein. 3. Normal hepatopetal flow within the portal venous system, and normal triphasic vascular flow within the hepatic veins. 4. Splenomegaly noted. 5. Known mild varices are difficult to fully characterize. Electronically Signed   By: Garald Balding M.D.   On: 11/08/2015 02:27   Ir Fluoro Guide Cv Line Right  Result Date: 11/08/2015 CLINICAL DATA:  Sepsis, needs durable venous access. EXAM: PICC PLACEMENT WITH ULTRASOUND AND FLUOROSCOPY FLUOROSCOPY TIME:  6 seconds, 1 mGy TECHNIQUE: After written informed consent was obtained, patient was placed in the supine position on angiographic table. Patency of the right basilic vein was confirmed with ultrasound with image documentation. An appropriate skin site was determined. Skin site was marked. Region was prepped using maximum barrier technique including cap and mask, sterile gown, sterile gloves, large sterile sheet, and Chlorhexidine as cutaneous antisepsis. The region was infiltrated locally with 1% lidocaine. Under real-time ultrasound guidance, the right basilic vein was accessed with a 21 gauge micropuncture needle; the needle tip within the vein was confirmed with ultrasound image  documentation. Needle exchanged over a 018 guidewire for a peel-away  sheath, through which a 5-French double-lumen power injectable PICC trimmed to 38cm was advanced, positioned with its tip near the cavoatrial junction. Spot chest radiograph confirms appropriate catheter position. Catheter was flushed per protocol and secured externally. The patient tolerated procedure well. COMPLICATIONS: COMPLICATIONS none IMPRESSION: 1. Technically successful five [place] double lumen power injectable PICC placement Electronically Signed   By: Lucrezia Europe M.D.   On: 11/08/2015 16:33   Ir US Guide Vasc Access Right  Result Date: 11/08/2015 CLINICAL DATA:  Sepsis, needs durable venous access. EXAM: PICC PLACEMENT WITH ULTRASOUND AND FLUOROSCOPY FLUOROSCOPY TIME:  6 seconds, 1 mGy TECHNIQUE: After written informed consent was obtained, patient was placed in the supine position on angiographic table. Patency of the right basilic vein was confirmed with ultrasound with image documentation. An appropriate skin site was determined. Skin site was marked. Region was prepped using maximum barrier technique including cap and mask, sterile gown, sterile gloves, large sterile sheet, and Chlorhexidine as cutaneous antisepsis. The region was infiltrated locally with 1% lidocaine. Under real-time ultrasound guidance, the right basilic vein was accessed with a 21 gauge micropuncture needle; the needle tip within the vein was confirmed with ultrasound image documentation. Needle exchanged over a 018 guidewire for a peel-away sheath, through which a 5-French double-lumen power injectable PICC trimmed to 38cm was advanced, positioned with its tip near the cavoatrial junction. Spot chest radiograph confirms appropriate catheter position. Catheter was flushed per protocol and secured externally. The patient tolerated procedure well. COMPLICATIONS: COMPLICATIONS none IMPRESSION: 1. Technically successful five [place] double lumen power injectable PICC  placement Electronically Signed   By: Lucrezia Europe M.D.   On: 11/08/2015 16:33     Labs:   Basic Metabolic Panel:  Recent Labs Lab 11/08/15 0215 11/08/15 0316 11/08/15 1715 11/09/15 0430 11/10/15 0345 11/11/15 0416  NA 141  --  138 138 138 139  K 2.8*  --  2.8* 3.3* 3.4* 3.5  CL 111  --  111 111 109 110  CO2 23  --  24 24 26 25   GLUCOSE 88  --  122* 99 94 118*  BUN 28*  --  32* 33* 30* 24*  CREATININE 1.05*  --  0.95 0.88 0.87 0.91  CALCIUM 7.7*  --  7.6* 8.1* 8.5* 8.6*  MG  --  1.4* 1.9 2.0  --   --    GFR Estimated Creatinine Clearance: 75.4 mL/min (by C-G formula based on SCr of 0.91 mg/dL). Liver Function Tests:  Recent Labs Lab 11/07/15 1910 11/08/15 0215 11/09/15 0430 11/10/15 0345 11/11/15 0416  AST 64* 95* 104* 89* 64*  ALT 37 40 43 45 42  ALKPHOS 159* 112 107 145* 139*  BILITOT 8.2* 5.2* 4.1* 3.8* 3.7*  PROT 6.3* 5.0* 4.8* 4.9* 4.8*  ALBUMIN 2.9* 2.3* 2.2* 2.2* 2.1*   No results for input(s): LIPASE, AMYLASE in the last 168 hours.  Recent Labs Lab 11/07/15 1940 11/08/15 0215  AMMONIA 34 53*   Coagulation profile  Recent Labs Lab 11/07/15 1940 11/09/15 0430  INR 2.00 2.13    CBC:  Recent Labs Lab 11/07/15 1910 11/08/15 0215 11/09/15 0430 11/10/15 0345 11/11/15 0416  WBC 7.9 7.5 7.5 5.9 4.5  NEUTROABS 7.2 6.5 5.7  --   --   HGB 12.8 11.3* 10.6* 10.8* 10.8*  HCT 36.7 32.5* 30.9* 31.7* 31.5*  MCV 97.1 98.2 97.8 97.8 100.3*  PLT 34* 34* 27* 36* 34*   Cardiac Enzymes:  Recent Labs Lab 11/08/15 0200 11/08/15 0316 11/08/15 1716  11/08/15 2316 11/09/15 0430  CKTOTAL  --  1,902*  --   --  659*  TROPONINI 0.31*  --  0.24* 0.27* 0.24*   BNP: Invalid input(s): POCBNP CBG:  Recent Labs Lab 11/08/15 0541 11/08/15 1142 11/08/15 1839 11/08/15 2342 11/09/15 0643  GLUCAP 71 119* 95 106* 92   D-Dimer No results for input(s): DDIMER in the last 72 hours. Hgb A1c No results for input(s): HGBA1C in the last 72 hours. Lipid  Profile No results for input(s): CHOL, HDL, LDLCALC, TRIG, CHOLHDL, LDLDIRECT in the last 72 hours. Thyroid function studies No results for input(s): TSH, T4TOTAL, T3FREE, THYROIDAB in the last 72 hours.  Invalid input(s): FREET3 Anemia work up No results for input(s): VITAMINB12, FOLATE, FERRITIN, TIBC, IRON, RETICCTPCT in the last 72 hours. Microbiology Recent Results (from the past 240 hour(s))  Culture, blood (Routine x 2)     Status: None (Preliminary result)   Collection Time: 11/07/15  7:05 PM  Result Value Ref Range Status   Specimen Description BLOOD BLOOD RIGHT ARM  Final   Special Requests NONE  Final   Culture   Final    NO GROWTH 4 DAYS Performed at [facility]    Report Status PENDING  Incomplete  Culture, blood (Routine x 2)     Status: None (Preliminary result)   Collection Time: 11/07/15  7:40 PM  Result Value Ref Range Status   Specimen Description BLOOD LEFT ARM  Final   Special Requests NONE  Final   Culture   Final    NO GROWTH 4 DAYS Performed at [facility]    Report Status PENDING  Incomplete  Urine culture     Status: None   Collection Time: 11/07/15 10:02 PM  Result Value Ref Range Status   Specimen Description URINE, CLEAN CATCH  Final   Special Requests NONE  Final   Culture NO GROWTH Performed at [facility]   Final   Report Status 11/09/2015 FINAL  Final  MRSA PCR Screening     Status: None   Collection Time: 11/08/15 12:11 AM  Result Value Ref Range Status   MRSA by PCR NEGATIVE NEGATIVE Final    Comment:        The GeneXpert MRSA Assay (FDA approved for NASAL specimens only), is one component of a comprehensive MRSA colonization surveillance program. It is not intended to diagnose MRSA infection nor to guide or monitor treatment for MRSA infections.      Discharge Instructions:   Discharge Instructions    Diet - low sodium heart healthy    Complete by:  As directed   Discharge instructions    Complete  by:  As directed   Fluid restrict to 1.5L/day Elevate extremities Cbc, BMP 1 week Clindamycin until 9/11   Increase activity slowly    Complete by:  As directed       Medication List    STOP taking these medications   ALPRAZolam 0.25 MG tablet Commonly known as:  XANAX   diazepam 5 MG tablet Commonly known as:  VALIUM   HYDROcodone-acetaminophen 10-325 MG tablet Commonly known as:  NORCO   promethazine 12.5 MG tablet Commonly known as:  PHENERGAN   zolpidem 5 MG tablet Commonly known as:  AMBIEN     TAKE these medications   acetaminophen 325 MG tablet Commonly known as:  TYLENOL Take 2 tablets (650 mg total) by mouth every 6 (six) hours as needed for mild pain (or Fever >/= 101).  buPROPion 75 MG tablet Commonly known as:  WELLBUTRIN Take 75-150 mg by mouth 2 (two) times daily. 75mg  in the morning, 150mg  in the evening   clindamycin 300 MG capsule Commonly known as:  CLEOCIN Take 1 capsule (300 mg total) by mouth every 8 (eight) hours.   diphenhydrAMINE 25 mg capsule Commonly known as:  BENADRYL Take 1 capsule (25 mg total) by mouth every 6 (six) hours as needed for itching, allergies or sleep.   ergocalciferol 50000 units capsule Commonly known as:  VITAMIN D2 Take 1 capsule by mouth 2 (two) times a week.   furosemide 40 MG tablet Commonly known as:  LASIX Take by mouth daily. Take 1 (40mg ) tab in the morning and half tab (20mg ) in the evening   lactulose 10 GM/15ML solution Commonly known as:  CHRONULAC Take 45 g by mouth daily as needed for mild constipation.   oxyCODONE 5 MG immediate release tablet Commonly known as:  Oxy IR/ROXICODONE Take 1 tablet (5 mg total) by mouth every 4 (four) hours as needed for severe pain.   potassium chloride SA 20 MEQ tablet Commonly known as:  K-DUR,KLOR-CON Take 1 tablet (20 mEq total) by mouth 2 (two) times daily.   rifaximin 550 MG Tabs tablet Commonly known as:  XIFAXAN Take 1 tablet (550 mg total) by mouth 2  (two) times daily.   rOPINIRole 1 MG tablet Commonly known as:  REQUIP Take 2 mg by mouth at bedtime.   spironolactone 100 MG tablet Commonly known as:  ALDACTONE Take 100 mg by mouth daily.   traZODone 50 MG tablet Commonly known as:  DESYREL Take 50 mg by mouth at bedtime.         Time coordinating discharge: 35 min  Signed:  Layni Kreamer U Anatalia Kronk   [facility] 11/12/2015, 8:43 AM

## 2015-11-12 NOTE — Progress Notes (Signed)
Awaiting PASRR number for pt to be discharged to SNF.   Foley to stay in at discharge.  Will need to call IV team to take out PICC.

## 2015-11-13 MED ORDER — GABAPENTIN 300 MG PO CAPS: 300.0000 mg | ORAL_CAPSULE | Freq: Every day | ORAL | Status: DC

## 2015-11-13 MED ORDER — FUROSEMIDE 10 MG/ML IJ SOLN
40.0000 mg | Freq: Once | INTRAMUSCULAR | Status: AC
  Administered 2015-11-13: 40 mg via INTRAVENOUS
  Filled 2015-11-13: qty 4

## 2015-11-13 MED ORDER — POTASSIUM CHLORIDE CRYS ER 20 MEQ PO TBCR
40.0000 meq | EXTENDED_RELEASE_TABLET | Freq: Once | ORAL | Status: DC
  Filled 2015-11-13: qty 2

## 2015-11-13 MED ORDER — GABAPENTIN 600 MG PO TABS: 300.0000 mg | ORAL_TABLET | Freq: Every day | ORAL | Status: DC

## 2015-11-13 NOTE — Progress Notes (Signed)
PICC line removed IV team RN. Pt stable for d/c. Complaining of some pain in RLE but managed w/ PRN medications. Pt transferring to SNF. Report called to receiving RN. [facility] to transport. Kizzie Ide, RN

## 2015-11-13 NOTE — Discharge Summary (Signed)
Physician Discharge Summary  [name] [id number] DOB: 02-02-1946 DOA: 11/07/2015  PCP: Sheela Stack, MD  Admit date: 11/07/2015 Discharge date: 11/13/2015   Recommendations for Outpatient Follow-Up:   1. D/c with foley, will need voiding trial in 1 week or urology follow up 2. clinda to 9/11 3. Cbc, bmp 1 week 4. Elevate legs when not up walking   Discharge Diagnosis:   Principal Problem:   Sepsis ([place]) Active Problems:   Thrombocytopenia, unspecified (HCC)   Cirrhosis, non-alcoholic (HCC)   HTN (hypertension)   Cellulitis of right lower extremity   Discharge disposition:  SNF:  Discharge Condition: Improved.  Diet recommendation: Low sodium, heart healthy.  Wound care: elevate extremities.   History of Present Illness:    [name] is a 70 y.o. female with nonalcoholic liver cirrhosis, history of PSVT, chronic thrombocytopenia was brought to the ER after patient was found to be getting weak over the last few days. As per patient's daughter patient was doing fine 3 days ago but got weak since then. Patient also found to be lethargic. Patient denies any chest pain shortness of breath nausea vomiting or diarrhea. Has had some nonspecific abdominal discomfort around the epigastric area. Abdomen appears nontender on exam. Patient also has redness and swelling in the right lower extremity concerning for cellulitis. Patient is febrile with lactate elevated and patient looking drowsy and confused. Patient bilirubin also has increased from baseline but ammonia levels are normal. Has had a recent fall 2 weeks ago. CT head and x-ray pelvis are pending. Patient does have some pain in the right lower extremity on moving. Patient is being admitted for sepsis most likely source would be right leg cellulitis.   Hospital Course by Problem:   Sepsis ([place]) Secondary to cellulitis. DVT ruled out on ultrasound Doppler. -patient received IV vancomycin and Levaquin. Blood  cultures:ngtd. Right leg erythema is improving.  Antibiotics changed to PO clindamycin  Fall. Patient presented with a suspected fall. Neurologically the patient does not have any focal deficit.  X-ray of pelvis and CT of the head is unremarkable. This is likely secondary to medication as well as hepatic encephalopathy. -PT Eval: needs SNF  Cirrhosis of the liver. Nonalcoholic. Coagulopathy. Vitamin K given. Abnormal LFTs, hyperbilirubinemia.  -hypervolemia, BL leg edema. Resumed lasix, spironolactone-- needs further diuresis  Suspected hepatic encephalopathy. Mental status is improving, D/c lactulose due to diarrhea. added rifaxamim  Hypokalemia. Replace  Rhabdomyolysis. Mild elevation of CK. Unclear of the etiology. Likely from immobilization. No evidence of renal damage.  Elevated troponin. Echocardiogram does not show any evidence of acute wall motion abnormality. Ejection fraction is also normal. this is likely in the setting of demand ischemia.  Restless leg syndrome. Resume Requip.    Medical Consultants:    None.   Discharge Exam:   Vitals:   11/13/15 0940 11/13/15 1100  BP:  120/60  Pulse:  76  Resp: 20 20  Temp:  97.9 F (36.6 C)   Vitals:   11/12/15 2111 11/13/15 0734 11/13/15 0940 11/13/15 1100  BP: (!) 132/96 (!) 142/74  120/60  Pulse: 87 82  76  Resp: 18 18 20 20   Temp: 98.9 F (37.2 C) 98.5 F (36.9 C)  97.9 F (36.6 C)  TempSrc: Axillary Oral  Oral  SpO2: 100% 100% 98% 98%  Weight:      Height:        Gen:  NAD    The results of significant diagnostics from this hospitalization (including  imaging, microbiology, ancillary and laboratory) are listed below for reference.     Procedures and Diagnostic Studies:   Dg Chest 2 View  Result Date: 11/07/2015 CLINICAL DATA:  70 year old female with weakness, fever, and headache EXAM: CHEST  2 VIEW COMPARISON:  Chest radiograph dated 06/14/2013 FINDINGS: Two views of the chest do not demonstrate  a focal consolidation. There is no pleural effusion or pneumothorax. Stable normal cardiac silhouette. No acute osseous pathology identified. Partially visualized left proximal humeral fixation plate and screws. Multiple surgical clips noted in the left breast area. IMPRESSION: No active cardiopulmonary disease. Electronically Signed   By: Anner Crete M.D.   On: 11/07/2015 19:30   Ct Head Wo Contrast  Result Date: 11/07/2015 CLINICAL DATA:  Acute onset of generalized weakness and fever. Altered mental status. Initial encounter. EXAM: CT HEAD WITHOUT CONTRAST TECHNIQUE: Contiguous axial images were obtained from the base of the skull through the vertex without intravenous contrast. COMPARISON:  None. FINDINGS: Brain: No evidence of acute infarction, hemorrhage, hydrocephalus, extra-axial collection or mass lesion/mass effect. The posterior fossa, including the cerebellum, brainstem and fourth ventricle, is within normal limits. The third and lateral ventricles, and basal ganglia are unremarkable in appearance. The cerebral hemispheres are symmetric in appearance, with normal gray-white differentiation. No mass effect or midline shift is seen. Vascular: No hyperdense vessel or unexpected calcification. Skull: There is no evidence of fracture; visualized osseous structures are unremarkable in appearance. Sinuses/Orbits: The visualized portions of the orbits are within normal limits. The paranasal sinuses and mastoid air cells are well-aerated. Other: No significant soft tissue abnormalities are seen. IMPRESSION: Unremarkable noncontrast CT of the head. Electronically Signed   By: Garald Balding M.D.   On: 11/07/2015 23:29   US Abdomen Complete  Result Date: 11/08/2015 CLINICAL DATA:  Evaluate for portal vein thrombosis. Elevated LFTs. Initial encounter. EXAM: EXAM ULTRASOUND ABDOMEN COMPLETE DUPLEX ULTRASOUND OF LIVER TECHNIQUE: Complete abdominal ultrasound examination was performed. Color and duplex  Doppler ultrasound was also performed to evaluate the hepatic in-flow and out-flow vessels. COMPARISON:  CT of the abdomen and pelvis performed 10/01/2013, and abdominal ultrasound performed 04/09/2014 FINDINGS: Portal Vein Velocities Main: 31.2 cm/sec Right: 15.7  cm/sec Left: 13.3 cm/sec Hepatic Vein Velocities Right: 25.5 cm/sec Middle: 30.7 cm/sec Left: 22.6 cm/sec Hepatic artery velocity: 75.2 cm/sec Splenic vein velocity: 29.0 cm/sec Varices:  Known mild varices are difficult to fully characterize. Ascites: None seen. Vascular structures: There appears to be a left-sided recanalized umbilical vein. Normal hepatopetal flow is seen within the portal venous system, while normal triphasic antegrade flow is noted within the hepatic veins. There is no evidence of thrombosis. Gallbladder: Status post cholecystectomy.  No retained stones seen. Common bile duct: Diameter: 1.3 cm, likely within normal limits status post cholecystectomy. Liver: No focal lesion identified. Coarsened echotexture likely reflects fatty infiltration. The nodular contour raises concern for mild hepatic cirrhosis. IVC: No abnormality visualized. Pancreas: Not visualized due to overlying bowel gas. Spleen: Enlarged, measuring 18.4 cm in length. Right Kidney: Length: 11.2 cm. Echogenicity within normal limits. No mass or hydronephrosis visualized. Left Kidney: Length: 11.6 cm. Echogenicity within normal limits. No mass or hydronephrosis visualized. Abdominal aorta: No aneurysm visualized. Not characterized distally due to overlying bowel gas. Other Findings: None. IMPRESSION: 1. No evidence of portal vein thrombosis. 2. Changes of mild hepatic cirrhosis, with recanalized left-sided umbilical vein. 3. Normal hepatopetal flow within the portal venous system, and normal triphasic vascular flow within the hepatic veins. 4. Splenomegaly noted. 5. Known mild  varices are difficult to fully characterize. Electronically Signed   By: Garald Balding M.D.    On: 11/08/2015 02:27   Dg Pelvis Portable  Result Date: 11/07/2015 CLINICAL DATA:  Status post fall, with concern for pelvic injury. Initial encounter. EXAM: PORTABLE PELVIS 1-2 VIEWS COMPARISON:  CT of the abdomen and pelvis performed 06/03/2013 FINDINGS: There is no evidence of fracture or dislocation. Both femoral heads are seated normally within their respective acetabula. No significant degenerative change is appreciated. The sacroiliac joints are unremarkable in appearance. The visualized bowel gas pattern is grossly unremarkable in appearance. Clips are seen overlying the left inguinal region. IMPRESSION: No evidence of fracture or dislocation. Electronically Signed   By: Garald Balding M.D.   On: 11/07/2015 23:21   [place] Art/ven Flow Abd Pelv Doppler  Result Date: 11/08/2015 CLINICAL DATA:  Evaluate for portal vein thrombosis. Elevated LFTs. Initial encounter. EXAM: EXAM ULTRASOUND ABDOMEN COMPLETE DUPLEX ULTRASOUND OF LIVER TECHNIQUE: Complete abdominal ultrasound examination was performed. Color and duplex Doppler ultrasound was also performed to evaluate the hepatic in-flow and out-flow vessels. COMPARISON:  CT of the abdomen and pelvis performed 10/01/2013, and abdominal ultrasound performed 04/09/2014 FINDINGS: Portal Vein Velocities Main: 31.2 cm/sec Right: 15.7  cm/sec Left: 13.3 cm/sec Hepatic Vein Velocities Right: 25.5 cm/sec Middle: 30.7 cm/sec Left: 22.6 cm/sec Hepatic artery velocity: 75.2 cm/sec Splenic vein velocity: 29.0 cm/sec Varices:  Known mild varices are difficult to fully characterize. Ascites: None seen. Vascular structures: There appears to be a left-sided recanalized umbilical vein. Normal hepatopetal flow is seen within the portal venous system, while normal triphasic antegrade flow is noted within the hepatic veins. There is no evidence of thrombosis. Gallbladder: Status post cholecystectomy.  No retained stones seen. Common bile duct: Diameter: 1.3 cm, likely within normal  limits status post cholecystectomy. Liver: No focal lesion identified. Coarsened echotexture likely reflects fatty infiltration. The nodular contour raises concern for mild hepatic cirrhosis. IVC: No abnormality visualized. Pancreas: Not visualized due to overlying bowel gas. Spleen: Enlarged, measuring 18.4 cm in length. Right Kidney: Length: 11.2 cm. Echogenicity within normal limits. No mass or hydronephrosis visualized. Left Kidney: Length: 11.6 cm. Echogenicity within normal limits. No mass or hydronephrosis visualized. Abdominal aorta: No aneurysm visualized. Not characterized distally due to overlying bowel gas. Other Findings: None. IMPRESSION: 1. No evidence of portal vein thrombosis. 2. Changes of mild hepatic cirrhosis, with recanalized left-sided umbilical vein. 3. Normal hepatopetal flow within the portal venous system, and normal triphasic vascular flow within the hepatic veins. 4. Splenomegaly noted. 5. Known mild varices are difficult to fully characterize. Electronically Signed   By: Garald Balding M.D.   On: 11/08/2015 02:27   Ir Fluoro Guide Cv Line Right  Result Date: 11/08/2015 CLINICAL DATA:  Sepsis, needs durable venous access. EXAM: PICC PLACEMENT WITH ULTRASOUND AND FLUOROSCOPY FLUOROSCOPY TIME:  6 seconds, 1 mGy TECHNIQUE: After written informed consent was obtained, patient was placed in the supine position on angiographic table. Patency of the right basilic vein was confirmed with ultrasound with image documentation. An appropriate skin site was determined. Skin site was marked. Region was prepped using maximum barrier technique including cap and mask, sterile gown, sterile gloves, large sterile sheet, and Chlorhexidine as cutaneous antisepsis. The region was infiltrated locally with 1% lidocaine. Under real-time ultrasound guidance, the right basilic vein was accessed with a 21 gauge micropuncture needle; the needle tip within the vein was confirmed with ultrasound image  documentation. Needle exchanged over a 018 guidewire for a peel-away sheath,  through which a 5-French double-lumen power injectable PICC trimmed to 38cm was advanced, positioned with its tip near the cavoatrial junction. Spot chest radiograph confirms appropriate catheter position. Catheter was flushed per protocol and secured externally. The patient tolerated procedure well. COMPLICATIONS: COMPLICATIONS none IMPRESSION: 1. Technically successful five [place] double lumen power injectable PICC placement Electronically Signed   By: Lucrezia Europe M.D.   On: 11/08/2015 16:33   Ir US Guide Vasc Access Right  Result Date: 11/08/2015 CLINICAL DATA:  Sepsis, needs durable venous access. EXAM: PICC PLACEMENT WITH ULTRASOUND AND FLUOROSCOPY FLUOROSCOPY TIME:  6 seconds, 1 mGy TECHNIQUE: After written informed consent was obtained, patient was placed in the supine position on angiographic table. Patency of the right basilic vein was confirmed with ultrasound with image documentation. An appropriate skin site was determined. Skin site was marked. Region was prepped using maximum barrier technique including cap and mask, sterile gown, sterile gloves, large sterile sheet, and Chlorhexidine as cutaneous antisepsis. The region was infiltrated locally with 1% lidocaine. Under real-time ultrasound guidance, the right basilic vein was accessed with a 21 gauge micropuncture needle; the needle tip within the vein was confirmed with ultrasound image documentation. Needle exchanged over a 018 guidewire for a peel-away sheath, through which a 5-French double-lumen power injectable PICC trimmed to 38cm was advanced, positioned with its tip near the cavoatrial junction. Spot chest radiograph confirms appropriate catheter position. Catheter was flushed per protocol and secured externally. The patient tolerated procedure well. COMPLICATIONS: COMPLICATIONS none IMPRESSION: 1. Technically successful five [place] double lumen power injectable PICC  placement Electronically Signed   By: Lucrezia Europe M.D.   On: 11/08/2015 16:33     Labs:   Basic Metabolic Panel:  Recent Labs Lab 11/08/15 0215 11/08/15 0316 11/08/15 1715 11/09/15 0430 11/10/15 0345 11/11/15 0416  NA 141  --  138 138 138 139  K 2.8*  --  2.8* 3.3* 3.4* 3.5  CL 111  --  111 111 109 110  CO2 23  --  24 24 26 25   GLUCOSE 88  --  122* 99 94 118*  BUN 28*  --  32* 33* 30* 24*  CREATININE 1.05*  --  0.95 0.88 0.87 0.91  CALCIUM 7.7*  --  7.6* 8.1* 8.5* 8.6*  MG  --  1.4* 1.9 2.0  --   --    GFR Estimated Creatinine Clearance: 75.4 mL/min (by C-G formula based on SCr of 0.91 mg/dL). Liver Function Tests:  Recent Labs Lab 11/07/15 1910 11/08/15 0215 11/09/15 0430 11/10/15 0345 11/11/15 0416  AST 64* 95* 104* 89* 64*  ALT 37 40 43 45 42  ALKPHOS 159* 112 107 145* 139*  BILITOT 8.2* 5.2* 4.1* 3.8* 3.7*  PROT 6.3* 5.0* 4.8* 4.9* 4.8*  ALBUMIN 2.9* 2.3* 2.2* 2.2* 2.1*   No results for input(s): LIPASE, AMYLASE in the last 168 hours.  Recent Labs Lab 11/07/15 1940 11/08/15 0215  AMMONIA 34 53*   Coagulation profile  Recent Labs Lab 11/07/15 1940 11/09/15 0430  INR 2.00 2.13    CBC:  Recent Labs Lab 11/07/15 1910 11/08/15 0215 11/09/15 0430 11/10/15 0345 11/11/15 0416  WBC 7.9 7.5 7.5 5.9 4.5  NEUTROABS 7.2 6.5 5.7  --   --   HGB 12.8 11.3* 10.6* 10.8* 10.8*  HCT 36.7 32.5* 30.9* 31.7* 31.5*  MCV 97.1 98.2 97.8 97.8 100.3*  PLT 34* 34* 27* 36* 34*   Cardiac Enzymes:  Recent Labs Lab 11/08/15 0200 11/08/15 0316 11/08/15 1716 11/08/15  2316 11/09/15 0430  CKTOTAL  --  1,902*  --   --  659*  TROPONINI 0.31*  --  0.24* 0.27* 0.24*   BNP: Invalid input(s): POCBNP CBG:  Recent Labs Lab 11/08/15 0541 11/08/15 1142 11/08/15 1839 11/08/15 2342 11/09/15 0643  GLUCAP 71 119* 95 106* 92   D-Dimer No results for input(s): DDIMER in the last 72 hours. Hgb A1c No results for input(s): HGBA1C in the last 72 hours. Lipid  Profile No results for input(s): CHOL, HDL, LDLCALC, TRIG, CHOLHDL, LDLDIRECT in the last 72 hours. Thyroid function studies No results for input(s): TSH, T4TOTAL, T3FREE, THYROIDAB in the last 72 hours.  Invalid input(s): FREET3 Anemia work up No results for input(s): VITAMINB12, FOLATE, FERRITIN, TIBC, IRON, RETICCTPCT in the last 72 hours. Microbiology Recent Results (from the past 240 hour(s))  Culture, blood (Routine x 2)     Status: None   Collection Time: 11/07/15  7:05 PM  Result Value Ref Range Status   Specimen Description BLOOD BLOOD RIGHT ARM  Final   Special Requests NONE  Final   Culture   Final    NO GROWTH 5 DAYS Performed at [facility]    Report Status 11/12/2015 FINAL  Final  Culture, blood (Routine x 2)     Status: None   Collection Time: 11/07/15  7:40 PM  Result Value Ref Range Status   Specimen Description BLOOD LEFT ARM  Final   Special Requests NONE  Final   Culture   Final    NO GROWTH 5 DAYS Performed at [facility]    Report Status 11/12/2015 FINAL  Final  Urine culture     Status: None   Collection Time: 11/07/15 10:02 PM  Result Value Ref Range Status   Specimen Description URINE, CLEAN CATCH  Final   Special Requests NONE  Final   Culture NO GROWTH Performed at [facility]   Final   Report Status 11/09/2015 FINAL  Final  MRSA PCR Screening     Status: None   Collection Time: 11/08/15 12:11 AM  Result Value Ref Range Status   MRSA by PCR NEGATIVE NEGATIVE Final    Comment:        The GeneXpert MRSA Assay (FDA approved for NASAL specimens only), is one component of a comprehensive MRSA colonization surveillance program. It is not intended to diagnose MRSA infection nor to guide or monitor treatment for MRSA infections.      Discharge Instructions:   Discharge Instructions    Diet - low sodium heart healthy    Complete by:  As directed   Discharge instructions    Complete by:  As directed   Fluid  restrict to 1.5L/day Elevate extremities Cbc, BMP 1 week Clindamycin until 9/11   Increase activity slowly    Complete by:  As directed       Medication List    STOP taking these medications   ALPRAZolam 0.25 MG tablet Commonly known as:  XANAX   diazepam 5 MG tablet Commonly known as:  VALIUM   HYDROcodone-acetaminophen 10-325 MG tablet Commonly known as:  NORCO   promethazine 12.5 MG tablet Commonly known as:  PHENERGAN   zolpidem 5 MG tablet Commonly known as:  AMBIEN     TAKE these medications   acetaminophen 325 MG tablet Commonly known as:  TYLENOL Take 2 tablets (650 mg total) by mouth every 6 (six) hours as needed for mild pain (or Fever >/= 101).   buPROPion  75 MG tablet Commonly known as:  WELLBUTRIN Take 75-150 mg by mouth 2 (two) times daily. 75mg  in the morning, 150mg  in the evening   clindamycin 300 MG capsule Commonly known as:  CLEOCIN Take 1 capsule (300 mg total) by mouth every 8 (eight) hours.   diphenhydrAMINE 25 mg capsule Commonly known as:  BENADRYL Take 1 capsule (25 mg total) by mouth every 6 (six) hours as needed for itching, allergies or sleep.   ergocalciferol 50000 units capsule Commonly known as:  VITAMIN D2 Take 1 capsule by mouth 2 (two) times a week.   furosemide 40 MG tablet Commonly known as:  LASIX Take by mouth daily. Take 1 (40mg ) tab in the morning and half tab (20mg ) in the evening   gabapentin 300 MG capsule Commonly known as:  NEURONTIN Take 1 capsule (300 mg total) by mouth at bedtime.   lactulose 10 GM/15ML solution Commonly known as:  CHRONULAC Take 45 g by mouth daily as needed for mild constipation.   oxyCODONE 5 MG immediate release tablet Commonly known as:  Oxy IR/ROXICODONE Take 1 tablet (5 mg total) by mouth every 4 (four) hours as needed for severe pain.   potassium chloride SA 20 MEQ tablet Commonly known as:  K-DUR,KLOR-CON Take 1 tablet (20 mEq total) by mouth 2 (two) times daily.   rifaximin  550 MG Tabs tablet Commonly known as:  XIFAXAN Take 1 tablet (550 mg total) by mouth 2 (two) times daily.   rOPINIRole 1 MG tablet Commonly known as:  REQUIP Take 2 mg by mouth at bedtime.   spironolactone 100 MG tablet Commonly known as:  ALDACTONE Take 100 mg by mouth daily.   traZODone 50 MG tablet Commonly known as:  DESYREL Take 50 mg by mouth at bedtime.         Time coordinating discharge: 35 min  Signed:  Keviana Guida U Jackelyn Illingworth   [facility] 11/13/2015, 11:21 AM

## 2015-11-13 NOTE — [place] FL2 (Signed)
[place] MEDICAID FL2 LEVEL OF CARE SCREENING TOOL     IDENTIFICATION  Patient Name: [name] Birthdate: 14-Oct-1945 Sex: female Admission Date (Current Location): 11/07/2015  [facility] and [place] Number:  Herbalist and Address:  [facility],  [place] [address] [place]      Provider Number: [id number]  Attending Physician Name and Address:  Geradine Girt, DO  Relative Name and Phone Number:       Current Level of Care: SNF Recommended Level of Care: [place] Prior Approval Number:    Date Approved/Denied:   PASRR Number:  [id number]  Discharge Plan: SNF    Current Diagnoses: Patient Active Problem List   Diagnosis Date Noted  . Sepsis (IX[place]) 11/07/2015  . Cellulitis of right lower extremity 11/07/2015  . Elevated LFTs   . Accelerated junctional rhythm 07/12/2015  . Encounter for loop recorder check 07/11/2015  . Syncope 03/28/2015  . Generalized weakness 07/09/2013  . Fall at home 07/09/2013  . S/P tooth extraction 07/09/2013  . Weakness 07/09/2013  . OSA (obstructive sleep apnea) 11/01/2012  . MVP (mitral valve prolapse) 11/01/2012  . PSVT (paroxysmal supraventricular tachycardia) ([place]) 11/01/2012  . Cirrhosis, non-alcoholic ([place]) [id number]  . HTN (hypertension) 11/01/2012  . Thrombocytopenia, unspecified ([place]) 12/14/2011  . Acute blood loss anemia 12/14/2011  . 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 12/14/2011    Orientation RESPIRATION BLADDER Height & Weight     Self, Place  Normal Indwelling catheter Weight: 227 lb 8.2 oz (103.2 kg) Height:  5\' 8"  (172.7 cm)  BEHAVIORAL SYMPTOMS/MOOD NEUROLOGICAL BOWEL NUTRITION STATUS      Continent Diet (Heart Healthy)  AMBULATORY STATUS COMMUNICATION OF NEEDS Skin   Extensive Assist Verbally Normal                       Personal Care Assistance Level of Assistance  Bathing, Feeding, Dressing Bathing Assistance: Limited assistance Feeding  assistance: Limited assistance Dressing Assistance: Limited assistance     Functional Limitations Info  Sight, Hearing, Speech Sight Info: Adequate Hearing Info: Adequate Speech Info: Adequate    SPECIAL CARE FACTORS FREQUENCY  PT (By licensed PT)     PT Frequency: 5              Contractures Contractures Info: Not present    Additional Factors Info  Code Status, Allergies, Psychotropic Code Status Info: Full Code Allergies Info: Ondansetron Hcl, Penicillins, Aspirin, Midazolam Hcl, Niacin And Related           Current Medications (11/13/2015):  This is the current hospital active medication list Current Facility-Administered Medications  Medication Dose Route Frequency Provider Last Rate Last Dose  . acetaminophen (TYLENOL) tablet 650 mg  650 mg Oral Q6H PRN Rise Patience, MD   650 mg at 11/12/15 0617   Or  . acetaminophen (TYLENOL) suppository 650 mg  650 mg Rectal Q6H PRN Rise Patience, MD      . alum & mag hydroxide-simeth (MAALOX/MYLANTA) 200-200-20 MG/5ML suspension 30 mL  30 mL Oral PRN Geradine Girt, DO   30 mL at 11/10/15 2341  . buPROPion [facility]) tablet 150 mg  150 mg Oral QHS Lavina Hamman, MD   150 mg at 11/12/15 2158  . buPROPion [facility]) tablet 75 mg  75 mg Oral Daily Lavina Hamman, MD   75 mg at 11/13/15 0941  . clindamycin (CLEOCIN) capsule 300 mg  300 mg Oral Q8H Jessica  U Vann, DO   300 mg at 11/13/15 0541  . diphenhydrAMINE (BENADRYL) capsule 25 mg  25 mg Oral Q6H PRN Gardiner Barefoot, NP   25 mg at 11/12/15 2157  . furosemide (LASIX) tablet 20 mg  20 mg Oral QPM Geradine Girt, DO   20 mg at 11/12/15 1722  . furosemide (LASIX) tablet 40 mg  40 mg Oral Daily Jessica U Vann, DO   40 mg at 11/12/15 1016  . gabapentin (NEURONTIN) capsule 300 mg  300 mg Oral QHS Jessica U Vann, DO      . oxyCODONE (Oxy IR/ROXICODONE) immediate release tablet 5 mg  5 mg Oral Q4H PRN Gardiner Barefoot, NP   5 mg at 11/13/15 0941  . potassium  chloride SA (K-DUR,KLOR-CON) CR tablet 20 mEq  20 mEq Oral BID Geradine Girt, DO   40 mEq at 11/13/15 0946  . potassium chloride SA (K-DUR,KLOR-CON) CR tablet 40 mEq  40 mEq Oral Once Geradine Girt, DO      . rifaximin Doreene Nest) tablet 550 mg  550 mg Oral BID Geradine Girt, DO   550 mg at 11/13/15 0941  . rOPINIRole (REQUIP) tablet 2 mg  2 mg Oral QHS Lavina Hamman, MD   2 mg at 11/12/15 2158  . sodium chloride flush (NS) 0.9 % injection 10-40 mL  10-40 mL Intracatheter Q12H Lavina Hamman, MD   10 mL at 11/12/15 2213  . sodium chloride flush (NS) 0.9 % injection 10-40 mL  10-40 mL Intracatheter PRN Lavina Hamman, MD   10 mL at 11/12/15 0451  . spironolactone (ALDACTONE) tablet 100 mg  100 mg Oral Daily Geradine Girt, DO   100 mg at 11/13/15 [id number]     Discharge Medications: Please see discharge summary for a list of discharge medications.  Relevant Imaging Results:  Relevant Lab Results:   Additional Information ss#  Adelaido Nicklaus A, LCSW

## 2015-11-13 NOTE — Clinical Social Work Placement (Signed)
   CLINICAL SOCIAL WORK PLACEMENT  NOTE  Date:  11/13/2015  Patient Details  Name: [name] MRN: [id number] Date of Birth: [birth year]  Clinical Social Work is seeking post-discharge placement for this patient at the [place] level of care (*CSW will initial, date and re-position this form in  chart as items are completed):  Yes   Patient/family provided with [place] Work Department's list of facilities offering this level of care within the geographic area requested by the patient (or if unable, by the patient's family).  Yes   Patient/family informed of their freedom to choose among providers that offer the needed level of care, that participate in Medicare, Medicaid or managed care program needed by the patient, have an available bed and are willing to accept the patient.  Yes   Patient/family informed of [facility]'s ownership interest in [facility] and [facility], as well as of the fact that they are under no obligation to receive care at these facilities.  PASRR submitted to EDS on 11/12/15 ([place] Review)     PASRR number received on 11/13/15     Existing PASRR number confirmed on       FL2 transmitted to all facilities in geographic area requested by pt/family on 11/11/15     FL2 transmitted to all facilities within larger geographic area on 11/12/15     Patient informed that his/her managed care company has contracts with or will negotiate with certain facilities, including the following:        Yes (11/12/15)   Patient/family informed of bed offers received.  Patient chooses bed at [facility]     Physician recommends and patient chooses bed at      Patient to be transferred to [facility] on 11/13/15.  Patient to be transferred to facility by [facility]     Patient family notified on 11/13/15 of transfer.  Name of family member notified:  Alveria Apley, at bedside      PHYSICIAN    Additional Comment:    _______________________________________________ Edson Snowball, LCSW 11/13/2015, 11:51 AM

## 2015-11-13 NOTE — Progress Notes (Signed)
Patient ready for admission at [facility]. Pt to be transported via [facility]. Pt son and family aware. Patient thankful for support. Patient nurse can call report to [phone].   Belia Heman, LCSW  Clinical Social Work   [phone]

## 2015-11-13 NOTE — Progress Notes (Signed)
CSW spoke with [facility], they are reviewing dc summary. CSW awaiting call back regarding time of admission for patient at [facility]. CSW updated patient, and patient family at beside. Pt anticipated to dc this afternoon.   Belia Heman, LCSW  Clinical Social Work   [phone]

## 2015-11-14 ENCOUNTER — Non-Acute Institutional Stay (SKILLED_NURSING_FACILITY): Payer: Medicare Other | Admitting: Internal Medicine

## 2015-11-14 ENCOUNTER — Encounter: Payer: Self-pay | Admitting: Internal Medicine

## 2015-11-14 DIAGNOSIS — K7290 Hepatic failure, unspecified without coma: Secondary | ICD-10-CM | POA: Diagnosis not present

## 2015-11-14 DIAGNOSIS — E876 Hypokalemia: Secondary | ICD-10-CM | POA: Diagnosis not present

## 2015-11-14 DIAGNOSIS — G2581 Restless legs syndrome: Secondary | ICD-10-CM

## 2015-11-14 DIAGNOSIS — M6282 Rhabdomyolysis: Secondary | ICD-10-CM

## 2015-11-14 DIAGNOSIS — R7989 Other specified abnormal findings of blood chemistry: Secondary | ICD-10-CM

## 2015-11-14 DIAGNOSIS — K7460 Unspecified cirrhosis of liver: Secondary | ICD-10-CM

## 2015-11-14 DIAGNOSIS — E559 Vitamin D deficiency, unspecified: Secondary | ICD-10-CM

## 2015-11-14 DIAGNOSIS — E8779 Other fluid overload: Secondary | ICD-10-CM

## 2015-11-14 DIAGNOSIS — K7682 Hepatic encephalopathy: Secondary | ICD-10-CM

## 2015-11-14 DIAGNOSIS — D696 Thrombocytopenia, unspecified: Secondary | ICD-10-CM

## 2015-11-14 DIAGNOSIS — L03115 Cellulitis of right lower limb: Secondary | ICD-10-CM

## 2015-11-14 DIAGNOSIS — D689 Coagulation defect, unspecified: Secondary | ICD-10-CM

## 2015-11-14 DIAGNOSIS — R945 Abnormal results of liver function studies: Secondary | ICD-10-CM

## 2015-11-14 DIAGNOSIS — A419 Sepsis, unspecified organism: Secondary | ICD-10-CM

## 2015-11-14 NOTE — Progress Notes (Signed)
MRN: [id number] Name: [name]  Sex: female Age: 70 y.o. DOB: September 23, 1945  [place] #:  Facility/Room: [facility] / [id number] P Level Of Care: SNF Provider: Noah Delaine. Sheppard Coil, MD Emergency Contacts: Extended Emergency Contact Information Primary Emergency Contact: Hess,Jennifer Address: [place], [place] [address] [place] Phone: [phone] Work Phone: [phone] Mobile Phone: [phone] Relation: Daughter Secondary Emergency Contact: Thurmond Butts, [place] [address] [place] Mobile Phone: [phone] Relation: Son  Code Status: Full Code  Allergies: Ondansetron hcl; Penicillins; Aspirin; Midazolam hcl; and Niacin and related  Chief Complaint  Patient presents with  . New Admit To SNF    Admit to Facility    HPI: Patient is 70 y.o. female with nonalcoholic liver cirrhosis, history of PSVT, chronic thrombocytopenia was brought to the ER after patient was found to be getting weak over the last few days.Pt had no specific c/o but was noted to have RLE redness,  c/w celllulitis. Pt was admitted to [facility] from 9/4-10 where she was treated for cellulitis with IV vanc and levaquin then transitioned to po clindamycin. Pt 's hospital course was complicated by hypervolemia,  thrombocytopenia and coagulopathy. Pt is admitted to SNF with profound weakness, for OT/PT. While at SNF pt will be followed for RLS, tx with requip, cirrhosis of the liver ,tx with with lasix and spironolactone and rifaximin and vit D def, tx with replacement.  Past Medical History:  Diagnosis Date  . Anxiety   . Asthma   . Blood dyscrasia    low platelets ... low wbc.  ? liver  disease.   . Cancer [facility]) 2005   left breast  . Chronic kidney disease 50's   nephritis--none at present *12/03/2011)  . Cirrhosis ([place]) 2.5 yrs   never a drinker.  platelets  low... wbc also low per pt ..   . Complication of anesthesia    had difficulty going to sleep, staying  asleep during surgery and was combative after procedure.  . Depression   . Diabetes mellitus    no meds  . Family history of anesthesia complication    Daughter has complications with Versed  . H/O hiatal hernia   . Headache(784.0)   . Heart murmur   . Hypertension   . Platelets decreased ([place])    ??   D/T CIRRHOISIS  . Portal hypertension ([place])   . PSVT (paroxysmal supraventricular tachycardia) ([place])   . Restless leg   . Shortness of breath    with exertion   . Sleep apnea 2013   sleep study at [facility]    uses cpap    Past Surgical History:  Procedure Laterality Date  . ABDOMINAL HYSTERECTOMY    . APPENDECTOMY    . BREAST SURGERY     left lumpectomy--few nodes removed  . CARDIAC CATHETERIZATION  1975   normal cath  -low potassium  . CHOLECYSTECTOMY    . COLONOSCOPY W/ BIOPSIES AND POLYPECTOMY    . COLONOSCOPY WITH PROPOFOL N/A 10/21/2013   Procedure: COLONOSCOPY WITH PROPOFOL;  Surgeon: [name], MD;  Location: [facility] ENDOSCOPY;  Service: Endoscopy;  Laterality: N/A;  ultra slim scope   . EP IMPLANTABLE DEVICE N/A 04/05/2015   Procedure: Loop Recorder Insertion;  Surgeon: Sanda Klein, MD;  Location: [facility] CV LAB;  Service: Cardiovascular;  Laterality: N/A;  . ESOPHAGOGASTRODUODENOSCOPY (EGD) WITH PROPOFOL N/A 10/21/2013  Procedure: ESOPHAGOGASTRODUODENOSCOPY (EGD) WITH PROPOFOL;  Surgeon: [name], MD;  Location[facility] ENDOSCOPY;  Service: Endoscopy;  Laterality: N/A;  . IR GENERIC HISTORICAL  11/08/2015   IR FLUORO GUIDE CV LINE RIGHT 11/08/2015 [facility]INTER[facility] RAD  . IR GENERIC HISTORICAL  11/08/2015   IR US GUIDE VASC ACCESS RIGHT 11/08/2015 [facility]  . MASTECTOMY    . NM MYOCAR PERF WALL MOTION  08/17/2008   mild to mod. superimposed ischemiamid antereoseptal,apical anterior & apical regions  . ORIF HUMERUS FRACTURE  06/19/2011   Procedure: OPEN REDUCTION INTERNAL FIXATION (ORIF) DISTAL HUMERUS FRACTURE;  Surgeon: [name], MD;  Location: [facility];  Service:  Orthopedics;  Laterality: Left;  . ORIF HUMERUS FRACTURE  12/11/2011   Procedure: OPEN REDUCTION INTERNAL FIXATION (ORIF) HUMERAL SHAFT FRACTURE;  Surgeon: [name], MD;  Location: [facility];  Service: Orthopedics;  Laterality: Left;   Removal of Hardware left humerus, Open Reduction Internal Fixation Left Humeral Shaft Fracture Non-Union  . TONSILLECTOMY    . US ECHOCARDIOGRAPHY  03/16/2011   mild concentric LVH,mod LAE,stage I diastolic dysfunction,MAC,mild MR,aortic sclerosis,mild TR      Medication List       Accurate as of 11/14/15 11:59 PM. Always use your most recent med list.          acetaminophen 325 MG tablet Commonly known as:  TYLENOL Take 2 tablets (650 mg total) by mouth every 6 (six) hours as needed for mild pain (or Fever >/= 101).   buPROPion 75 MG tablet Commonly known as:  WELLBUTRIN Take 75-150 mg by mouth 2 (two) times daily. 75mg  in the morning, 150mg  in the evening   clindamycin 300 MG capsule Commonly known as:  CLEOCIN Take 1 capsule (300 mg total) by mouth every 8 (eight) hours.   diphenhydrAMINE 25 mg capsule Commonly known as:  BENADRYL Take 1 capsule (25 mg total) by mouth every 6 (six) hours as needed for itching, allergies or sleep.   ergocalciferol 50000 units capsule Commonly known as:  VITAMIN D2 Take 1 capsule by mouth 2 (two) times a week.   furosemide 40 MG tablet Commonly known as:  LASIX Take by mouth daily. Take 1 (40mg ) tab in the morning and half tab (20mg ) in the evening   gabapentin 300 MG capsule Commonly known as:  NEURONTIN Take 1 capsule (300 mg total) by mouth at bedtime.   lactulose 10 GM/15ML solution Commonly known as:  CHRONULAC Take 45 g by mouth daily as needed for mild constipation.   oxyCODONE 5 MG immediate release tablet Commonly known as:  Oxy IR/ROXICODONE Take 1 tablet (5 mg total) by mouth every 4 (four) hours as needed for severe pain.   potassium chloride SA 20 MEQ tablet Commonly known as:   K-DUR,KLOR-CON Take 1 tablet (20 mEq total) by mouth 2 (two) times daily.   rifaximin 550 MG Tabs tablet Commonly known as:  XIFAXAN Take 1 tablet (550 mg total) by mouth 2 (two) times daily.   rOPINIRole 1 MG tablet Commonly known as:  REQUIP Take 2 mg by mouth at bedtime.   spironolactone 100 MG tablet Commonly known as:  ALDACTONE Take 100 mg by mouth daily.   traZODone 50 MG tablet Commonly known as:  DESYREL Take 50 mg by mouth at bedtime.       No orders of the defined types were placed in this encounter.   Immunization History  Administered Date(s) Administered  . PPD Test 11/13/2015    Social History  Substance Use Topics  . Smoking status: Never Smoker  . Smokeless tobacco: Never Used  . Alcohol use No    Family history is   Family History  Problem Relation Age of Onset  . Stroke Father   . Anesthesia problems Daughter   . Hyperlipidemia Brother       Review of Systems  DATA OBTAINED: from patient GENERAL:  no fevers, fatigue, appetite changes SKIN: RUE redness is worse, R leg is better EYES: No eye pain, redness, discharge EARS: No earache, tinnitus, change in hearing NOSE: No congestion, drainage or bleeding  MOUTH/THROAT: No mouth or tooth pain, No sore throat RESPIRATORY: No cough, wheezing, SOB CARDIAC: No chest pain, palpitations, lower extremity edema  GI: No abdominal pain, No N/V/D or constipation, No heartburn or reflux  GU: No dysuria, frequency or urgency, or incontinence  MUSCULOSKELETAL: aches all over NEUROLOGIC: No headache, dizziness or focal weakness PSYCHIATRIC: No c/o anxiety or sadness   Vitals:   11/14/15 0839  BP: 120/60  Pulse: 76  Resp: 20  Temp: 97.9 F (36.6 C)    SpO2 Readings from Last 1 Encounters:  11/15/15 97%        Physical Exam  GENERAL APPEARANCE: Alert, conversant,  mininmal acute distress.  SKIN: bruise RUE with redness, swelling and heat and heat; RLE wit redness and heat, heat <  RUE HEAD: Normocephalic, atraumatic   EYES: Conjunctiva/lids clear. Pupils round, reactive. EOMs intact.  EARS: External exam WNL, canals clear. Hearing grossly normal.  NOSE: No deformity or discharge.  MOUTH/THROAT: Lips w/o lesions  RESPIRATORY: Breathing is even, unlabored. Lung sounds are clear   CARDIOVASCULAR: Heart RRR no murmurs, rubs or gallops. No peripheral edema.   GASTROINTESTINAL: Abdomen is soft, non-tender, not distended w/ normal bowel sounds. GENITOURINARY: Bladder non tender, not distended  MUSCULOSKELETAL: No abnormal joints or musculature NEUROLOGIC:  Cranial nerves 2-12 grossly intact. Moves all extremities  PSYCHIATRIC: Mood and affect appropriate to situation, no behavioral issues  Patient Active Problem List   Diagnosis Date Noted  . Sepsis ([place]) 11/07/2015  . Cellulitis of right lower extremity 11/07/2015  . Elevated LFTs   . Accelerated junctional rhythm 07/12/2015  . Encounter for loop recorder check 07/11/2015  . Syncope 03/28/2015  . Generalized weakness 07/09/2013  . Fall at home 07/09/2013  . S/P tooth extraction 07/09/2013  . Weakness 07/09/2013  . OSA (obstructive sleep apnea) 11/01/2012  . MVP (mitral valve prolapse) 11/01/2012  . PSVT (paroxysmal supraventricular tachycardia) ([place]) 11/01/2012  . Cirrhosis, non-alcoholic ([place]) [id number]  . HTN (hypertension) 11/01/2012  . Thrombocytopenia, unspecified ([place]) 12/14/2011  . Acute blood loss anemia 12/14/2011  . 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 12/14/2011       Component Value Date/Time   WBC 4.5 11/11/2015 0416   RBC 3.14 (L) 11/11/2015 0416   HGB 10.8 (L) 11/11/2015 0416   HGB 13.1 02/17/2010 1128   HCT 31.5 (L) 11/11/2015 0416   HCT 38.3 02/17/2010 1128   PLT 34 (L) 11/11/2015 0416   PLT 74 (L) 02/17/2010 1128   MCV 100.3 (H) 11/11/2015 0416   MCV 90.1 02/17/2010 1128   LYMPHSABS 1.0 11/09/2015 0430   LYMPHSABS 0.9 02/17/2010 1128   MONOABS 0.8 11/09/2015 0430   MONOABS  0.2 02/17/2010 1128   EOSABS 0.0 11/09/2015 0430   EOSABS 0.0 02/17/2010 1128   BASOSABS 0.0 11/09/2015 0430   BASOSABS 0.0 02/17/2010 1128        Component Value Date/Time   NA 139  11/11/2015 0416   NA 139 11/11/2015   K 3.5 11/11/2015 0416   CL 110 11/11/2015 0416   CO2 25 11/11/2015 0416   GLUCOSE 118 (H) 11/11/2015 0416   BUN 24 (H) 11/11/2015 0416   BUN 24 (A) 11/11/2015   CREATININE 0.91 11/11/2015 0416   CREATININE 0.66 01/07/2013 1427   CALCIUM 8.6 (L) 11/11/2015 0416   PROT 4.8 (L) 11/11/2015 0416   ALBUMIN 2.1 (L) 11/11/2015 0416   AST 64 (H) 11/11/2015 0416   ALT 42 11/11/2015 0416   ALKPHOS 139 (H) 11/11/2015 0416   BILITOT 3.7 (H) 11/11/2015 0416   GFRNONAA >60 11/11/2015 0416   GFRAA >60 11/11/2015 0416    No results found for: HGBA1C  No results found for: CHOL, HDL, LDLCALC, LDLDIRECT, TRIG, CHOLHDL   Dg Chest 2 View  Result Date: 11/07/2015 CLINICAL DATA:  70 year old female with weakness, fever, and headache EXAM: CHEST  2 VIEW COMPARISON:  Chest radiograph dated 06/14/2013 FINDINGS: Two views of the chest do not demonstrate a focal consolidation. There is no pleural effusion or pneumothorax. Stable normal cardiac silhouette. No acute osseous pathology identified. Partially visualized left proximal humeral fixation plate and screws. Multiple surgical clips noted in the left breast area. IMPRESSION: No active cardiopulmonary disease. Electronically Signed   By: Anner Crete M.D.   On: 11/07/2015 19:30   Ct Head Wo Contrast  Result Date: 11/07/2015 CLINICAL DATA:  Acute onset of generalized weakness and fever. Altered mental status. Initial encounter. EXAM: CT HEAD WITHOUT CONTRAST TECHNIQUE: Contiguous axial images were obtained from the base of the skull through the vertex without intravenous contrast. COMPARISON:  None. FINDINGS: Brain: No evidence of acute infarction, hemorrhage, hydrocephalus, extra-axial collection or mass lesion/mass effect. The  posterior fossa, including the cerebellum, brainstem and fourth ventricle, is within normal limits. The third and lateral ventricles, and basal ganglia are unremarkable in appearance. The cerebral hemispheres are symmetric in appearance, with normal gray-white differentiation. No mass effect or midline shift is seen. Vascular: No hyperdense vessel or unexpected calcification. Skull: There is no evidence of fracture; visualized osseous structures are unremarkable in appearance. Sinuses/Orbits: The visualized portions of the orbits are within normal limits. The paranasal sinuses and mastoid air cells are well-aerated. Other: No significant soft tissue abnormalities are seen. IMPRESSION: Unremarkable noncontrast CT of the head. Electronically Signed   By: Garald Balding M.D.   On: 11/07/2015 23:29   US Abdomen Complete  Result Date: 11/08/2015 CLINICAL DATA:  Evaluate for portal vein thrombosis. Elevated LFTs. Initial encounter. EXAM: EXAM ULTRASOUND ABDOMEN COMPLETE DUPLEX ULTRASOUND OF LIVER TECHNIQUE: Complete abdominal ultrasound examination was performed. Color and duplex Doppler ultrasound was also performed to evaluate the hepatic in-flow and out-flow vessels. COMPARISON:  CT of the abdomen and pelvis performed 10/01/2013, and abdominal ultrasound performed 04/09/2014 FINDINGS: Portal Vein Velocities Main: 31.2 cm/sec Right: 15.7  cm/sec Left: 13.3 cm/sec Hepatic Vein Velocities Right: 25.5 cm/sec Middle: 30.7 cm/sec Left: 22.6 cm/sec Hepatic artery velocity: 75.2 cm/sec Splenic vein velocity: 29.0 cm/sec Varices:  Known mild varices are difficult to fully characterize. Ascites: None seen. Vascular structures: There appears to be a left-sided recanalized umbilical vein. Normal hepatopetal flow is seen within the portal venous system, while normal triphasic antegrade flow is noted within the hepatic veins. There is no evidence of thrombosis. Gallbladder: Status post cholecystectomy.  No retained stones seen.  Common bile duct: Diameter: 1.3 cm, likely within normal limits status post cholecystectomy. Liver: No focal lesion identified. Coarsened echotexture likely  reflects fatty infiltration. The nodular contour raises concern for mild hepatic cirrhosis. IVC: No abnormality visualized. Pancreas: Not visualized due to overlying bowel gas. Spleen: Enlarged, measuring 18.4 cm in length. Right Kidney: Length: 11.2 cm. Echogenicity within normal limits. No mass or hydronephrosis visualized. Left Kidney: Length: 11.6 cm. Echogenicity within normal limits. No mass or hydronephrosis visualized. Abdominal aorta: No aneurysm visualized. Not characterized distally due to overlying bowel gas. Other Findings: None. IMPRESSION: 1. No evidence of portal vein thrombosis. 2. Changes of mild hepatic cirrhosis, with recanalized left-sided umbilical vein. 3. Normal hepatopetal flow within the portal venous system, and normal triphasic vascular flow within the hepatic veins. 4. Splenomegaly noted. 5. Known mild varices are difficult to fully characterize. Electronically Signed   By: Garald Balding M.D.   On: 11/08/2015 02:27   Dg Pelvis Portable  Result Date: 11/07/2015 CLINICAL DATA:  Status post fall, with concern for pelvic injury. Initial encounter. EXAM: PORTABLE PELVIS 1-2 VIEWS COMPARISON:  CT of the abdomen and pelvis performed 06/03/2013 FINDINGS: There is no evidence of fracture or dislocation. Both femoral heads are seated normally within their respective acetabula. No significant degenerative change is appreciated. The sacroiliac joints are unremarkable in appearance. The visualized bowel gas pattern is grossly unremarkable in appearance. Clips are seen overlying the left inguinal region. IMPRESSION: No evidence of fracture or dislocation. Electronically Signed   By: Garald Balding M.D.   On: 11/07/2015 23:21   [place] Art/ven Flow Abd Pelv Doppler  Result Date: 11/08/2015 CLINICAL DATA:  Evaluate for portal vein thrombosis.  Elevated LFTs. Initial encounter. EXAM: EXAM ULTRASOUND ABDOMEN COMPLETE DUPLEX ULTRASOUND OF LIVER TECHNIQUE: Complete abdominal ultrasound examination was performed. Color and duplex Doppler ultrasound was also performed to evaluate the hepatic in-flow and out-flow vessels. COMPARISON:  CT of the abdomen and pelvis performed 10/01/2013, and abdominal ultrasound performed 04/09/2014 FINDINGS: Portal Vein Velocities Main: 31.2 cm/sec Right: 15.7  cm/sec Left: 13.3 cm/sec Hepatic Vein Velocities Right: 25.5 cm/sec Middle: 30.7 cm/sec Left: 22.6 cm/sec Hepatic artery velocity: 75.2 cm/sec Splenic vein velocity: 29.0 cm/sec Varices:  Known mild varices are difficult to fully characterize. Ascites: None seen. Vascular structures: There appears to be a left-sided recanalized umbilical vein. Normal hepatopetal flow is seen within the portal venous system, while normal triphasic antegrade flow is noted within the hepatic veins. There is no evidence of thrombosis. Gallbladder: Status post cholecystectomy.  No retained stones seen. Common bile duct: Diameter: 1.3 cm, likely within normal limits status post cholecystectomy. Liver: No focal lesion identified. Coarsened echotexture likely reflects fatty infiltration. The nodular contour raises concern for mild hepatic cirrhosis. IVC: No abnormality visualized. Pancreas: Not visualized due to overlying bowel gas. Spleen: Enlarged, measuring 18.4 cm in length. Right Kidney: Length: 11.2 cm. Echogenicity within normal limits. No mass or hydronephrosis visualized. Left Kidney: Length: 11.6 cm. Echogenicity within normal limits. No mass or hydronephrosis visualized. Abdominal aorta: No aneurysm visualized. Not characterized distally due to overlying bowel gas. Other Findings: None. IMPRESSION: 1. No evidence of portal vein thrombosis. 2. Changes of mild hepatic cirrhosis, with recanalized left-sided umbilical vein. 3. Normal hepatopetal flow within the portal venous system, and  normal triphasic vascular flow within the hepatic veins. 4. Splenomegaly noted. 5. Known mild varices are difficult to fully characterize. Electronically Signed   By: Garald Balding M.D.   On: 11/08/2015 02:27   Ir Fluoro Guide Cv Line Right  Result Date: 11/08/2015 CLINICAL DATA:  Sepsis, needs durable venous access. EXAM: PICC PLACEMENT WITH ULTRASOUND AND  FLUOROSCOPY FLUOROSCOPY TIME:  6 seconds, 1 mGy TECHNIQUE: After written informed consent was obtained, patient was placed in the supine position on angiographic table. Patency of the right basilic vein was confirmed with ultrasound with image documentation. An appropriate skin site was determined. Skin site was marked. Region was prepped using maximum barrier technique including cap and mask, sterile gown, sterile gloves, large sterile sheet, and Chlorhexidine as cutaneous antisepsis. The region was infiltrated locally with 1% lidocaine. Under real-time ultrasound guidance, the right basilic vein was accessed with a 21 gauge micropuncture needle; the needle tip within the vein was confirmed with ultrasound image documentation. Needle exchanged over a 018 guidewire for a peel-away sheath, through which a 5-French double-lumen power injectable PICC trimmed to 38cm was advanced, positioned with its tip near the cavoatrial junction. Spot chest radiograph confirms appropriate catheter position. Catheter was flushed per protocol and secured externally. The patient tolerated procedure well. COMPLICATIONS: COMPLICATIONS none IMPRESSION: 1. Technically successful five [place] double lumen power injectable PICC placement Electronically Signed   By: Lucrezia Europe M.D.   On: 11/08/2015 16:33   Ir US Guide Vasc Access Right  Result Date: 11/08/2015 CLINICAL DATA:  Sepsis, needs durable venous access. EXAM: PICC PLACEMENT WITH ULTRASOUND AND FLUOROSCOPY FLUOROSCOPY TIME:  6 seconds, 1 mGy TECHNIQUE: After written informed consent was obtained, patient was placed in the  supine position on angiographic table. Patency of the right basilic vein was confirmed with ultrasound with image documentation. An appropriate skin site was determined. Skin site was marked. Region was prepped using maximum barrier technique including cap and mask, sterile gown, sterile gloves, large sterile sheet, and Chlorhexidine as cutaneous antisepsis. The region was infiltrated locally with 1% lidocaine. Under real-time ultrasound guidance, the right basilic vein was accessed with a 21 gauge micropuncture needle; the needle tip within the vein was confirmed with ultrasound image documentation. Needle exchanged over a 018 guidewire for a peel-away sheath, through which a 5-French double-lumen power injectable PICC trimmed to 38cm was advanced, positioned with its tip near the cavoatrial junction. Spot chest radiograph confirms appropriate catheter position. Catheter was flushed per protocol and secured externally. The patient tolerated procedure well. COMPLICATIONS: COMPLICATIONS none IMPRESSION: 1. Technically successful five [place] double lumen power injectable PICC placement Electronically Signed   By: Lucrezia Europe M.D.   On: 11/08/2015 16:33    Not all labs, radiology exams or other studies done during hospitalization come through on my EPIC note; however they are reviewed by me.    Assessment and Plan  SEPSIS/ RLE CELLULITIS - tx with IV vanc and levaquib, improved and switched to po clinda; doppler U/S neg for DVT;blood cx neg SNF - admit for OT/PTcont clinda until [id number]  NON ALCOHOLIC CIRRHOSIS/HEPATIC ENCAPHALOPATHY/ COAGULOPATHY/ hpervolemia- neg CT head , no focal deficits,pt has fallen , no fx, pt witH WITH COAGULOPATHY, Vit K was given, abn bili and LFTs; coagulopathy imprved and lactulose was d/c in favor of rifaximin;  SNF - cont lasix and spironolactone, rixaimin ; lactulose shows p on d/c meds. Will cont and follow pt's BM  THROMBOCYTOPENIA snf - WILL FOLLOW plt ON  CBC  HYPOKALEMIA- replaced SNF - cont rreplacement;f/u BMP  RHABDOMYOLISIS/ELEVATED TROPONINS - etiology unclear, no renal damage ECHO normal, prob demand ischemia  RLS SNF - controlled, cont  requip  VIT D DEF SNF - cont with replacement 50,000u twice weekly    Time spent > 45 min;> 50% of time with patient was spent reviewing records, labs, tests and studies,  counseling and developing plan of care  Noah Delaine. Sheppard Coil, MD

## 2015-11-15 ENCOUNTER — Encounter: Payer: Self-pay | Admitting: Internal Medicine

## 2015-11-15 ENCOUNTER — Non-Acute Institutional Stay (SKILLED_NURSING_FACILITY): Payer: Medicare Other | Admitting: Internal Medicine

## 2015-11-15 DIAGNOSIS — M7989 Other specified soft tissue disorders: Secondary | ICD-10-CM | POA: Diagnosis not present

## 2015-11-15 DIAGNOSIS — G2581 Restless legs syndrome: Secondary | ICD-10-CM | POA: Insufficient documentation

## 2015-11-15 DIAGNOSIS — M6282 Rhabdomyolysis: Secondary | ICD-10-CM | POA: Insufficient documentation

## 2015-11-15 DIAGNOSIS — E876 Hypokalemia: Secondary | ICD-10-CM | POA: Insufficient documentation

## 2015-11-15 DIAGNOSIS — K7290 Hepatic failure, unspecified without coma: Secondary | ICD-10-CM | POA: Insufficient documentation

## 2015-11-15 DIAGNOSIS — L03115 Cellulitis of right lower limb: Secondary | ICD-10-CM

## 2015-11-15 DIAGNOSIS — K7682 Hepatic encephalopathy: Secondary | ICD-10-CM | POA: Insufficient documentation

## 2015-11-15 DIAGNOSIS — E8779 Other fluid overload: Secondary | ICD-10-CM | POA: Insufficient documentation

## 2015-11-15 DIAGNOSIS — D689 Coagulation defect, unspecified: Secondary | ICD-10-CM | POA: Insufficient documentation

## 2015-11-15 DIAGNOSIS — L03113 Cellulitis of right upper limb: Secondary | ICD-10-CM

## 2015-11-15 DIAGNOSIS — E559 Vitamin D deficiency, unspecified: Secondary | ICD-10-CM | POA: Insufficient documentation

## 2015-11-15 NOTE — Progress Notes (Signed)
Location:  [place] Room Number: [place] of Service:  SNF (31)  Noah Delaine. Sheppard Coil, MD  Patient Care Team: Reynold Bowen, MD as PCP - General (Endocrinology)  Extended Emergency Contact Information Primary Emergency Contact: Hess,Jennifer Address: [place], [place] [address] [place] of [place] Phone: [phone] Work Phone: [phone] Mobile Phone: [phone] Relation: Daughter Secondary Emergency Contact: Thurmond Butts, [place] [address] [place] Phone: [phone] Relation: Son   Risk analyst Complaint  Patient presents with  . Acute Visit    Acute    HPI:  Pt is a 70 y.o. female seen today for RUE cellulitis, and RLE cellulitis. Pt was seen yesterday for both . RUE cellulitis situated over a bruise in medial arm and today I can see a puncture mark that looks like where a PICC line was even though pt said it was in R upper chest. Th redness is better  today, less heat but  the the R arm is larger than L , although it too has many bruises. The RLE redness is worse today and heat is greater Last clindamycin was yesterday. Pt admits to more pain in RLE   Past Medical History:  Diagnosis Date  . Anxiety   . Asthma   . Blood dyscrasia    low platelets ... low wbc.  ? liver  disease.   . Cancer [facility] 2005   left breast  . Chronic kidney disease 50's   nephritis--none at present *12/03/2011)  . Cirrhosis ([place]) 2.5 yrs   never a drinker.  platelets  low... wbc also low per pt ..   . Complication of anesthesia    had difficulty going to sleep, staying asleep during surgery and was combative after procedure.  . Depression   . Diabetes mellitus    no meds  . Family history of anesthesia complication    Daughter has complications with Versed  . H/O hiatal hernia   . Headache(784.0)   . Heart murmur   . Hypertension   . Platelets decreased ([place])    ??   D/T CIRRHOISIS  . Portal  hypertension ([place])   . PSVT (paroxysmal supraventricular tachycardia) ([place])   . Restless leg   . Shortness of breath    with exertion   . Sleep apnea 2013   sleep study at [facility]    uses cpap   Past Surgical History:  Procedure Laterality Date  . ABDOMINAL HYSTERECTOMY    . APPENDECTOMY    . BREAST SURGERY     left lumpectomy--few nodes removed  . CARDIAC CATHETERIZATION  1975   normal cath  -low potassium  . CHOLECYSTECTOMY    . COLONOSCOPY W/ BIOPSIES AND POLYPECTOMY    . COLONOSCOPY WITH PROPOFOL N/A 10/21/2013   Procedure: COLONOSCOPY WITH PROPOFOL;  Surgeon: [name], MD;  Location: [facility] ENDOSCOPY;  Service: Endoscopy;  Laterality: N/A;  ultra slim scope   . EP IMPLANTABLE DEVICE N/A 04/05/2015   Procedure: Loop Recorder Insertion;  Surgeon: Sanda Klein, MD;  Location: [place] CV LAB;  Service: Cardiovascular;  Laterality: N/A;  . ESOPHAGOGASTRODUODENOSCOPY (EGD) WITH PROPOFOL N/A 10/21/2013   Procedure: ESOPHAGOGASTRODUODENOSCOPY (EGD) WITH PROPOFOL;  Surgeon: [name], MD;  Location: [facility] ENDOSCOPY;  Service: Endoscopy;  Laterality: N/A;  . IR GENERIC HISTORICAL  11/08/2015   IR FLUORO GUIDE CV LINE RIGHT 11/08/2015  [facility]-INTERV RAD  . IR GENERIC HISTORICAL  11/08/2015   IR US GUIDE VASC ACCESS RIGHT 11/08/2015 [facility]-INTERV RAD  . MASTECTOMY    . NM MYOCAR PERF WALL MOTION  08/17/2008   mild to mod. superimposed ischemiamid antereoseptal,apical anterior & apical regions  . ORIF HUMERUS FRACTURE  06/19/2011   Procedure: OPEN REDUCTION INTERNAL FIXATION (ORIF) DISTAL HUMERUS FRACTURE;  Surgeon: [name], MD;  Location: [place];  Service: Orthopedics;  Laterality: Left;  . ORIF HUMERUS FRACTURE  12/11/2011   Procedure: OPEN REDUCTION INTERNAL FIXATION (ORIF) HUMERAL SHAFT FRACTURE;  Surgeon: [name], MD;  Location: [place];  Service: Orthopedics;  Laterality: Left;   Removal of Hardware left humerus, Open Reduction Internal Fixation Left Humeral Shaft Fracture  Non-[place]  . TONSILLECTOMY    . US ECHOCARDIOGRAPHY  03/16/2011   mild concentric LVH,mod LAE,stage I diastolic dysfunction,MAC,mild MR,aortic sclerosis,mild TR    Allergies  Allergen Reactions  . Ondansetron Hcl Other (See Comments)    Other Reaction: High B/P  . Penicillins Anaphylaxis, Itching and Swelling    Has patient had a PCN reaction causing immediate rash, facial/tongue/throat swelling, SOB or lightheadedness with hypotension: Yes Has patient had a PCN reaction causing severe rash involving mucus membranes or skin necrosis: No Has patient had a PCN reaction that required hospitalization No Has patient had a PCN reaction occurring within the last 10 years: No If all of the above answers are "NO", then may proceed with Cephalosporin use.   . Aspirin     Has liver disease  . Midazolam Hcl Other (See Comments)    Daughter has allergy, doctors advised to warn against using.Marland KitchenMarland KitchenPropofol is okay to use.  . Niacin And Related Itching    Feels like skin is on fire      Medication List       Accurate as of 11/15/15  6:27 PM. Always use your most recent med list.          acetaminophen 325 MG tablet Commonly known as:  TYLENOL Take 2 tablets (650 mg total) by mouth every 6 (six) hours as needed for mild pain (or Fever >/= 101).   buPROPion 75 MG tablet Commonly known as:  WELLBUTRIN Take 75-150 mg by mouth 2 (two) times daily. 75mg  in the morning, 150mg  in the evening   clindamycin 300 MG capsule Commonly known as:  CLEOCIN Take 1 capsule (300 mg total) by mouth every 8 (eight) hours.   diphenhydrAMINE 25 mg capsule Commonly known as:  BENADRYL Take 1 capsule (25 mg total) by mouth every 6 (six) hours as needed for itching, allergies or sleep.   ergocalciferol 50000 units capsule Commonly known as:  VITAMIN D2 Take 1 capsule by mouth 2 (two) times a week.   furosemide 40 MG tablet Commonly known as:  LASIX Take by mouth daily. Take 1 (40mg ) tab in the morning and  half tab (20mg ) in the evening   gabapentin 300 MG capsule Commonly known as:  NEURONTIN Take 1 capsule (300 mg total) by mouth at bedtime.   lactulose 10 GM/15ML solution Commonly known as:  CHRONULAC Take 45 g by mouth daily as needed for mild constipation.   oxyCODONE 5 MG immediate release tablet Commonly known as:  Oxy IR/ROXICODONE Take 1 tablet (5 mg total) by mouth every 4 (four) hours as needed for severe pain.   potassium chloride SA 20 MEQ tablet Commonly known as:  K-DUR,KLOR-CON Take 1 tablet (20 mEq total) by mouth 2 (two)  times daily.   rifaximin 550 MG Tabs tablet Commonly known as:  XIFAXAN Take 1 tablet (550 mg total) by mouth 2 (two) times daily.   rOPINIRole 1 MG tablet Commonly known as:  REQUIP Take 2 mg by mouth at bedtime.   spironolactone 100 MG tablet Commonly known as:  ALDACTONE Take 100 mg by mouth daily.   traZODone 50 MG tablet Commonly known as:  DESYREL Take 50 mg by mouth at bedtime.       Review of Systems  DATA OBTAINED: from patient- as per HPI GENERAL:  no fevers, fatigue, appetite changes SKIN: RLE redness worse, hurts more , EUE pain is better HEENT: No complaint RESPIRATORY: No cough, wheezing, SOB CARDIAC: No chest pain, palpitations, lower extremity edema  GI: No abdominal pain, No N/V/D or constipation, No heartburn or reflux  GU: No dysuria, frequency or urgency, or incontinence  MUSCULOSKELETAL: No unrelieved bone/joint pain NEUROLOGIC: No headache, dizziness  PSYCHIATRIC: No overt anxiety or sadness   Immunization History  Administered Date(s) Administered  . PPD Test 11/13/2015   Pertinent  Health Maintenance Due  Topic Date Due  . DEXA SCAN  01/28/2013  . PNA vac Low Risk Adult (1 of 2 - PCV13) 01/28/2013  . MAMMOGRAM  03/11/2013  . INFLUENZA VACCINE  10/04/2015  . COLONOSCOPY  10/22/2023   No flowsheet data found.  Vitals:   11/15/15 1344  BP: (!) 148/68  Pulse: 78  Resp: 18  Temp: 98.2 F (36.8  C)  SpO2: 97%  Weight: 227 lb (103 kg)  Height: 5\' 8"  (1.727 m)   Body mass index is 34.52 kg/m.  Physical Exam  GENERAL APPEARANCE: Alert, conversant, No acute distress  SKIN: RUE - area of bruising and redness improved from yesterday; today I can see a healing puncture from her former PICC line probably; RLE redness is worse today with more heat HEENT: Unremarkable RESPIRATORY: Breathing is even, unlabored. Lung sounds are clear   CARDIOVASCULAR: Heart RRR no murmurs, rubs or gallops. 1+ peripheral edema  GASTROINTESTINAL: Abdomen is soft, non-tender, not distended w/ normal bowel sounds.  GENITOURINARY: Bladder non tender, not distended  MUSCULOSKELETAL: No abnormal joints or musculature NEUROLOGIC: Cranial nerves 2-12 grossly intact. Moves all extremities PSYCHIATRIC: Mood and affect appropriate to situation, no behavioral issues  Labs reviewed:  Recent Labs  11/08/15 0316 11/08/15 1715 11/09/15 0430 11/10/15 0345 11/11/15 11/11/15 0416  NA  --  138  138 138  138 138  138 139 139  K  --  2.8*  2.8* 3.3*  3.3* 3.4*  3.4  --  3.5  CL  --  111 111 109  --  110  CO2  --  24 24 26   --  25  GLUCOSE  --  122* 99 94  --  118*  BUN  --  32*  32* 33*  33* 30*  30* 24* 24*  CREATININE  --  0.95  1.0 0.88  0.9 0.87  0.9 0.9 0.91  CALCIUM  --  7.6* 8.1* 8.5*  --  8.6*  MG 1.4* 1.9 2.0  --   --   --     Recent Labs  11/09/15 0430 11/10/15 0345 11/11/15 0416  AST 104* 89*  89* 64*  ALT 43  43* 45  45* 42  ALKPHOS 107  107 145*  145* 139*  BILITOT 4.1* 3.8* 3.7*  PROT 4.8* 4.9* 4.8*  ALBUMIN 2.2* 2.2* 2.1*    Recent Labs  11/07/15 1910 11/08/15  0215 11/09/15 0430 11/10/15 0345 11/11/15 11/11/15 0416  WBC 7.9  7.9 7.5  7.5 7.5  7.5 5.9  5.9 4.5 4.5  NEUTROABS 7.2 6.5 5.7  --   --   --   HGB 12.8  12.8 11.3*  11.3* 10.6*  10.6* 10.8*  10.8*  --  10.8*  HCT 36.7  37 32.5*  33* 30.9*  31* 31.7*  32*  --  31.5*  MCV 97.1 98.2 97.8 97.8  --   100.3*  PLT 34*  34* 34*  34* 27*  27* 36*  36*  --  34*   No results for input(s): CHOL, LDLCALC, TRIG in the last 8760 hours.  Invalid input(s): HCL No results found for: MICROALBUR Lab Results  Component Value Date   TSH 2.353 11/08/2015   No results found for: HGBA1C No results found for: CHOL, HDL, LDLCALC, LDLDIRECT, TRIG, CHOLHDL  Significant Diagnostic Results in last 30 days:  Dg Chest 2 View  Result Date: 11/07/2015 CLINICAL DATA:  70 year old female with weakness, fever, and headache EXAM: CHEST  2 VIEW COMPARISON:  Chest radiograph dated 06/14/2013 FINDINGS: Two views of the chest do not demonstrate a focal consolidation. There is no pleural effusion or pneumothorax. Stable normal cardiac silhouette. No acute osseous pathology identified. Partially visualized left proximal humeral fixation plate and screws. Multiple surgical clips noted in the left breast area. IMPRESSION: No active cardiopulmonary disease. Electronically Signed   By: Anner Crete M.D.   On: 11/07/2015 19:30   Ct Head Wo Contrast  Result Date: 11/07/2015 CLINICAL DATA:  Acute onset of generalized weakness and fever. Altered mental status. Initial encounter. EXAM: CT HEAD WITHOUT CONTRAST TECHNIQUE: Contiguous axial images were obtained from the base of the skull through the vertex without intravenous contrast. COMPARISON:  None. FINDINGS: Brain: No evidence of acute infarction, hemorrhage, hydrocephalus, extra-axial collection or mass lesion/mass effect. The posterior fossa, including the cerebellum, brainstem and fourth ventricle, is within normal limits. The third and lateral ventricles, and basal ganglia are unremarkable in appearance. The cerebral hemispheres are symmetric in appearance, with normal gray-white differentiation. No mass effect or midline shift is seen. Vascular: No hyperdense vessel or unexpected calcification. Skull: There is no evidence of fracture; visualized osseous structures are  unremarkable in appearance. Sinuses/Orbits: The visualized portions of the orbits are within normal limits. The paranasal sinuses and mastoid air cells are well-aerated. Other: No significant soft tissue abnormalities are seen. IMPRESSION: Unremarkable noncontrast CT of the head. Electronically Signed   By: Garald Balding M.D.   On: 11/07/2015 23:29   US Abdomen Complete  Result Date: 11/08/2015 CLINICAL DATA:  Evaluate for portal vein thrombosis. Elevated LFTs. Initial encounter. EXAM: EXAM ULTRASOUND ABDOMEN COMPLETE DUPLEX ULTRASOUND OF LIVER TECHNIQUE: Complete abdominal ultrasound examination was performed. Color and duplex Doppler ultrasound was also performed to evaluate the hepatic in-flow and out-flow vessels. COMPARISON:  CT of the abdomen and pelvis performed 10/01/2013, and abdominal ultrasound performed 04/09/2014 FINDINGS: Portal Vein Velocities Main: 31.2 cm/sec Right: 15.7  cm/sec Left: 13.3 cm/sec Hepatic Vein Velocities Right: 25.5 cm/sec Middle: 30.7 cm/sec Left: 22.6 cm/sec Hepatic artery velocity: 75.2 cm/sec Splenic vein velocity: 29.0 cm/sec Varices:  Known mild varices are difficult to fully characterize. Ascites: None seen. Vascular structures: There appears to be a left-sided recanalized umbilical vein. Normal hepatopetal flow is seen within the portal venous system, while normal triphasic antegrade flow is noted within the hepatic veins. There is no evidence of thrombosis. Gallbladder: Status post cholecystectomy.  No  retained stones seen. Common bile duct: Diameter: 1.3 cm, likely within normal limits status post cholecystectomy. Liver: No focal lesion identified. Coarsened echotexture likely reflects fatty infiltration. The nodular contour raises concern for mild hepatic cirrhosis. IVC: No abnormality visualized. Pancreas: Not visualized due to overlying bowel gas. Spleen: Enlarged, measuring 18.4 cm in length. Right Kidney: Length: 11.2 cm. Echogenicity within normal limits. No mass  or hydronephrosis visualized. Left Kidney: Length: 11.6 cm. Echogenicity within normal limits. No mass or hydronephrosis visualized. Abdominal aorta: No aneurysm visualized. Not characterized distally due to overlying bowel gas. Other Findings: None. IMPRESSION: 1. No evidence of portal vein thrombosis. 2. Changes of mild hepatic cirrhosis, with recanalized left-sided umbilical vein. 3. Normal hepatopetal flow within the portal venous system, and normal triphasic vascular flow within the hepatic veins. 4. Splenomegaly noted. 5. Known mild varices are difficult to fully characterize. Electronically Signed   By: Garald Balding M.D.   On: 11/08/2015 02:27   Dg Pelvis Portable  Result Date: 11/07/2015 CLINICAL DATA:  Status post fall, with concern for pelvic injury. Initial encounter. EXAM: PORTABLE PELVIS 1-2 VIEWS COMPARISON:  CT of the abdomen and pelvis performed 06/03/2013 FINDINGS: There is no evidence of fracture or dislocation. Both femoral heads are seated normally within their respective acetabula. No significant degenerative change is appreciated. The sacroiliac joints are unremarkable in appearance. The visualized bowel gas pattern is grossly unremarkable in appearance. Clips are seen overlying the left inguinal region. IMPRESSION: No evidence of fracture or dislocation. Electronically Signed   By: Garald Balding M.D.   On: 11/07/2015 23:21   [place] Art/ven Flow Abd Pelv Doppler  Result Date: 11/08/2015 CLINICAL DATA:  Evaluate for portal vein thrombosis. Elevated LFTs. Initial encounter. EXAM: EXAM ULTRASOUND ABDOMEN COMPLETE DUPLEX ULTRASOUND OF LIVER TECHNIQUE: Complete abdominal ultrasound examination was performed. Color and duplex Doppler ultrasound was also performed to evaluate the hepatic in-flow and out-flow vessels. COMPARISON:  CT of the abdomen and pelvis performed 10/01/2013, and abdominal ultrasound performed 04/09/2014 FINDINGS: Portal Vein Velocities Main: 31.2 cm/sec Right: 15.7  cm/sec  Left: 13.3 cm/sec Hepatic Vein Velocities Right: 25.5 cm/sec Middle: 30.7 cm/sec Left: 22.6 cm/sec Hepatic artery velocity: 75.2 cm/sec Splenic vein velocity: 29.0 cm/sec Varices:  Known mild varices are difficult to fully characterize. Ascites: None seen. Vascular structures: There appears to be a left-sided recanalized umbilical vein. Normal hepatopetal flow is seen within the portal venous system, while normal triphasic antegrade flow is noted within the hepatic veins. There is no evidence of thrombosis. Gallbladder: Status post cholecystectomy.  No retained stones seen. Common bile duct: Diameter: 1.3 cm, likely within normal limits status post cholecystectomy. Liver: No focal lesion identified. Coarsened echotexture likely reflects fatty infiltration. The nodular contour raises concern for mild hepatic cirrhosis. IVC: No abnormality visualized. Pancreas: Not visualized due to overlying bowel gas. Spleen: Enlarged, measuring 18.4 cm in length. Right Kidney: Length: 11.2 cm. Echogenicity within normal limits. No mass or hydronephrosis visualized. Left Kidney: Length: 11.6 cm. Echogenicity within normal limits. No mass or hydronephrosis visualized. Abdominal aorta: No aneurysm visualized. Not characterized distally due to overlying bowel gas. Other Findings: None. IMPRESSION: 1. No evidence of portal vein thrombosis. 2. Changes of mild hepatic cirrhosis, with recanalized left-sided umbilical vein. 3. Normal hepatopetal flow within the portal venous system, and normal triphasic vascular flow within the hepatic veins. 4. Splenomegaly noted. 5. Known mild varices are difficult to fully characterize. Electronically Signed   By: Garald Balding M.D.   On: 11/08/2015 02:27  Ir Fluoro Guide Cv Line Right  Result Date: 11/08/2015 CLINICAL DATA:  Sepsis, needs durable venous access. EXAM: PICC PLACEMENT WITH ULTRASOUND AND FLUOROSCOPY FLUOROSCOPY TIME:  6 seconds, 1 mGy TECHNIQUE: After written informed consent was  obtained, patient was placed in the supine position on angiographic table. Patency of the right basilic vein was confirmed with ultrasound with image documentation. An appropriate skin site was determined. Skin site was marked. Region was prepped using maximum barrier technique including cap and mask, sterile gown, sterile gloves, large sterile sheet, and Chlorhexidine as cutaneous antisepsis. The region was infiltrated locally with 1% lidocaine. Under real-time ultrasound guidance, the right basilic vein was accessed with a 21 gauge micropuncture needle; the needle tip within the vein was confirmed with ultrasound image documentation. Needle exchanged over a 018 guidewire for a peel-away sheath, through which a 5-French double-lumen power injectable PICC trimmed to 38cm was advanced, positioned with its tip near the cavoatrial junction. Spot chest radiograph confirms appropriate catheter position. Catheter was flushed per protocol and secured externally. The patient tolerated procedure well. COMPLICATIONS: COMPLICATIONS none IMPRESSION: 1. Technically successful five [place] double lumen power injectable PICC placement Electronically Signed   By: Lucrezia Europe M.D.   On: 11/08/2015 16:33   Ir US Guide Vasc Access Right  Result Date: 11/08/2015 CLINICAL DATA:  Sepsis, needs durable venous access. EXAM: PICC PLACEMENT WITH ULTRASOUND AND FLUOROSCOPY FLUOROSCOPY TIME:  6 seconds, 1 mGy TECHNIQUE: After written informed consent was obtained, patient was placed in the supine position on angiographic table. Patency of the right basilic vein was confirmed with ultrasound with image documentation. An appropriate skin site was determined. Skin site was marked. Region was prepped using maximum barrier technique including cap and mask, sterile gown, sterile gloves, large sterile sheet, and Chlorhexidine as cutaneous antisepsis. The region was infiltrated locally with 1% lidocaine. Under real-time ultrasound guidance, the right  basilic vein was accessed with a 21 gauge micropuncture needle; the needle tip within the vein was confirmed with ultrasound image documentation. Needle exchanged over a 018 guidewire for a peel-away sheath, through which a 5-French double-lumen power injectable PICC trimmed to 38cm was advanced, positioned with its tip near the cavoatrial junction. Spot chest radiograph confirms appropriate catheter position. Catheter was flushed per protocol and secured externally. The patient tolerated procedure well. COMPLICATIONS: COMPLICATIONS none IMPRESSION: 1. Technically successful five [place] double lumen power injectable PICC placement Electronically Signed   By: Lucrezia Europe M.D.   On: 11/08/2015 16:33    Assessment/Plan  RUE CELLULITIS/ RUE SWELLING - cellulitis is better, swelling is worse; have ordered U/s RUE for swelling in site of former PICC line; I spoke with Dr hatcher by phone to consult best antibiotic since pt developed UE cellulitis while on clindamycin ( prior had been on Vanc which I understand, levaquin which I don't understand; He suggested doxy or bactrim; since she has thrombocytopenia chose docycycline 100 mg BID for 14 days  RLE CELLULITIS - as per above, doxycycline 100 g BID for 14 days    Labs/tests ordered: RUE U/S, CBC in 7 days  Time spent > 35 min;> 50% of time with patient was spent reviewing records, labs, tests and studies, counseling and developing plan of care  Webb Silversmith D. Sheppard Coil, MD

## 2015-11-22 LAB — BASIC METABOLIC PANEL
BUN: 20 mg/dL (ref 4–21)
Creatinine: 1.1 mg/dL (ref 0.5–1.1)
GLUCOSE: 85 mg/dL
Potassium: 4.2 mmol/L (ref 3.4–5.3)
SODIUM: 135 mmol/L — AB (ref 137–147)

## 2015-11-22 LAB — CBC AND DIFFERENTIAL
HEMATOCRIT: 31 % — AB (ref 36–46)
HEMOGLOBIN: 10.5 g/dL — AB (ref 12.0–16.0)
PLATELETS: 35 10*3/uL — AB (ref 150–399)
WBC: 3.3 10^3/mL

## 2015-11-24 ENCOUNTER — Telehealth: Payer: Self-pay | Admitting: Cardiology

## 2015-11-24 NOTE — Telephone Encounter (Signed)
LMOVM requesting that pt send manual transmission b/c home monitor has not updated in at least 14 days.    

## 2015-11-25 ENCOUNTER — Encounter: Payer: Self-pay | Admitting: Internal Medicine

## 2015-11-25 ENCOUNTER — Non-Acute Institutional Stay (SKILLED_NURSING_FACILITY): Payer: Medicare Other | Admitting: Internal Medicine

## 2015-11-25 DIAGNOSIS — R601 Generalized edema: Secondary | ICD-10-CM

## 2015-11-25 DIAGNOSIS — D696 Thrombocytopenia, unspecified: Secondary | ICD-10-CM

## 2015-11-25 DIAGNOSIS — K7460 Unspecified cirrhosis of liver: Secondary | ICD-10-CM

## 2015-11-25 NOTE — Progress Notes (Signed)
MRN: [id number] Name: [name]  Sex: female Age: 70 y.o. DOB: [birth year]  [place] #:  Facility/Room: [facility] / [id number] P Level Of Care: SNF Provider: Noah Delaine. Sheppard Coil, MD Emergency Contacts: Extended Emergency Contact Information Primary Emergency Contact: Hess,Jennifer Address: [place], [place] [address] [place] Phone: [phone] Work Phone: [phone] Mobile Phone: [phone] Relation: Daughter Secondary Emergency Contact: Thurmond Butts, [place] [address] [place] Mobile Phone: [phone] Relation: Son  Code Status: Full Code  Allergies: Ondansetron hcl; Penicillins; Aspirin; Midazolam hcl; and Niacin and related  Chief Complaint  Patient presents with  . Acute Visit    Acute    HPI: Patient is 70 y.o. female who the dietician asked me to see because she has had a weight loss of 235-203 lbs in the past 10 days.Pt is eating 87% of her meals. We need to know the cause of her weight loss.Pt does not know her dry weight but says if she got down to 210 lbs in the past she felt she was lucky. I am also following up on pt's PLT count.  Past Medical History:  Diagnosis Date  . Anxiety   . Asthma   . Blood dyscrasia    low platelets ... low wbc.  ? liver  disease.   . Cancer [facility]) 2005   left breast  . Chronic kidney disease 50's   nephritis--none at present *12/03/2011)  . Cirrhosis ([place]) 2.5 yrs   never a drinker.  platelets  low... wbc also low per pt ..   . Complication of anesthesia    had difficulty going to sleep, staying asleep during surgery and was combative after procedure.  . Depression   . Diabetes mellitus    no meds  . Family history of anesthesia complication    Daughter has complications with Versed  . H/O hiatal hernia   . Headache(784.0)   . Heart murmur   . Hypertension   . Platelets decreased ([place])    ??   D/T CIRRHOISIS  . Portal hypertension ([place])   . PSVT (paroxysmal  supraventricular tachycardia) ([place])   . Restless leg   . Shortness of breath    with exertion   . Sleep apnea 2013   sleep study at [facility]    uses cpap    Past Surgical History:  Procedure Laterality Date  . ABDOMINAL HYSTERECTOMY    . APPENDECTOMY    . BREAST SURGERY     left lumpectomy--few nodes removed  . CARDIAC CATHETERIZATION  1975   normal cath  -low potassium  . CHOLECYSTECTOMY    . COLONOSCOPY W/ BIOPSIES AND POLYPECTOMY    . COLONOSCOPY WITH PROPOFOL N/A 10/21/2013   Procedure: COLONOSCOPY WITH PROPOFOL;  Surgeon: [name], MD;  Location: [facility] ENDOSCOPY;  Service: Endoscopy;  Laterality: N/A;  ultra slim scope   . EP IMPLANTABLE DEVICE N/A 04/05/2015   Procedure: Loop Recorder Insertion;  Surgeon: Sanda Klein, MD;  Location: [facility] CV LAB;  Service: Cardiovascular;  Laterality: N/A;  . ESOPHAGOGASTRODUODENOSCOPY (EGD) WITH PROPOFOL N/A 10/21/2013   Procedure: ESOPHAGOGASTRODUODENOSCOPY (EGD) WITH PROPOFOL;  Surgeon: [name], MD;  Location: [facility] ENDOSCOPY;  Service: Endoscopy;  Laterality: N/A;  . IR GENERIC HISTORICAL  11/08/2015   IR FLUORO GUIDE CV LINE RIGHT 11/08/2015 [facility]-INTERV RAD  . IR GENERIC HISTORICAL  11/08/2015   IR US GUIDE  VASC ACCESS RIGHT 11/08/2015 [facility]INTER[facility] RAD  . MASTECTOMY    . NM MYOCAR PERF WALL MOTION  08/17/2008   mild to mod. superimposed ischemiamid antereoseptal,apical anterior & apical regions  . ORIF HUMERUS FRACTURE  06/19/2011   Procedure: OPEN REDUCTION INTERNAL FIXATION (ORIF) DISTAL HUMERUS FRACTURE;  Surgeon: [name], MD;  Location: [facility];  Service: Orthopedics;  Laterality: Left;  . ORIF HUMERUS FRACTURE  12/11/2011   Procedure: OPEN REDUCTION INTERNAL FIXATION (ORIF) HUMERAL SHAFT FRACTURE;  Surgeon: [name], MD;  Location: [facility];  Service: Orthopedics;  Laterality: Left;   Removal of Hardware left humerus, Open Reduction Internal Fixation Left Humeral Shaft Fracture Non-Union  . TONSILLECTOMY    . US  ECHOCARDIOGRAPHY  03/16/2011   mild concentric LVH,mod LAE,stage I diastolic dysfunction,MAC,mild MR,aortic sclerosis,mild TR      Medication List       Accurate as of 11/25/15  2:38 PM. Always use your most recent med list.          acetaminophen 325 MG tablet Commonly known as:  TYLENOL Take 325 mg by mouth every 6 (six) hours as needed for mild pain.   antiseptic oral rinse Liqd 15 mLs by Mouth Rinse route 2 (two) times daily as needed for dry mouth.   buPROPion 75 MG tablet Commonly known as:  WELLBUTRIN Take by mouth. Take 1 tablet (75mg ) in the morning, then take 2 tablets (150mg ) at bedtime   diphenhydrAMINE 25 mg capsule Commonly known as:  BENADRYL Take 1 capsule (25 mg total) by mouth every 6 (six) hours as needed for itching, allergies or sleep.   doxycycline 100 MG capsule Commonly known as:  VIBRAMYCIN Take 100 mg by mouth 2 (two) times daily. Stop date 11/29/15   feeding supplement (PRO-STAT SUGAR FREE 64) Liqd Take 30 mLs by mouth 2 (two) times daily.   furosemide 40 MG tablet Commonly known as:  LASIX Take by mouth daily. Take 1 (40mg ) tab in the morning and half tab (20mg ) in the evening   gabapentin 300 MG capsule Commonly known as:  NEURONTIN Take 1 capsule (300 mg total) by mouth at bedtime.   lactulose 10 GM/15ML solution Commonly known as:  CHRONULAC Take 45 g by mouth daily as needed for mild constipation.   oxyCODONE 5 MG immediate release tablet Commonly known as:  Oxy IR/ROXICODONE Take 1 tablet (5 mg total) by mouth every 4 (four) hours as needed for severe pain.   potassium chloride SA 20 MEQ tablet Commonly known as:  K-DUR,KLOR-CON Take 1 tablet (20 mEq total) by mouth 2 (two) times daily.   promethazine 25 MG tablet Commonly known as:  PHENERGAN Take 25 mg by mouth every 6 (six) hours as needed for nausea or vomiting.   rifaximin 550 MG Tabs tablet Commonly known as:  XIFAXAN Take 1 tablet (550 mg total) by mouth 2 (two) times  daily.   rOPINIRole 1 MG tablet Commonly known as:  REQUIP Take 2 mg by mouth at bedtime.   spironolactone 100 MG tablet Commonly known as:  ALDACTONE Take 100 mg by mouth daily.   traZODone 50 MG tablet Commonly known as:  DESYREL Take 50 mg by mouth at bedtime.   Vitamin D3 5000 units Tabs Take 1 tablet by mouth 2 (two) times a week.       Meds ordered this encounter  Medications  . antiseptic oral rinse (BIOTENE) LIQD    Sig: 15 mLs by Mouth Rinse route 2 (two) times  daily as needed for dry mouth.  . Amino Acids-Protein Hydrolys (FEEDING SUPPLEMENT, PRO-STAT SUGAR FREE 64,) LIQD    Sig: Take 30 mLs by mouth 2 (two) times daily.  [name] acetaminophen (TYLENOL) 325 MG tablet    Sig: Take 325 mg by mouth every 6 (six) hours as needed for mild pain.  . promethazine (PHENERGAN) 25 MG tablet    Sig: Take 25 mg by mouth every 6 (six) hours as needed for nausea or vomiting.  . Cholecalciferol (VITAMIN D3) 5000 units TABS    Sig: Take 1 tablet by mouth 2 (two) times a week.  . doxycycline (VIBRAMYCIN) 100 MG capsule    Sig: Take 100 mg by mouth 2 (two) times daily. Stop date 11/29/15    Immunization History  Administered Date(s) Administered  . PPD Test 11/13/2015    Social History  Substance Use Topics  . Smoking status: Never Smoker  . Smokeless tobacco: Never Used  . Alcohol use No    Review of Systems  DATA OBTAINED: from patient, dietician GENERAL:  no fevers, fatigue, appetite changes SKIN: redness RLE is better but not gone HEENT: No complaint RESPIRATORY: No cough, wheezing, SOB CARDIAC: No chest pain, palpitations, lower extremity edema  GI: No abdominal pain, No N/V/D or constipation, No heartburn or reflux  GU: No dysuria, frequency or urgency, or incontinence  MUSCULOSKELETAL: No unrelieved bone/joint pain NEUROLOGIC: No headache, dizziness  PSYCHIATRIC: No overt anxiety or sadness  Vitals:   11/25/15 1413  BP: (!) 110/53  Pulse: 72  Resp: 18  Temp:  98.8 F (37.1 C)    Physical Exam  GENERAL APPEARANCE: Alert, conversant, No acute distress; pt looks very good compared to prior  SKIN: RLE redness is present, less red, minimal heal minimal TTP; ROE redness has resolved well; various bruises from IV starts are resolving HEENT: Unremarkable RESPIRATORY: Breathing is even, unlabored. Lung sounds are clear   CARDIOVASCULAR: Heart RRR no murmurs, rubs or gallops. 1/2 - 1 + peripheral edema  GASTROINTESTINAL: Abdomen is soft, non-tender, not distended w/ normal bowel sounds, no fluid  GENITOURINARY: Bladder non tender, not distended  MUSCULOSKELETAL: No abnormal joints or musculature NEUROLOGIC: Cranial nerves 2-12 grossly intact. Moves all extremities PSYCHIATRIC: Mood and affect appropriate to situation, no behavioral issues  Patient Active Problem List   Diagnosis Date Noted  . Hepatic encephalopathy ([place]) 11/15/2015  . Coagulopathy ([place]) 11/15/2015  . Other hypervolemia 11/15/2015  . Hypokalemia 11/15/2015  . Rhabdomyolysis 11/15/2015  . RLS (restless legs syndrome) 11/15/2015  . Vitamin D deficiency 11/15/2015  . Cellulitis of right upper extremity 11/15/2015  . Sepsis ([place]) 11/07/2015  . Cellulitis of right lower extremity 11/07/2015  . Elevated LFTs   . Accelerated junctional rhythm 07/12/2015  . Encounter for loop recorder check 07/11/2015  . Syncope 03/28/2015  . Generalized weakness 07/09/2013  . Fall at home 07/09/2013  . S/P tooth extraction 07/09/2013  . Weakness 07/09/2013  . OSA (obstructive sleep apnea) 11/01/2012  . MVP (mitral valve prolapse) 11/01/2012  . PSVT (paroxysmal supraventricular tachycardia) ([place]) 11/01/2012  . Cirrhosis, non-alcoholic ([place]) [id number]  . HTN (hypertension) 11/01/2012  . Thrombocytopenia, unspecified ([place]) 12/14/2011  . Acute blood loss anemia 12/14/2011  . 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 12/14/2011    CBC    Component Value Date/Time   WBC 3.3 11/22/2015   WBC  4.5 11/11/2015 0416   RBC 3.14 (L) 11/11/2015 0416   HGB 10.5 (A) 11/22/2015   HGB 13.1 02/17/2010 1128  HCT 31 (A) 11/22/2015   HCT 38.3 02/17/2010 1128   PLT 35 (A) 11/22/2015   PLT 74 (L) 02/17/2010 1128   MCV 100.3 (H) 11/11/2015 0416   MCV 90.1 02/17/2010 1128   LYMPHSABS 1.0 11/09/2015 0430   LYMPHSABS 0.9 02/17/2010 1128   MONOABS 0.8 11/09/2015 0430   MONOABS 0.2 02/17/2010 1128   EOSABS 0.0 11/09/2015 0430   EOSABS 0.0 02/17/2010 1128   BASOSABS 0.0 11/09/2015 0430   BASOSABS 0.0 02/17/2010 1128    CMP     Component Value Date/Time   NA 135 (A) 11/22/2015   K 4.2 11/22/2015   CL 110 11/11/2015 0416   CO2 25 11/11/2015 0416   GLUCOSE 118 (H) 11/11/2015 0416   BUN 20 11/22/2015   CREATININE 1.1 11/22/2015   CREATININE 0.91 11/11/2015 0416   CREATININE 0.66 01/07/2013 1427   CALCIUM 8.6 (L) 11/11/2015 0416   PROT 4.8 (L) 11/11/2015 0416   ALBUMIN 2.1 (L) 11/11/2015 0416   AST 64 (H) 11/11/2015 0416   ALT 42 11/11/2015 0416   ALKPHOS 139 (H) 11/11/2015 0416   BILITOT 3.7 (H) 11/11/2015 0416   GFRNONAA >60 11/11/2015 0416   GFRAA >60 11/11/2015 0416    Assessment and Plan  ANASARCA/NON ALCOHOLIC CIRRHOSIS - when [pt was d/c from hospital d/c summary noted she still had a lot of diuresing left to do and I believe all the wight loss is fluid. Her arms look better, LE edema is much improved, abd without swelling. She probably has another 5-10 pounds to lose to get her to her best dry weight. BUN/CR 20/1.1,K+ 4.2; all excellent, will cont to monitor.  THROMBOCYTOPENIA - PLT are 35 which is stable;will monitor at intervals and prn    Time spent > 35 min;> 50% of time with patient was spent reviewing records, labs, tests and studies, counseling and developing plan of care  Webb Silversmith D. Sheppard Coil, MD

## 2015-11-26 ENCOUNTER — Encounter: Payer: Self-pay | Admitting: Internal Medicine

## 2015-11-26 DIAGNOSIS — R601 Generalized edema: Secondary | ICD-10-CM | POA: Insufficient documentation

## 2015-11-26 LAB — CUP PACEART REMOTE DEVICE CHECK: Date Time Interrogation Session: 20170829210738

## 2015-11-26 NOTE — Progress Notes (Signed)
Carelink summary report received. Battery status OK. Normal device function. No new symptom episodes, tachy episodes, brady, or pause episodes. No new AF episodes. Monthly summary reports and ROV/PRN 

## 2015-12-01 ENCOUNTER — Ambulatory Visit (INDEPENDENT_AMBULATORY_CARE_PROVIDER_SITE_OTHER): Payer: Medicare Other | Admitting: *Deleted

## 2015-12-01 ENCOUNTER — Non-Acute Institutional Stay (SKILLED_NURSING_FACILITY): Payer: Medicare Other | Admitting: Internal Medicine

## 2015-12-01 ENCOUNTER — Encounter: Payer: Self-pay | Admitting: Internal Medicine

## 2015-12-01 DIAGNOSIS — M6282 Rhabdomyolysis: Secondary | ICD-10-CM | POA: Diagnosis not present

## 2015-12-01 DIAGNOSIS — G2581 Restless legs syndrome: Secondary | ICD-10-CM

## 2015-12-01 DIAGNOSIS — D689 Coagulation defect, unspecified: Secondary | ICD-10-CM | POA: Diagnosis not present

## 2015-12-01 DIAGNOSIS — K7290 Hepatic failure, unspecified without coma: Secondary | ICD-10-CM | POA: Diagnosis not present

## 2015-12-01 DIAGNOSIS — K7682 Hepatic encephalopathy: Secondary | ICD-10-CM

## 2015-12-01 DIAGNOSIS — D696 Thrombocytopenia, unspecified: Secondary | ICD-10-CM | POA: Diagnosis not present

## 2015-12-01 DIAGNOSIS — E876 Hypokalemia: Secondary | ICD-10-CM

## 2015-12-01 DIAGNOSIS — K7460 Unspecified cirrhosis of liver: Secondary | ICD-10-CM

## 2015-12-01 DIAGNOSIS — R55 Syncope and collapse: Secondary | ICD-10-CM | POA: Diagnosis not present

## 2015-12-01 DIAGNOSIS — I10 Essential (primary) hypertension: Secondary | ICD-10-CM

## 2015-12-01 DIAGNOSIS — A419 Sepsis, unspecified organism: Secondary | ICD-10-CM | POA: Diagnosis not present

## 2015-12-01 DIAGNOSIS — L03115 Cellulitis of right lower limb: Secondary | ICD-10-CM | POA: Diagnosis not present

## 2015-12-01 DIAGNOSIS — E8779 Other fluid overload: Secondary | ICD-10-CM | POA: Diagnosis not present

## 2015-12-01 DIAGNOSIS — L03113 Cellulitis of right upper limb: Secondary | ICD-10-CM | POA: Diagnosis not present

## 2015-12-01 DIAGNOSIS — E559 Vitamin D deficiency, unspecified: Secondary | ICD-10-CM | POA: Diagnosis not present

## 2015-12-01 NOTE — Progress Notes (Signed)
Location:  [place] Room Number: [place] of Service:  SNF (31)  Noah Delaine. Sheppard Coil, MD  PCP: Sheela Stack, MD Patient Care Team: Reynold Bowen, MD as PCP - General (Endocrinology)  Extended Emergency Contact Information Primary Emergency Contact: Hess,Jennifer Address: [address]          [place], [place] [address] Johnnette Litter of [place] Phone: [phone] Work Phone: [phone] Mobile Phone: [phone] Relation: Daughter Secondary Emergency Contact: Thurmond Butts, [place] [address] [place] of [employer] Phone: [phone] Relation: Son  Allergies  Allergen Reactions  . Ondansetron Hcl Other (See Comments)    Other Reaction: High B/P  . Penicillins Anaphylaxis, Itching and Swelling    Has patient had a PCN reaction causing immediate rash, facial/tongue/throat swelling, SOB or lightheadedness with hypotension: Yes Has patient had a PCN reaction causing severe rash involving mucus membranes or skin necrosis: No Has patient had a PCN reaction that required hospitalization No Has patient had a PCN reaction occurring within the last 10 years: No If all of the above answers are "NO", then may proceed with Cephalosporin use.   . Aspirin     Has liver disease  . Midazolam Hcl Other (See Comments)    Daughter has allergy, doctors advised to warn against using.Marland KitchenMarland KitchenPropofol is okay to use.  . Niacin And Related Itching    Feels like skin is on fire    Chief Complaint  Patient presents with  . Discharge Note    Discharged from SNF    HPI:  70 y.o. female with nonalcoholic liver cirrhosis, history of PSVT, chronic thrombocytopenia was brought to the ER after patient was found to be getting weak over the last few days.Pt had no specific c/o but was noted to have RLE redness,  c/w celllulitis. Pt was admitted to [facility] from 9/4-10 where she was treated for cellulitis with IV vanc and levaquin then transitioned to po  clindamycin. Pt 's hospital course was complicated by hypervolemia,  thrombocytopenia and coagulopathy. Pt was admitted to SNF with profound weakness, for OT/PT and is now ready to be d/c to home. Of note weight yesterday 198 lbs, she came in at  220 lbs, her best dry weight is probably 190-195.    Past Medical History:  Diagnosis Date  . Anxiety   . Asthma   . Blood dyscrasia    low platelets ... low wbc.  ? liver  disease.   . Cancer [facility]) 2005   left breast  . Chronic kidney disease 50's   nephritis--none at present *12/03/2011)  . Cirrhosis ([place]) 2.5 yrs   never a drinker.  platelets  low... wbc also low per pt ..   . Complication of anesthesia    had difficulty going to sleep, staying asleep during surgery and was combative after procedure.  . Depression   . Diabetes mellitus    no meds  . Family history of anesthesia complication    Daughter has complications with Versed  . H/O hiatal hernia   . Headache(784.0)   . Heart murmur   . Hypertension   . Platelets decreased ([place])    ??   D/T CIRRHOISIS  . Portal hypertension ([place])   . PSVT (paroxysmal supraventricular tachycardia) ([facility])   . Restless leg   . Shortness of breath    with exertion   . Sleep apnea 2013   sleep study at [facility]  uses cpap    Past Surgical History:  Procedure Laterality Date  . ABDOMINAL HYSTERECTOMY    . APPENDECTOMY    . BREAST SURGERY     left lumpectomy--few nodes removed  . CARDIAC CATHETERIZATION  1975   normal cath  -low potassium  . CHOLECYSTECTOMY    . COLONOSCOPY W/ BIOPSIES AND POLYPECTOMY    . COLONOSCOPY WITH PROPOFOL N/A 10/21/2013   Procedure: COLONOSCOPY WITH PROPOFOL;  Surgeon: [name], MD;  Location: [facility] ENDOSCOPY;  Service: Endoscopy;  Laterality: N/A;  ultra slim scope   . EP IMPLANTABLE DEVICE N/A 04/05/2015   Procedure: Loop Recorder Insertion;  Surgeon: Sanda Klein, MD;  Location: [place] CV LAB;  Service: Cardiovascular;  Laterality: N/A;  .  ESOPHAGOGASTRODUODENOSCOPY (EGD) WITH PROPOFOL N/A 10/21/2013   Procedure: ESOPHAGOGASTRODUODENOSCOPY (EGD) WITH PROPOFOL;  Surgeon: [name], MD;  Location: [facility] ENDOSCOPY;  Service: Endoscopy;  Laterality: N/A;  . IR GENERIC HISTORICAL  11/08/2015   IR FLUORO GUIDE CV LINE RIGHT 11/08/2015 [facility]-INTERV RAD  . IR GENERIC HISTORICAL  11/08/2015   IR US GUIDE VASC ACCESS RIGHT 11/08/2015 [facility]-INTERV RAD  . MASTECTOMY    . NM MYOCAR PERF WALL MOTION  08/17/2008   mild to mod. superimposed ischemiamid antereoseptal,apical anterior & apical regions  . ORIF HUMERUS FRACTURE  06/19/2011   Procedure: OPEN REDUCTION INTERNAL FIXATION (ORIF) DISTAL HUMERUS FRACTURE;  Surgeon: [name], MD;  Location: [place];  Service: Orthopedics;  Laterality: Left;  . ORIF HUMERUS FRACTURE  12/11/2011   Procedure: OPEN REDUCTION INTERNAL FIXATION (ORIF) HUMERAL SHAFT FRACTURE;  Surgeon: [name], MD;  Location: [place];  Service: Orthopedics;  Laterality: Left;   Removal of Hardware left humerus, Open Reduction Internal Fixation Left Humeral Shaft Fracture Non-Union  . TONSILLECTOMY    . US ECHOCARDIOGRAPHY  03/16/2011   mild concentric LVH,mod LAE,stage I diastolic dysfunction,MAC,mild MR,aortic sclerosis,mild TR     reports that she has never smoked. She has never used smokeless tobacco. She reports that she does not drink alcohol or use drugs. Social History   Social History  . Marital status: Widowed    Spouse name: N/A  . Number of children: N/A  . Years of education: N/A   Occupational History  . Not on file.   Social History Main Topics  . Smoking status: Never Smoker  . Smokeless tobacco: Never Used  . Alcohol use No  . Drug use: No  . Sexual activity: No   Other Topics Concern  . Not on file   Social History Narrative  . No narrative on file    Pertinent  Health Maintenance Due  Topic Date Due  . DEXA SCAN  01/28/2013  . PNA vac Low Risk Adult (1 of 2 - PCV13) 01/28/2013  .  MAMMOGRAM  03/11/2013  . INFLUENZA VACCINE  10/04/2015  . COLONOSCOPY  10/22/2023    Medications:   Medication List       Accurate as of 12/01/15  2:32 PM. Always use your most recent med list.          acetaminophen 325 MG tablet Commonly known as:  TYLENOL Take 325 mg by mouth every 6 (six) hours as needed for mild pain.   antiseptic oral rinse Liqd 15 mLs by Mouth Rinse route 2 (two) times daily as needed for dry mouth.   buPROPion 75 MG tablet Commonly known as:  WELLBUTRIN Take 1 tablet (75mg ) in the morning, then take 2 tablets (150mg ) at bedtime  diphenhydrAMINE 25 mg capsule Commonly known as:  BENADRYL Take 1 capsule (25 mg total) by mouth every 6 (six) hours as needed for itching, allergies or sleep.   feeding supplement (PRO-STAT SUGAR FREE 64) Liqd Take 30 mLs by mouth 2 (two) times daily.   furosemide 40 MG tablet Commonly known as:  LASIX Take by mouth daily. Take 1 (40mg ) tab in the morning and half tab (20mg ) in the evening   gabapentin 300 MG capsule Commonly known as:  NEURONTIN Take 1 capsule (300 mg total) by mouth at bedtime.   lactulose 10 GM/15ML solution Commonly known as:  CHRONULAC Take 45 g by mouth daily as needed for mild constipation.   nystatin powder Commonly known as:  MYCOSTATIN/NYSTOP Apply powder to abdominal fold daily for redness   oxyCODONE 5 MG immediate release tablet Commonly known as:  Oxy IR/ROXICODONE Take 1 tablet (5 mg total) by mouth every 4 (four) hours as needed for severe pain.   potassium chloride SA 20 MEQ tablet Commonly known as:  K-DUR,KLOR-CON Take 1 tablet (20 mEq total) by mouth 2 (two) times daily.   promethazine 25 MG tablet Commonly known as:  PHENERGAN Take 25 mg by mouth every 6 (six) hours as needed for nausea or vomiting.   rifaximin 550 MG Tabs tablet Commonly known as:  XIFAXAN Take 1 tablet (550 mg total) by mouth 2 (two) times daily.   rOPINIRole 1 MG tablet Commonly known as:   REQUIP Take 2 mg by mouth at bedtime.   spironolactone 100 MG tablet Commonly known as:  ALDACTONE Take 100 mg by mouth daily.   traZODone 50 MG tablet Commonly known as:  DESYREL Take 50 mg by mouth at bedtime.   Vitamin D3 5000 units Tabs Take 1 tablet by mouth 2 (two) times a week.        Vitals:   12/01/15 1147  BP: 121/69  Pulse: 67  Resp: 18  Temp: 97.7 F (36.5 C)  Weight: 227 lb (103 kg)  Height: 5\' 8"  (1.727 m)   Body mass index is 34.52 kg/m.  Physical Exam  GENERAL APPEARANCE: Alert, conversant. No acute distress.  HEENT: Unremarkable. RESPIRATORY: Breathing is even, unlabored. Lung sounds are clear   CARDIOVASCULAR: Heart RRR no murmurs, rubs or gallops. No peripheral edema.  GASTROINTESTINAL: Abdomen is soft, non-tender, not distended w/ normal bowel sounds.  NEUROLOGIC: Cranial nerves 2-12 grossly intact. Moves all extremities   Labs reviewed: Basic Metabolic Panel:  Recent Labs  11/08/15 0316 11/08/15 1715 11/09/15 0430 11/10/15 0345 11/11/15 11/11/15 0416 11/22/15  NA  --  138  138 138  138 138  138 139 139 135*  K  --  2.8*  2.8* 3.3*  3.3* 3.4*  3.4  --  3.5 4.2  CL  --  111 111 109  --  110  --   CO2  --  24 24 26   --  25  --   GLUCOSE  --  122* 99 94  --  118*  --   BUN  --  32*  32* 33*  33* 30*  30* 24* 24* 20  CREATININE  --  0.95  1.0 0.88  0.9 0.87  0.9 0.9 0.91 1.1  CALCIUM  --  7.6* 8.1* 8.5*  --  8.6*  --   MG 1.4* 1.9 2.0  --   --   --   --    No results found for: [facility] Liver Function Tests:  Recent Labs  11/09/15 0430 11/10/15 0345 11/11/15 0416  AST 104* 89*  89* 64*  ALT 43  43* 45  45* 42  ALKPHOS 107  107 145*  145* 139*  BILITOT 4.1* 3.8* 3.7*  PROT 4.8* 4.9* 4.8*  ALBUMIN 2.2* 2.2* 2.1*   No results for input(s): LIPASE, AMYLASE in the last 8760 hours.  Recent Labs  11/07/15 1940 11/08/15 0215  AMMONIA 34 53*   CBC:  Recent Labs  11/07/15 1910 11/08/15 0215  11/09/15 0430 11/10/15 0345 11/11/15 11/11/15 0416 11/22/15  WBC 7.9  7.9 7.5  7.5 7.5  7.5 5.9  5.9 4.5 4.5 3.3  NEUTROABS 7.2 6.5 5.7  --   --   --   --   HGB 12.8  12.8 11.3*  11.3* 10.6*  10.6* 10.8*  10.8*  --  10.8* 10.5*  HCT 36.7  37 32.5*  33* 30.9*  31* 31.7*  32*  --  31.5* 31*  MCV 97.1 98.2 97.8 97.8  --  100.3*  --   PLT 34*  34* 34*  34* 27*  27* 36*  36*  --  34* 35*   Lipid No results for input(s): CHOL, HDL, LDLCALC, TRIG in the last 8760 hours. Cardiac Enzymes:  Recent Labs  11/08/15 0316 11/08/15 1716 11/08/15 2316 11/09/15 0430  CKTOTAL 1,902*  --   --  659*  TROPONINI  --  0.24* 0.27* 0.24*   BNP: No results for input(s): BNP in the last 8760 hours. CBG:  Recent Labs  11/08/15 1839 11/08/15 2342 11/09/15 0643  GLUCAP 95 106* 92    Procedures and Imaging Studies During Stay: Dg Chest 2 View  Result Date: 11/07/2015 CLINICAL DATA:  70 year old female with weakness, fever, and headache EXAM: CHEST  2 VIEW COMPARISON:  Chest radiograph dated 06/14/2013 FINDINGS: Two views of the chest do not demonstrate a focal consolidation. There is no pleural effusion or pneumothorax. Stable normal cardiac silhouette. No acute osseous pathology identified. Partially visualized left proximal humeral fixation plate and screws. Multiple surgical clips noted in the left breast area. IMPRESSION: No active cardiopulmonary disease. Electronically Signed   By: Anner Crete M.D.   On: 11/07/2015 19:30   Ct Head Wo Contrast  Result Date: 11/07/2015 CLINICAL DATA:  Acute onset of generalized weakness and fever. Altered mental status. Initial encounter. EXAM: CT HEAD WITHOUT CONTRAST TECHNIQUE: Contiguous axial images were obtained from the base of the skull through the vertex without intravenous contrast. COMPARISON:  None. FINDINGS: Brain: No evidence of acute infarction, hemorrhage, hydrocephalus, extra-axial collection or mass lesion/mass effect. The posterior  fossa, including the cerebellum, brainstem and fourth ventricle, is within normal limits. The third and lateral ventricles, and basal ganglia are unremarkable in appearance. The cerebral hemispheres are symmetric in appearance, with normal gray-white differentiation. No mass effect or midline shift is seen. Vascular: No hyperdense vessel or unexpected calcification. Skull: There is no evidence of fracture; visualized osseous structures are unremarkable in appearance. Sinuses/Orbits: The visualized portions of the orbits are within normal limits. The paranasal sinuses and mastoid air cells are well-aerated. Other: No significant soft tissue abnormalities are seen. IMPRESSION: Unremarkable noncontrast CT of the head. Electronically Signed   By: Garald Balding M.D.   On: 11/07/2015 23:29   US Abdomen Complete  Result Date: 11/08/2015 CLINICAL DATA:  Evaluate for portal vein thrombosis. Elevated LFTs. Initial encounter. EXAM: EXAM ULTRASOUND ABDOMEN COMPLETE DUPLEX ULTRASOUND OF LIVER TECHNIQUE: Complete abdominal ultrasound examination was performed. Color and duplex Doppler ultrasound was  also performed to evaluate the hepatic in-flow and out-flow vessels. COMPARISON:  CT of the abdomen and pelvis performed 10/01/2013, and abdominal ultrasound performed 04/09/2014 FINDINGS: Portal Vein Velocities Main: 31.2 cm/sec Right: 15.7  cm/sec Left: 13.3 cm/sec Hepatic Vein Velocities Right: 25.5 cm/sec Middle: 30.7 cm/sec Left: 22.6 cm/sec Hepatic artery velocity: 75.2 cm/sec Splenic vein velocity: 29.0 cm/sec Varices:  Known mild varices are difficult to fully characterize. Ascites: None seen. Vascular structures: There appears to be a left-sided recanalized umbilical vein. Normal hepatopetal flow is seen within the portal venous system, while normal triphasic antegrade flow is noted within the hepatic veins. There is no evidence of thrombosis. Gallbladder: Status post cholecystectomy.  No retained stones seen. Common bile  duct: Diameter: 1.3 cm, likely within normal limits status post cholecystectomy. Liver: No focal lesion identified. Coarsened echotexture likely reflects fatty infiltration. The nodular contour raises concern for mild hepatic cirrhosis. IVC: No abnormality visualized. Pancreas: Not visualized due to overlying bowel gas. Spleen: Enlarged, measuring 18.4 cm in length. Right Kidney: Length: 11.2 cm. Echogenicity within normal limits. No mass or hydronephrosis visualized. Left Kidney: Length: 11.6 cm. Echogenicity within normal limits. No mass or hydronephrosis visualized. Abdominal aorta: No aneurysm visualized. Not characterized distally due to overlying bowel gas. Other Findings: None. IMPRESSION: 1. No evidence of portal vein thrombosis. 2. Changes of mild hepatic cirrhosis, with recanalized left-sided umbilical vein. 3. Normal hepatopetal flow within the portal venous system, and normal triphasic vascular flow within the hepatic veins. 4. Splenomegaly noted. 5. Known mild varices are difficult to fully characterize. Electronically Signed   By: Garald Balding M.D.   On: 11/08/2015 02:27   Dg Pelvis Portable  Result Date: 11/07/2015 CLINICAL DATA:  Status post fall, with concern for pelvic injury. Initial encounter. EXAM: PORTABLE PELVIS 1-2 VIEWS COMPARISON:  CT of the abdomen and pelvis performed 06/03/2013 FINDINGS: There is no evidence of fracture or dislocation. Both femoral heads are seated normally within their respective acetabula. No significant degenerative change is appreciated. The sacroiliac joints are unremarkable in appearance. The visualized bowel gas pattern is grossly unremarkable in appearance. Clips are seen overlying the left inguinal region. IMPRESSION: No evidence of fracture or dislocation. Electronically Signed   By: Garald Balding M.D.   On: 11/07/2015 23:21   [place] Art/ven Flow Abd Pelv Doppler  Result Date: 11/08/2015 CLINICAL DATA:  Evaluate for portal vein thrombosis. Elevated LFTs.  Initial encounter. EXAM: EXAM ULTRASOUND ABDOMEN COMPLETE DUPLEX ULTRASOUND OF LIVER TECHNIQUE: Complete abdominal ultrasound examination was performed. Color and duplex Doppler ultrasound was also performed to evaluate the hepatic in-flow and out-flow vessels. COMPARISON:  CT of the abdomen and pelvis performed 10/01/2013, and abdominal ultrasound performed 04/09/2014 FINDINGS: Portal Vein Velocities Main: 31.2 cm/sec Right: 15.7  cm/sec Left: 13.3 cm/sec Hepatic Vein Velocities Right: 25.5 cm/sec Middle: 30.7 cm/sec Left: 22.6 cm/sec Hepatic artery velocity: 75.2 cm/sec Splenic vein velocity: 29.0 cm/sec Varices:  Known mild varices are difficult to fully characterize. Ascites: None seen. Vascular structures: There appears to be a left-sided recanalized umbilical vein. Normal hepatopetal flow is seen within the portal venous system, while normal triphasic antegrade flow is noted within the hepatic veins. There is no evidence of thrombosis. Gallbladder: Status post cholecystectomy.  No retained stones seen. Common bile duct: Diameter: 1.3 cm, likely within normal limits status post cholecystectomy. Liver: No focal lesion identified. Coarsened echotexture likely reflects fatty infiltration. The nodular contour raises concern for mild hepatic cirrhosis. IVC: No abnormality visualized. Pancreas: Not visualized due to overlying  bowel gas. Spleen: Enlarged, measuring 18.4 cm in length. Right Kidney: Length: 11.2 cm. Echogenicity within normal limits. No mass or hydronephrosis visualized. Left Kidney: Length: 11.6 cm. Echogenicity within normal limits. No mass or hydronephrosis visualized. Abdominal aorta: No aneurysm visualized. Not characterized distally due to overlying bowel gas. Other Findings: None. IMPRESSION: 1. No evidence of portal vein thrombosis. 2. Changes of mild hepatic cirrhosis, with recanalized left-sided umbilical vein. 3. Normal hepatopetal flow within the portal venous system, and normal triphasic  vascular flow within the hepatic veins. 4. Splenomegaly noted. 5. Known mild varices are difficult to fully characterize. Electronically Signed   By: Garald Balding M.D.   On: 11/08/2015 02:27   Ir Fluoro Guide Cv Line Right  Result Date: 11/08/2015 CLINICAL DATA:  Sepsis, needs durable venous access. EXAM: PICC PLACEMENT WITH ULTRASOUND AND FLUOROSCOPY FLUOROSCOPY TIME:  6 seconds, 1 mGy TECHNIQUE: After written informed consent was obtained, patient was placed in the supine position on angiographic table. Patency of the right basilic vein was confirmed with ultrasound with image documentation. An appropriate skin site was determined. Skin site was marked. Region was prepped using maximum barrier technique including cap and mask, sterile gown, sterile gloves, large sterile sheet, and Chlorhexidine as cutaneous antisepsis. The region was infiltrated locally with 1% lidocaine. Under real-time ultrasound guidance, the right basilic vein was accessed with a 21 gauge micropuncture needle; the needle tip within the vein was confirmed with ultrasound image documentation. Needle exchanged over a 018 guidewire for a peel-away sheath, through which a 5-French double-lumen power injectable PICC trimmed to 38cm was advanced, positioned with its tip near the cavoatrial junction. Spot chest radiograph confirms appropriate catheter position. Catheter was flushed per protocol and secured externally. The patient tolerated procedure well. COMPLICATIONS: COMPLICATIONS none IMPRESSION: 1. Technically successful five [place] double lumen power injectable PICC placement Electronically Signed   By: Lucrezia Europe M.D.   On: 11/08/2015 16:33   Ir US Guide Vasc Access Right  Result Date: 11/08/2015 CLINICAL DATA:  Sepsis, needs durable venous access. EXAM: PICC PLACEMENT WITH ULTRASOUND AND FLUOROSCOPY FLUOROSCOPY TIME:  6 seconds, 1 mGy TECHNIQUE: After written informed consent was obtained, patient was placed in the supine position on  angiographic table. Patency of the right basilic vein was confirmed with ultrasound with image documentation. An appropriate skin site was determined. Skin site was marked. Region was prepped using maximum barrier technique including cap and mask, sterile gown, sterile gloves, large sterile sheet, and Chlorhexidine as cutaneous antisepsis. The region was infiltrated locally with 1% lidocaine. Under real-time ultrasound guidance, the right basilic vein was accessed with a 21 gauge micropuncture needle; the needle tip within the vein was confirmed with ultrasound image documentation. Needle exchanged over a 018 guidewire for a peel-away sheath, through which a 5-French double-lumen power injectable PICC trimmed to 38cm was advanced, positioned with its tip near the cavoatrial junction. Spot chest radiograph confirms appropriate catheter position. Catheter was flushed per protocol and secured externally. The patient tolerated procedure well. COMPLICATIONS: COMPLICATIONS none IMPRESSION: 1. Technically successful five [place] double lumen power injectable PICC placement Electronically Signed   By: Lucrezia Europe M.D.   On: 11/08/2015 16:33    Assessment/Plan:   Cellulitis of right lower extremity  Cellulitis of right upper extremity  Other hypervolemia  Hypokalemia  Sepsis, due to unspecified organism (HCC)  RLS (restless legs syndrome)  Thrombocytopenia, unspecified (HCC)  Vitamin D deficiency  Coagulopathy (HCC)  Non-traumatic rhabdomyolysis  Hepatic encephalopathy (HCC)  Cirrhosis, non-alcoholic ([place])  Essential hypertension   Patient is being discharged with the following home health services: HH/OT/PT/nursing  Patient is being discharged with the following durable medical equipment:  3 -in-1 commode  Patient has been advised to f/u with their PCP in 1-2 weeks to bring them up to date on their rehab stay.  Social services at facility was responsible for arranging this appointment.  Pt  was provided with a 30 day supply of prescriptions for medications and refills must be obtained from their PCP.  For controlled substances, a more limited supply may be provided adequate until PCP appointment only.  Future labs/tests needed: BMP to follow Cr   Time spent > 30 min;> 50% of time with patient was spent reviewing records, labs, tests and studies, counseling and developing plan of care  Webb Silversmith D. Sheppard Coil, MD

## 2015-12-02 ENCOUNTER — Encounter: Payer: Self-pay | Admitting: Cardiology

## 2015-12-02 NOTE — Progress Notes (Signed)
Carelink Summary Report / Loop Recorder 

## 2016-01-02 ENCOUNTER — Ambulatory Visit (INDEPENDENT_AMBULATORY_CARE_PROVIDER_SITE_OTHER): Payer: Medicare Other | Admitting: *Deleted

## 2016-01-02 DIAGNOSIS — R55 Syncope and collapse: Secondary | ICD-10-CM | POA: Diagnosis not present

## 2016-01-02 LAB — CUP PACEART REMOTE DEVICE CHECK
Date Time Interrogation Session: 20171028213848
Implantable Pulse Generator Implant Date: 20170131

## 2016-01-02 NOTE — Progress Notes (Signed)
Carelink Summary Report / Loop Recorder 

## 2016-01-13 LAB — CUP PACEART REMOTE DEVICE CHECK
Implantable Pulse Generator Implant Date: 20170131
MDC IDC SESS DTM: 20170928213747

## 2016-01-13 NOTE — Progress Notes (Signed)
Carelink summary report received. Battery status OK. Normal device function. No new symptom episodes, tachy episodes, brady, or pause episodes. No new AF episodes. Monthly summary reports and ROV/PRN 

## 2016-01-30 ENCOUNTER — Ambulatory Visit (INDEPENDENT_AMBULATORY_CARE_PROVIDER_SITE_OTHER): Payer: Medicare Other | Admitting: *Deleted

## 2016-01-30 DIAGNOSIS — R55 Syncope and collapse: Secondary | ICD-10-CM

## 2016-01-31 NOTE — Progress Notes (Signed)
Carelink Summary Report / Loop Recorder 

## 2016-02-06 NOTE — Progress Notes (Signed)
Carelink summary report received. Battery status OK. Normal device function. No new symptom episodes, tachy episodes, brady, or pause episodes. No new AF episodes. Monthly summary reports and ROV/PRN 

## 2016-02-29 ENCOUNTER — Ambulatory Visit (INDEPENDENT_AMBULATORY_CARE_PROVIDER_SITE_OTHER): Payer: Medicare Other | Admitting: *Deleted

## 2016-02-29 DIAGNOSIS — R55 Syncope and collapse: Secondary | ICD-10-CM

## 2016-03-01 NOTE — Progress Notes (Signed)
Carelink Summary Report / Loop Recorder 

## 2016-03-10 LAB — CUP PACEART REMOTE DEVICE CHECK
Implantable Pulse Generator Implant Date: 20170131
MDC IDC SESS DTM: 20171128003611

## 2016-03-10 NOTE — Progress Notes (Signed)
Carelink summary report received. Battery status OK. Normal device function. No new symptom episodes, tachy episodes, brady, or pause episodes. No new AF episodes. Monthly summary reports and ROV/PRN 

## 2016-03-30 ENCOUNTER — Ambulatory Visit (INDEPENDENT_AMBULATORY_CARE_PROVIDER_SITE_OTHER): Payer: Medicare Other | Admitting: *Deleted

## 2016-03-30 DIAGNOSIS — R55 Syncope and collapse: Secondary | ICD-10-CM | POA: Diagnosis not present

## 2016-04-02 NOTE — Progress Notes (Signed)
Carelink Summary Report / Loop Recorder 

## 2016-04-12 LAB — CUP PACEART REMOTE DEVICE CHECK
Date Time Interrogation Session: 20171228010904
MDC IDC PG IMPLANT DT: 20170131

## 2016-04-29 LAB — CUP PACEART REMOTE DEVICE CHECK
Date Time Interrogation Session: 20180127013838
MDC IDC PG IMPLANT DT: 20170131

## 2016-04-30 ENCOUNTER — Ambulatory Visit (INDEPENDENT_AMBULATORY_CARE_PROVIDER_SITE_OTHER): Payer: Medicare Other | Admitting: *Deleted

## 2016-04-30 DIAGNOSIS — R55 Syncope and collapse: Secondary | ICD-10-CM | POA: Diagnosis not present

## 2016-04-30 NOTE — Progress Notes (Signed)
Carelink Summary Report / Loop Recorder 

## 2016-05-16 LAB — CUP PACEART REMOTE DEVICE CHECK
Date Time Interrogation Session: 20180226013757
Implantable Pulse Generator Implant Date: 20170131

## 2016-05-28 ENCOUNTER — Ambulatory Visit (INDEPENDENT_AMBULATORY_CARE_PROVIDER_SITE_OTHER): Payer: Medicare Other | Admitting: *Deleted

## 2016-05-28 DIAGNOSIS — R55 Syncope and collapse: Secondary | ICD-10-CM

## 2016-05-30 NOTE — Progress Notes (Signed)
Carelink Summary Report / Loop Recorder 

## 2016-06-13 LAB — CUP PACEART REMOTE DEVICE CHECK
MDC IDC PG IMPLANT DT: 20170131
MDC IDC SESS DTM: 20180328014434

## 2016-06-28 ENCOUNTER — Ambulatory Visit (INDEPENDENT_AMBULATORY_CARE_PROVIDER_SITE_OTHER): Admitting: *Deleted

## 2016-06-28 DIAGNOSIS — R55 Syncope and collapse: Secondary | ICD-10-CM

## 2016-06-29 NOTE — Progress Notes (Signed)
Carelink Summary Report / Loop Recorder 

## 2016-07-16 ENCOUNTER — Encounter (HOSPITAL_COMMUNITY): Payer: Self-pay | Admitting: Emergency Medicine

## 2016-07-16 ENCOUNTER — Observation Stay (HOSPITAL_COMMUNITY)
Admission: EM | Admit: 2016-07-16 | Discharge: 2016-07-18 | Disposition: A | Discharge status: Hospice | Payer: Medicare Other | Attending: Family Medicine | Admitting: Family Medicine

## 2016-07-16 ENCOUNTER — Emergency Department (HOSPITAL_COMMUNITY): Payer: Medicare Other

## 2016-07-16 DIAGNOSIS — G9340 Encephalopathy, unspecified: Secondary | ICD-10-CM | POA: Diagnosis not present

## 2016-07-16 DIAGNOSIS — I119 Hypertensive heart disease without heart failure: Secondary | ICD-10-CM | POA: Insufficient documentation

## 2016-07-16 DIAGNOSIS — F329 Major depressive disorder, single episode, unspecified: Secondary | ICD-10-CM | POA: Insufficient documentation

## 2016-07-16 DIAGNOSIS — K7460 Unspecified cirrhosis of liver: Secondary | ICD-10-CM | POA: Insufficient documentation

## 2016-07-16 DIAGNOSIS — E119 Type 2 diabetes mellitus without complications: Secondary | ICD-10-CM | POA: Diagnosis not present

## 2016-07-16 DIAGNOSIS — D696 Thrombocytopenia, unspecified: Secondary | ICD-10-CM | POA: Insufficient documentation

## 2016-07-16 DIAGNOSIS — Z79899 Other long term (current) drug therapy: Secondary | ICD-10-CM | POA: Insufficient documentation

## 2016-07-16 DIAGNOSIS — B9562 Methicillin resistant Staphylococcus aureus infection as the cause of diseases classified elsewhere: Secondary | ICD-10-CM | POA: Diagnosis not present

## 2016-07-16 DIAGNOSIS — Z66 Do not resuscitate: Secondary | ICD-10-CM | POA: Insufficient documentation

## 2016-07-16 DIAGNOSIS — G4730 Sleep apnea, unspecified: Secondary | ICD-10-CM | POA: Insufficient documentation

## 2016-07-16 DIAGNOSIS — N3000 Acute cystitis without hematuria: Secondary | ICD-10-CM

## 2016-07-16 DIAGNOSIS — K7682 Hepatic encephalopathy: Secondary | ICD-10-CM

## 2016-07-16 DIAGNOSIS — N390 Urinary tract infection, site not specified: Secondary | ICD-10-CM | POA: Diagnosis not present

## 2016-07-16 DIAGNOSIS — K7200 Acute and subacute hepatic failure without coma: Secondary | ICD-10-CM | POA: Diagnosis present

## 2016-07-16 DIAGNOSIS — I471 Supraventricular tachycardia: Secondary | ICD-10-CM | POA: Insufficient documentation

## 2016-07-16 DIAGNOSIS — F411 Generalized anxiety disorder: Secondary | ICD-10-CM

## 2016-07-16 DIAGNOSIS — K7290 Hepatic failure, unspecified without coma: Secondary | ICD-10-CM

## 2016-07-16 DIAGNOSIS — R531 Weakness: Secondary | ICD-10-CM | POA: Diagnosis present

## 2016-07-16 DIAGNOSIS — Z515 Encounter for palliative care: Secondary | ICD-10-CM | POA: Diagnosis not present

## 2016-07-16 LAB — CBC WITH DIFFERENTIAL/PLATELET
BASOS ABS: 0 10*3/uL (ref 0.0–0.1)
BASOS PCT: 0 %
EOS ABS: 0 10*3/uL (ref 0.0–0.7)
EOS PCT: 0 %
HCT: 37.5 % (ref 36.0–46.0)
Hemoglobin: 12.6 g/dL (ref 12.0–15.0)
LYMPHS PCT: 22 %
Lymphs Abs: 0.8 10*3/uL (ref 0.7–4.0)
MCH: 34.5 pg — ABNORMAL HIGH (ref 26.0–34.0)
MCHC: 33.6 g/dL (ref 30.0–36.0)
MCV: 102.7 fL — ABNORMAL HIGH (ref 78.0–100.0)
Monocytes Absolute: 0.2 10*3/uL (ref 0.1–1.0)
Monocytes Relative: 5 %
Neutro Abs: 2.6 10*3/uL (ref 1.7–7.7)
Neutrophils Relative %: 73 %
PLATELETS: 37 10*3/uL — AB (ref 150–400)
RBC: 3.65 MIL/uL — AB (ref 3.87–5.11)
RDW: 16.1 % — AB (ref 11.5–15.5)
WBC: 3.6 10*3/uL — AB (ref 4.0–10.5)

## 2016-07-16 LAB — HEPATIC FUNCTION PANEL
ALK PHOS: 287 U/L — AB (ref 38–126)
ALT: 33 U/L (ref 14–54)
AST: 74 U/L — ABNORMAL HIGH (ref 15–41)
Albumin: 2.4 g/dL — ABNORMAL LOW (ref 3.5–5.0)
BILIRUBIN TOTAL: 5 mg/dL — AB (ref 0.3–1.2)
Bilirubin, Direct: 1.7 mg/dL — ABNORMAL HIGH (ref 0.1–0.5)
Indirect Bilirubin: 3.3 mg/dL — ABNORMAL HIGH (ref 0.3–0.9)
Total Protein: 5.7 g/dL — ABNORMAL LOW (ref 6.5–8.1)

## 2016-07-16 LAB — BASIC METABOLIC PANEL
Anion gap: 6 (ref 5–15)
BUN: 20 mg/dL (ref 6–20)
CALCIUM: 8.4 mg/dL — AB (ref 8.9–10.3)
CHLORIDE: 104 mmol/L (ref 101–111)
CO2: 31 mmol/L (ref 22–32)
Creatinine, Ser: 1.31 mg/dL — ABNORMAL HIGH (ref 0.44–1.00)
GFR calc Af Amer: 47 mL/min — ABNORMAL LOW (ref >60)
GFR calc non Af Amer: 41 mL/min — ABNORMAL LOW (ref >60)
Glucose, Bld: 117 mg/dL — ABNORMAL HIGH (ref 65–99)
Potassium: 3.3 mmol/L — ABNORMAL LOW (ref 3.5–5.1)
Sodium: 141 mmol/L (ref 135–145)

## 2016-07-16 LAB — CBG MONITORING, ED: GLUCOSE-CAPILLARY: 87 mg/dL (ref 65–99)

## 2016-07-16 MED ORDER — SODIUM CHLORIDE 0.9 % IV BOLUS (SEPSIS)
500.0000 mL | Freq: Once | INTRAVENOUS | Status: AC
  Administered 2016-07-16: 500 mL via INTRAVENOUS

## 2016-07-16 NOTE — ED Triage Notes (Signed)
Pt arrives via EMS from home with weakness over the last week, progressively worse over this week. Hospice care for liver failure. Fentanyl patch on R chest.   CBG 140

## 2016-07-16 NOTE — ED Provider Notes (Signed)
[place] DEPT Provider Note   CSN: [id number] Arrival date & time: 07/16/16  2143     History   Chief Complaint Chief Complaint  Patient presents with  . Weakness    HPI [name] is a 71 y.o. female.  Patient with nonalcoholic cirrhosis, DM, asthma, breast cancer, CKD, HTN, presents with progressively worsening weakness for the past 2-3 days. Family is at bedside and report the patient has gotten to where she cannot get out of the bed despite assistance of boyfriend and family. No vomiting, SOB, cough. No known fever. She is currently on Hospice care for liver disease. Family has noted her urine is "orange" in color without mention of malodor.    The history is provided by the patient. No language interpreter was used.  Weakness  Associated symptoms include confusion. Pertinent negatives include no shortness of breath, no chest pain and no vomiting.    Past Medical History:  Diagnosis Date  . Anxiety   . Asthma   . Blood dyscrasia    low platelets ... low wbc.  ? liver  disease.   . Cancer [facility]) 2005   left breast  . Chronic kidney disease 50's   nephritis--none at present *12/03/2011)  . Cirrhosis ([place]) 2.5 yrs   never a drinker.  platelets  low... wbc also low per pt ..   . Complication of anesthesia    had difficulty going to sleep, staying asleep during surgery and was combative after procedure.  . Depression   . Diabetes mellitus    no meds  . Family history of anesthesia complication    Daughter has complications with Versed  . H/O hiatal hernia   . Headache(784.0)   . Heart murmur   . Hypertension   . Platelets decreased ([place])    ??   D/T CIRRHOISIS  . Portal hypertension ([place])   . PSVT (paroxysmal supraventricular tachycardia) ([place])   . Restless leg   . Shortness of breath    with exertion   . Sleep apnea 2013   sleep study at [facility]    uses cpap    Patient Active Problem List   Diagnosis Date Noted  . Anasarca 11/26/2015  . Hepatic  encephalopathy ([place]) 11/15/2015  . Coagulopathy ([place]) 11/15/2015  . Other hypervolemia 11/15/2015  . Hypokalemia 11/15/2015  . Rhabdomyolysis 11/15/2015  . RLS (restless legs syndrome) 11/15/2015  . Vitamin D deficiency 11/15/2015  . Cellulitis of right upper extremity 11/15/2015  . Sepsis ([place]) 11/07/2015  . Cellulitis of right lower extremity 11/07/2015  . Elevated LFTs   . Accelerated junctional rhythm 07/12/2015  . Encounter for loop recorder check 07/11/2015  . Syncope 03/28/2015  . Generalized weakness 07/09/2013  . Fall at home 07/09/2013  . S/P tooth extraction 07/09/2013  . Weakness 07/09/2013  . OSA (obstructive sleep apnea) 11/01/2012  . MVP (mitral valve prolapse) 11/01/2012  . PSVT (paroxysmal supraventricular tachycardia) ([place]) 11/01/2012  . Cirrhosis, non-alcoholic ([place]) 34/19/6222  . HTN (hypertension) 11/01/2012  . Thrombocytopenia, unspecified ([place]) 12/14/2011  . Acute blood loss anemia 12/14/2011  . 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 ABDOMINAL HYSTERECTOMY    . APPENDECTOMY    . BREAST SURGERY     left lumpectomy--few nodes removed  . CARDIAC CATHETERIZATION  1975   normal cath  -low potassium  . CHOLECYSTECTOMY    . COLONOSCOPY W/ BIOPSIES AND POLYPECTOMY    . COLONOSCOPY WITH  PROPOFOL N/A 10/21/2013   Procedure: COLONOSCOPY WITH PROPOFOL;  Surgeon: [name], MD;  Location: [facility] ENDOSCOPY;  Service: Endoscopy;  Laterality: N/A;  ultra slim scope   . EP IMPLANTABLE DEVICE N/A 04/05/2015   Procedure: Loop Recorder Insertion;  Surgeon: Sanda Klein, MD;  Location: [place] CV LAB;  Service: Cardiovascular;  Laterality: N/A;  . ESOPHAGOGASTRODUODENOSCOPY (EGD) WITH PROPOFOL N/A 10/21/2013   Procedure: ESOPHAGOGASTRODUODENOSCOPY (EGD) WITH PROPOFOL;  Surgeon: [name], MD;  Location: [facility] ENDOSCOPY;  Service: Endoscopy;  Laterality: N/A;  . IR GENERIC HISTORICAL  11/08/2015   IR  FLUORO GUIDE CV LINE RIGHT 11/08/2015 [facility]-INTERV RAD  . IR GENERIC HISTORICAL  11/08/2015   IR US GUIDE VASC ACCESS RIGHT 11/08/2015 [facility]-INTERV RAD  . MASTECTOMY    . NM MYOCAR PERF WALL MOTION  08/17/2008   mild to mod. superimposed ischemiamid antereoseptal,apical anterior & apical regions  . ORIF HUMERUS FRACTURE  06/19/2011   Procedure: OPEN REDUCTION INTERNAL FIXATION (ORIF) DISTAL HUMERUS FRACTURE;  Surgeon: [name], MD;  Location: [place];  Service: Orthopedics;  Laterality: Left;  . ORIF HUMERUS FRACTURE  12/11/2011   Procedure: OPEN REDUCTION INTERNAL FIXATION (ORIF) HUMERAL SHAFT FRACTURE;  Surgeon: [name], MD;  Location: [place];  Service: Orthopedics;  Laterality: Left;   Removal of Hardware left humerus, Open Reduction Internal Fixation Left Humeral Shaft Fracture Non-Union  . TONSILLECTOMY    . US ECHOCARDIOGRAPHY  03/16/2011   mild concentric LVH,mod LAE,stage I diastolic dysfunction,MAC,mild MR,aortic sclerosis,mild TR    OB History    No data available       Home Medications    Prior to Admission medications   Medication Sig Start Date End Date Taking? Authorizing Provider  acetaminophen (TYLENOL) 325 MG tablet Take 325 mg by mouth every 6 (six) hours as needed for mild pain.    [provider]  Amino Acids-Protein Hydrolys (FEEDING SUPPLEMENT, PRO-STAT SUGAR FREE 64,) LIQD Take 30 mLs by mouth 2 (two) times daily.    [provider]  antiseptic oral rinse (BIOTENE) LIQD 15 mLs by Mouth Rinse route 2 (two) times daily as needed for dry mouth.    [provider]  buPROPion (WELLBUTRIN) 75 MG tablet Take 1 tablet (65m) in the morning, then take 2 tablets (1542m at bedtime    [provider]  Cholecalciferol (VITAMIN D3) 5000 units TABS Take 1 tablet by mouth 2 (two) times a week.    [provider]  diphenhydrAMINE (BENADRYL) 25 mg capsule Take 1 capsule (25 mg total) by mouth every 6 (six) hours as needed  for itching, allergies or sleep. 11/12/15   VaGeradine GirtDO  furosemide (LASIX) 40 MG tablet Take by mouth daily. Take 1 (4078mtab in the morning and half tab (31m48mn the evening    [provider]  gabapentin (NEURONTIN) 300 MG capsule Take 1 capsule (300 mg total) by mouth at bedtime. 11/13/15   VannGeradine Girt  lactulose (CHRONULAC) 10 GM/15ML solution Take 45 g by mouth daily as needed for mild constipation.     [provider]  nystatin (MYCOSTATIN/NYSTOP) powder Apply powder to abdominal fold daily for redness    [provider]  oxyCODONE (OXY IR/ROXICODONE) 5 MG immediate release tablet Take 1 tablet (5 mg total) by mouth every 4 (four) hours as needed for severe pain. Patient taking differently: Take 5 mg by mouth every 6 (six) hours as needed for severe pain.  11/12/15  Geradine Girt, DO  potassium chloride SA (K-DUR,KLOR-CON) 20 MEQ tablet Take 1 tablet (20 mEq total) by mouth 2 (two) times daily. 06/29/14   Molpus, John, MD  promethazine (PHENERGAN) 25 MG tablet Take 25 mg by mouth every 6 (six) hours as needed for nausea or vomiting.    [provider]  rifaximin (XIFAXAN) 550 MG TABS tablet Take 1 tablet (550 mg total) by mouth 2 (two) times daily. 11/12/15   Geradine Girt, DO  rOPINIRole (REQUIP) 1 MG tablet Take 2 mg by mouth at bedtime.     [provider]  spironolactone (ALDACTONE) 100 MG tablet Take 100 mg by mouth daily. 06/30/13   [provider]  traZODone (DESYREL) 50 MG tablet Take 50 mg by mouth at bedtime.    [provider]    Family History Family History  Problem Relation Age of Onset  . Stroke Father   . Anesthesia problems Daughter   . Hyperlipidemia Brother     Social History Social History  Substance Use Topics  . Smoking status: Never Smoker  . Smokeless tobacco: Never Used  . Alcohol use No     Allergies   Ondansetron hcl; Penicillins; Aspirin; Midazolam hcl; and Niacin and  related   Review of Systems Review of Systems  Constitutional: Negative for chills and fever.  HENT: Negative.  Negative for congestion and trouble swallowing.   Respiratory: Negative.  Negative for cough and shortness of breath.   Cardiovascular: Negative.  Negative for chest pain.  Gastrointestinal: Negative.  Negative for abdominal pain, nausea and vomiting.  Genitourinary: Positive for decreased urine volume. Negative for dysuria.       See HPI.  Musculoskeletal: Negative.  Negative for back pain and myalgias.  Skin: Negative.   Neurological: Positive for weakness.  Psychiatric/Behavioral: Positive for confusion.     Physical Exam Updated Vital Signs BP (!) 144/63 (BP Location: Right Arm)   Pulse 66   Temp 98.1 F (36.7 C) (Oral)   Resp 12   Ht _0  (1.727 m)   Wt 90.7 kg   SpO2 98%   BMI 30.41 kg/m   Physical Exam  Constitutional: She appears well-developed and well-nourished.  HENT:  Head: Normocephalic.  Mouth/Throat: Mucous membranes are not pale and dry.  Neck: Normal range of motion. Neck supple.  Cardiovascular: Normal rate and regular rhythm.   Murmur heard. Pulmonary/Chest: Effort normal and breath sounds normal. She has no wheezes. She has no rales.  Abdominal: Soft. Bowel sounds are normal. There is no tenderness. There is no rebound and no guarding.  Musculoskeletal: Normal range of motion.  Neurological: She is alert.  Patient appears profoundly weak. No lateralizing weakness. Has difficulty in following commands.   Skin: Skin is warm and dry. No rash noted.  Psychiatric: She has a normal mood and affect.     ED Treatments / Results  Labs (all labs ordered are listed, but only abnormal results are displayed) Labs Reviewed  BASIC METABOLIC PANEL - Abnormal; Notable for the following:       Result Value   Potassium 3.3 (*)    Glucose, Bld 117 (*)    Creatinine, Ser 1.31 (*)    Calcium 8.4 (*)    GFR calc non Af Amer 41 (*)    GFR calc Af  Amer 47 (*)    All other components within normal limits  CULTURE, BLOOD (ROUTINE X 2)  CULTURE, BLOOD (ROUTINE X 2)  URINE CULTURE  LACTIC  ACID, PLASMA  LACTIC ACID, PLASMA  CBC WITH DIFFERENTIAL/PLATELET  URINALYSIS, ROUTINE W REFLEX MICROSCOPIC  CBG MONITORING, ED   Results for orders placed or performed during the hospital encounter of 93/26/71  Basic metabolic panel  Result Value Ref Range   Sodium 141 135 - 145 mmol/L   Potassium 3.3 (L) 3.5 - 5.1 mmol/L   Chloride 104 101 - 111 mmol/L   CO2 31 22 - 32 mmol/L   Glucose, Bld 117 (H) 65 - 99 mg/dL   BUN 20 6 - 20 mg/dL   Creatinine, Ser 1.31 (H) 0.44 - 1.00 mg/dL   Calcium 8.4 (L) 8.9 - 10.3 mg/dL   GFR calc non Af Amer 41 (L) >60 mL/min   GFR calc Af Amer 47 (L) >60 mL/min   Anion gap 6 5 - 15  Lactic acid, plasma  Result Value Ref Range   Lactic Acid, Venous 1.4 0.5 - 1.9 mmol/L  CBC with Differential  Result Value Ref Range   WBC 3.6 (L) 4.0 - 10.5 K/uL   RBC 3.65 (L) 3.87 - 5.11 MIL/uL   Hemoglobin 12.6 12.0 - 15.0 g/dL   HCT 37.5 36.0 - 46.0 %   MCV 102.7 (H) 78.0 - 100.0 fL   MCH 34.5 (H) 26.0 - 34.0 pg   MCHC 33.6 30.0 - 36.0 g/dL   RDW 16.1 (H) 11.5 - 15.5 %   Platelets 37 (L) 150 - 400 K/uL   Neutrophils Relative % 73 %   Neutro Abs 2.6 1.7 - 7.7 K/uL   Lymphocytes Relative 22 %   Lymphs Abs 0.8 0.7 - 4.0 K/uL   Monocytes Relative 5 %   Monocytes Absolute 0.2 0.1 - 1.0 K/uL   Eosinophils Relative 0 %   Eosinophils Absolute 0.0 0.0 - 0.7 K/uL   Basophils Relative 0 %   Basophils Absolute 0.0 0.0 - 0.1 K/uL  Urinalysis, Routine w reflex microscopic  Result Value Ref Range   Color, Urine AMBER (A) YELLOW   APPearance CLEAR CLEAR   Specific Gravity, Urine 1.014 1.005 - 1.030   pH 6.0 5.0 - 8.0   Glucose, UA NEGATIVE NEGATIVE mg/dL   Hgb urine dipstick SMALL (A) NEGATIVE   Bilirubin Urine NEGATIVE NEGATIVE   Ketones, ur NEGATIVE NEGATIVE mg/dL   Protein, ur NEGATIVE NEGATIVE mg/dL   Nitrite NEGATIVE  NEGATIVE   Leukocytes, UA LARGE (A) NEGATIVE   RBC / HPF 6-30 0 - 5 RBC/hpf   WBC, UA TOO NUMEROUS TO COUNT 0 - 5 WBC/hpf   Bacteria, UA MANY (A) NONE SEEN   Squamous Epithelial / LPF 0-5 (A) NONE SEEN   Mucous PRESENT    Ca Oxalate Crys, UA PRESENT   Ammonia  Result Value Ref Range   Ammonia 120 (H) 9 - 35 umol/L  Protime-INR  Result Value Ref Range   Prothrombin Time 23.5 (H) 11.4 - 15.2 seconds   INR 2.06   Hepatic function panel  Result Value Ref Range   Total Protein 5.7 (L) 6.5 - 8.1 g/dL   Albumin 2.4 (L) 3.5 - 5.0 g/dL   AST 74 (H) 15 - 41 U/L   ALT 33 14 - 54 U/L   Alkaline Phosphatase 287 (H) 38 - 126 U/L   Total Bilirubin 5.0 (H) 0.3 - 1.2 mg/dL   Bilirubin, Direct 1.7 (H) 0.1 - 0.5 mg/dL   Indirect Bilirubin 3.3 (H) 0.3 - 0.9 mg/dL  CBG monitoring, ED  Result Value Ref Range   Glucose-Capillary 87  65 - 99 mg/dL    EKG  EKG Interpretation  Date/Time:  Monday Jul 16 2016 21:51:23 EDT Ventricular Rate:  71 PR Interval:    QRS Duration: 105 QT Interval:  449 QTC Calculation: 488 R Axis:   6 Text Interpretation:  Sinus rhythm Multiple premature complexes, vent & supraven Borderline short PR interval Probable left ventricular hypertrophy Borderline prolonged QT interval Abnormal ekg Confirmed by Carmin Muskrat [phone]) on 07/16/2016 9:54:29 PM       Radiology No results found. Dg Chest 1 View  Result Date: 07/16/2016 CLINICAL DATA:  Progressive weakness. EXAM: CHEST 1 VIEW COMPARISON:  Chest radiograph 11/07/2015 FINDINGS: Stable cardiomegaly and mediastinal contours. Vascular congestion without overt pulmonary edema. Loop recorder projects over the left chest wall. Low lung volumes. No consolidation, evidence of pleural fluid or pneumothorax. No acute osseous abnormalities. IMPRESSION: Cardiomegaly with vascular congestion. Electronically Signed   By: Jeb Levering M.D.   On: 07/16/2016 23:52    Procedures Procedures (including critical care  time)  Medications Ordered in ED Medications  sodium chloride 0.9 % bolus 500 mL (not administered)     Initial Impression / Assessment and Plan / ED Course  I have reviewed the triage vital signs and the nursing notes.  Pertinent labs & imaging results that were available during my care of the patient were reviewed by me and considered in my medical decision making (see chart for details).     Patient presents with family for progressive weakness x 2-3 days, unable to get up from bed despite assistance. Eating less. Appears dry.   Patient remains awake but weak. She is found to have a UTI. Levaquin started, cultures pending. IVF's provided.  Ammonia significantly elevated to 120, c/w signs of confusion. VSS  Will have the patient [facility] to [facility]. Dr. Alcario Drought accepting.  Final Clinical Impressions(s) / ED Diagnoses   Final diagnoses:  None   1. Hepatic encephalopathy 2. UTI  New Prescriptions New Prescriptions   No medications on file     Dennie Bible 07/17/16 0129    Carmin Muskrat, MD 07/17/16 2124

## 2016-07-16 NOTE — ED Notes (Signed)
ED Provider at bedside. 

## 2016-07-17 DIAGNOSIS — N3000 Acute cystitis without hematuria: Secondary | ICD-10-CM

## 2016-07-17 DIAGNOSIS — F411 Generalized anxiety disorder: Secondary | ICD-10-CM | POA: Diagnosis not present

## 2016-07-17 DIAGNOSIS — Z515 Encounter for palliative care: Secondary | ICD-10-CM | POA: Diagnosis not present

## 2016-07-17 DIAGNOSIS — K7460 Unspecified cirrhosis of liver: Secondary | ICD-10-CM

## 2016-07-17 DIAGNOSIS — K7200 Acute and subacute hepatic failure without coma: Secondary | ICD-10-CM | POA: Diagnosis not present

## 2016-07-17 DIAGNOSIS — K7290 Hepatic failure, unspecified without coma: Secondary | ICD-10-CM | POA: Diagnosis not present

## 2016-07-17 DIAGNOSIS — Z66 Do not resuscitate: Secondary | ICD-10-CM | POA: Diagnosis not present

## 2016-07-17 DIAGNOSIS — D696 Thrombocytopenia, unspecified: Secondary | ICD-10-CM | POA: Diagnosis not present

## 2016-07-17 DIAGNOSIS — K7682 Hepatic encephalopathy: Secondary | ICD-10-CM | POA: Diagnosis present

## 2016-07-17 DIAGNOSIS — N390 Urinary tract infection, site not specified: Secondary | ICD-10-CM | POA: Diagnosis present

## 2016-07-17 LAB — PROTIME-INR
INR: 2.06
Prothrombin Time: 23.5 seconds — ABNORMAL HIGH (ref 11.4–15.2)

## 2016-07-17 LAB — URINALYSIS, ROUTINE W REFLEX MICROSCOPIC
BILIRUBIN URINE: NEGATIVE
GLUCOSE, UA: NEGATIVE mg/dL
KETONES UR: NEGATIVE mg/dL
Nitrite: NEGATIVE
Protein, ur: NEGATIVE mg/dL
Specific Gravity, Urine: 1.014 (ref 1.005–1.030)
pH: 6 (ref 5.0–8.0)

## 2016-07-17 LAB — AMMONIA: AMMONIA: 120 umol/L — AB (ref 9–35)

## 2016-07-17 LAB — LACTIC ACID, PLASMA: Lactic Acid, Venous: 1.4 mmol/L (ref 0.5–1.9)

## 2016-07-17 MED ORDER — BUPROPION HCL 75 MG PO TABS
75.0000 mg | ORAL_TABLET | Freq: Two times a day (BID) | ORAL | Status: DC
Start: 2016-07-17 — End: 2016-07-17

## 2016-07-17 MED ORDER — LEVOFLOXACIN IN D5W 500 MG/100ML IV SOLN
500.0000 mg | Freq: Once | INTRAVENOUS | Status: AC
Start: 2016-07-17 — End: 2016-07-17
  Administered 2016-07-17: 500 mg via INTRAVENOUS
  Filled 2016-07-17: qty 100

## 2016-07-17 MED ORDER — LORAZEPAM 2 MG/ML PO CONC
2.0000 mg | ORAL | Status: DC | PRN
  Administered 2016-07-17: 2 mg via ORAL
  Filled 2016-07-17: qty 1

## 2016-07-17 MED ORDER — LORAZEPAM 1 MG PO TABS: 1.0000 mg | ORAL_TABLET | Freq: Four times a day (QID) | ORAL | Status: DC | PRN

## 2016-07-17 MED ORDER — OXYCODONE HCL 5 MG PO TABS
10.0000 mg | ORAL_TABLET | Freq: Once | ORAL | Status: AC
  Administered 2016-07-17: 10 mg via ORAL
  Filled 2016-07-17: qty 2

## 2016-07-17 MED ORDER — RIFAXIMIN 550 MG PO TABS: 550.0000 mg | ORAL_TABLET | Freq: Two times a day (BID) | ORAL | Status: DC

## 2016-07-17 MED ORDER — POTASSIUM CHLORIDE CRYS ER 20 MEQ PO TBCR: 20.0000 meq | EXTENDED_RELEASE_TABLET | Freq: Two times a day (BID) | ORAL | Status: AC

## 2016-07-17 MED ORDER — POTASSIUM CHLORIDE CRYS ER 20 MEQ PO TBCR: 20.0000 meq | EXTENDED_RELEASE_TABLET | Freq: Once | ORAL | Status: DC

## 2016-07-17 MED ORDER — BUPROPION HCL 75 MG PO TABS
150.0000 mg | ORAL_TABLET | Freq: Every day | ORAL | Status: DC
  Filled 2016-07-17: qty 2

## 2016-07-17 MED ORDER — ROPINIROLE HCL 1 MG PO TABS: 2.0000 mg | ORAL_TABLET | Freq: Every day | ORAL | Status: DC

## 2016-07-17 MED ORDER — LORAZEPAM 2 MG/ML PO CONC
1.0000 mg | ORAL | Status: DC | PRN
Start: 2016-07-17 — End: 2016-07-18
  Administered 2016-07-17 – 2016-07-18 (×3): 1 mg via ORAL
  Filled 2016-07-17 (×3): qty 1

## 2016-07-17 MED ORDER — VITAMIN D 1000 UNITS PO TABS: 5000.0000 [IU] | ORAL_TABLET | ORAL | Status: DC

## 2016-07-17 MED ORDER — LEVOFLOXACIN IN D5W 500 MG/100ML IV SOLN
500.0000 mg | INTRAVENOUS | Status: DC
  Administered 2016-07-17: 500 mg via INTRAVENOUS
  Filled 2016-07-17: qty 100

## 2016-07-17 MED ORDER — PANTOPRAZOLE SODIUM 40 MG PO TBEC
40.0000 mg | DELAYED_RELEASE_TABLET | Freq: Every day | ORAL | Status: DC
Start: 2016-07-17 — End: 2016-07-18

## 2016-07-17 MED ORDER — PRO-STAT SUGAR FREE PO LIQD: 30.0000 mL | Freq: Two times a day (BID) | ORAL | Status: DC

## 2016-07-17 MED ORDER — OXYCODONE HCL 5 MG PO TABS
5.0000 mg | ORAL_TABLET | Freq: Four times a day (QID) | ORAL | Status: DC | PRN
  Administered 2016-07-17: 5 mg via ORAL
  Filled 2016-07-17: qty 1

## 2016-07-17 MED ORDER — TRAZODONE HCL 50 MG PO TABS: 50.0000 mg | ORAL_TABLET | Freq: Every day | ORAL | Status: DC

## 2016-07-17 MED ORDER — LACTULOSE 10 GM/15ML PO SOLN: 30.0000 g | Freq: Three times a day (TID) | ORAL | Status: DC

## 2016-07-17 MED ORDER — ACETAMINOPHEN 325 MG PO TABS: 325.0000 mg | ORAL_TABLET | Freq: Four times a day (QID) | ORAL | Status: DC | PRN

## 2016-07-17 MED ORDER — BUPROPION HCL 75 MG PO TABS
75.0000 mg | ORAL_TABLET | Freq: Every day | ORAL | Status: DC
  Filled 2016-07-17 (×2): qty 1

## 2016-07-17 MED ORDER — GABAPENTIN 300 MG PO CAPS: 300.0000 mg | ORAL_CAPSULE | Freq: Every day | ORAL | Status: DC

## 2016-07-17 MED ORDER — MORPHINE SULFATE (CONCENTRATE) 10 MG/0.5ML PO SOLN
5.0000 mg | ORAL | Status: DC | PRN
  Administered 2016-07-17 – 2016-07-18 (×4): 5 mg via ORAL
  Filled 2016-07-17 (×4): qty 0.5

## 2016-07-17 NOTE — H&P (Signed)
History and Physical    [name] BTD:[id number] DOB: 01/23/1946 DOA: 07/16/2016  PCP: Reynold Bowen, MD  Patient coming from: Home  I have personally briefly reviewed patient's old medical records in [place]  Chief Complaint: AMS  HPI: [name] is a 71 y.o. female with medical history significant of Cirrhosis, HTN, ESLD with discussions with hospice ongoing recently.  Patient presents to the ED with progressively worsening generalized weakness for the past 2-3 days.  Patient has gotten to point where she cannot get out of bed despite assistance of boyfriend.  No vomiting, SOB, cough.   ED Course: Ammonia 120, has UTI.   Review of Systems: Not able to perform due to AMS.  Past Medical History:  Diagnosis Date  . Anxiety   . Asthma   . Blood dyscrasia    low platelets ... low wbc.  ? liver  disease.   . Cancer [facility]) 2005   left breast  . Chronic kidney disease 50's   nephritis--none at present *12/03/2011)  . Cirrhosis ([place]) 2.5 yrs   never a drinker.  platelets  low... wbc also low per pt ..   . Complication of anesthesia    had difficulty going to sleep, staying asleep during surgery and was combative after procedure.  . Depression   . Diabetes mellitus    no meds  . Family history of anesthesia complication    Daughter has complications with Versed  . H/O hiatal hernia   . Headache(784.0)   . Heart murmur   . Hypertension   . Platelets decreased ([place])    ??   D/T CIRRHOISIS  . Portal hypertension ([place])   . PSVT (paroxysmal supraventricular tachycardia) ([place])   . Restless leg   . Shortness of breath    with exertion   . Sleep apnea 2013   sleep study at [facility]    uses cpap    Past Surgical History:  Procedure Laterality Date  . ABDOMINAL HYSTERECTOMY    . APPENDECTOMY    . BREAST SURGERY     left lumpectomy--few nodes removed  . CARDIAC CATHETERIZATION  1975   normal cath  -low potassium  . CHOLECYSTECTOMY    . COLONOSCOPY W/  BIOPSIES AND POLYPECTOMY    . COLONOSCOPY WITH PROPOFOL N/A 10/21/2013   Procedure: COLONOSCOPY WITH PROPOFOL;  Surgeon: [name], MD;  Location: [facility] ENDOSCOPY;  Service: Endoscopy;  Laterality: N/A;  ultra slim scope   . EP IMPLANTABLE DEVICE N/A 04/05/2015   Procedure: Loop Recorder Insertion;  Surgeon: Sanda Klein, MD;  Location: [place] CV LAB;  Service: Cardiovascular;  Laterality: N/A;  . ESOPHAGOGASTRODUODENOSCOPY (EGD) WITH PROPOFOL N/A 10/21/2013   Procedure: ESOPHAGOGASTRODUODENOSCOPY (EGD) WITH PROPOFOL;  Surgeon: [name], MD;  Location: [facility] ENDOSCOPY;  Service: Endoscopy;  Laterality: N/A;  . IR GENERIC HISTORICAL  11/08/2015   IR FLUORO GUIDE CV LINE RIGHT 11/08/2015 [facility]-INTERV RAD  . IR GENERIC HISTORICAL  11/08/2015   IR US GUIDE VASC ACCESS RIGHT 11/08/2015 [facility]-INTERV RAD  . MASTECTOMY    . NM MYOCAR PERF WALL MOTION  08/17/2008   mild to mod. superimposed ischemiamid antereoseptal,apical anterior & apical regions  . ORIF HUMERUS FRACTURE  06/19/2011   Procedure: OPEN REDUCTION INTERNAL FIXATION (ORIF) DISTAL HUMERUS FRACTURE;  Surgeon: [name], MD;  Location: [place];  Service: Orthopedics;  Laterality: Left;  . ORIF HUMERUS FRACTURE  12/11/2011   Procedure: OPEN REDUCTION INTERNAL FIXATION (ORIF) HUMERAL SHAFT FRACTURE;  Surgeon:  [name], MD;  Location: [place];  Service: Orthopedics;  Laterality: Left;   Removal of Hardware left humerus, Open Reduction Internal Fixation Left Humeral Shaft Fracture Non-Union  . TONSILLECTOMY    . US ECHOCARDIOGRAPHY  03/16/2011   mild concentric LVH,mod LAE,stage I diastolic dysfunction,MAC,mild MR,aortic sclerosis,mild TR     reports that she has never smoked. She has never used smokeless tobacco. She reports that she does not drink alcohol or use drugs.  Allergies  Allergen Reactions  . Ondansetron Hcl Other (See Comments)    Other Reaction: High B/P  . Penicillins Anaphylaxis, Itching and Swelling    Has  patient had a PCN reaction causing immediate rash, facial/tongue/throat swelling, SOB or lightheadedness with hypotension: Yes Has patient had a PCN reaction causing severe rash involving mucus membranes or skin necrosis: No Has patient had a PCN reaction that required hospitalization No Has patient had a PCN reaction occurring within the last 10 years: No If all of the above answers are "NO", then may proceed with Cephalosporin use.   . Aspirin     Has liver disease  . Midazolam Hcl Other (See Comments)    Daughter has allergy, doctors advised to warn against using.Marland KitchenMarland KitchenPropofol is okay to use.  . Niacin And Related Itching    Feels like skin is on fire    Family History  Problem Relation Age of Onset  . Stroke Father   . Anesthesia problems Daughter   . Hyperlipidemia Brother      Prior to Admission medications   Medication Sig Start Date End Date Taking? Authorizing Provider  acetaminophen (TYLENOL) 325 MG tablet Take 325 mg by mouth every 6 (six) hours as needed for mild pain.    [provider]  Amino Acids-Protein Hydrolys (FEEDING SUPPLEMENT, PRO-STAT SUGAR FREE 64,) LIQD Take 30 mLs by mouth 2 (two) times daily.    [provider]  antiseptic oral rinse (BIOTENE) LIQD 15 mLs by Mouth Rinse route 2 (two) times daily as needed for dry mouth.    [provider]  buPROPion (WELLBUTRIN) 75 MG tablet Take 1 tablet (49m) in the morning, then take 2 tablets (1567m at bedtime    [provider]  Cholecalciferol (VITAMIN D3) 5000 units TABS Take 1 tablet by mouth 2 (two) times a week.    [provider]  diphenhydrAMINE (BENADRYL) 25 mg capsule Take 1 capsule (25 mg total) by mouth every 6 (six) hours as needed for itching, allergies or sleep. 11/12/15   VaGeradine GirtDO  furosemide (LASIX) 40 MG tablet Take by mouth daily. Take 1 (4056mtab in the morning and half tab (19m38mn the evening    [provider]  gabapentin (NEURONTIN)  300 MG capsule Take 1 capsule (300 mg total) by mouth at bedtime. 11/13/15   VannGeradine Girt  lactulose (CHRONULAC) 10 GM/15ML solution Take 45 g by mouth daily as needed for mild constipation.     [provider]  nystatin (MYCOSTATIN/NYSTOP) powder Apply powder to abdominal fold daily for redness    [provider]  oxyCODONE (OXY IR/ROXICODONE) 5 MG immediate release tablet Take 1 tablet (5 mg total) by mouth every 4 (four) hours as needed for severe pain. Patient taking differently: Take 5 mg by mouth every 6 (six) hours as needed for severe pain.  11/12/15   VannGeradine Girt  potassium chloride SA (K-DUR,KLOR-CON) 20 MEQ tablet Take 1 tablet (20 mEq total) by mouth 2 (two)  times daily. 06/29/14   Molpus, John, MD  promethazine (PHENERGAN) 25 MG tablet Take 25 mg by mouth every 6 (six) hours as needed for nausea or vomiting.    [provider]  rifaximin (XIFAXAN) 550 MG TABS tablet Take 1 tablet (550 mg total) by mouth 2 (two) times daily. 11/12/15   Geradine Girt, DO  rOPINIRole (REQUIP) 1 MG tablet Take 2 mg by mouth at bedtime.     [provider]  spironolactone (ALDACTONE) 100 MG tablet Take 100 mg by mouth daily. 06/30/13   [provider]  traZODone (DESYREL) 50 MG tablet Take 50 mg by mouth at bedtime.    [provider]    Physical Exam: Vitals:   07/16/16 2300 07/17/16 0000 07/17/16 0100 07/17/16 0200  BP: (!) 131/53 (!) 142/55 (!) 103/54 (!) 123/50  Pulse: 72 75 72 74  Resp: 16 17 10 11   Temp:      TempSrc:      SpO2: 96% 95% 95% 94%  Weight:      Height:        Constitutional: NAD, calm, comfortable, sleepy Eyes: PERRL, lids and conjunctivae normal ENMT: Mucous membranes are moist. Posterior pharynx clear of any exudate or lesions.Normal dentition.  Neck: normal, supple, no masses, no thyromegaly Respiratory: clear to auscultation bilaterally, no wheezing, no crackles. Normal respiratory effort. No accessory muscle  use.  Cardiovascular: Regular rate and rhythm, no murmurs / rubs / gallops. No extremity edema. 2+ pedal pulses. No carotid bruits.  Abdomen: no tenderness, no masses palpated. No hepatosplenomegaly. Bowel sounds positive.  Musculoskeletal: no clubbing / cyanosis. No joint deformity upper and lower extremities. Good ROM, no contractures. Normal muscle tone.  Skin: no rashes, lesions, ulcers. No induration Neurologic: CN 2-12 grossly intact. Sensation intact, DTR normal. Strength 5/5 in all 4.  Psychiatric: Some confusion, slow to answer questions.   Labs on Admission: I have personally reviewed following labs and imaging studies  CBC:  Recent Labs Lab 07/16/16 2319  WBC 3.6*  NEUTROABS 2.6  HGB 12.6  HCT 37.5  MCV 102.7*  PLT 37*   Basic Metabolic Panel:  Recent Labs Lab 07/16/16 2200  NA 141  K 3.3*  CL 104  CO2 31  GLUCOSE 117*  BUN 20  CREATININE 1.31*  CALCIUM 8.4*   GFR: Estimated Creatinine Clearance: 48.4 mL/min (A) (by C-G formula based on SCr of 1.31 mg/dL (H)). Liver Function Tests:  Recent Labs Lab 07/16/16 2319  AST 74*  ALT 33  ALKPHOS 287*  BILITOT 5.0*  PROT 5.7*  ALBUMIN 2.4*   No results for input(s): LIPASE, AMYLASE in the last 168 hours.  Recent Labs Lab 07/16/16 2319  AMMONIA 120*   Coagulation Profile:  Recent Labs Lab 07/16/16 2319  INR 2.06   Cardiac Enzymes: No results for input(s): CKTOTAL, CKMB, CKMBINDEX, TROPONINI in the last 168 hours. BNP (last 3 results) No results for input(s): PROBNP in the last 8760 hours. HbA1C: No results for input(s): HGBA1C in the last 72 hours. CBG:  Recent Labs Lab 07/16/16 2322  GLUCAP 87   Lipid Profile: No results for input(s): CHOL, HDL, LDLCALC, TRIG, CHOLHDL, LDLDIRECT in the last 72 hours. Thyroid Function Tests: No results for input(s): TSH, T4TOTAL, FREET4, T3FREE, THYROIDAB in the last 72 hours. Anemia Panel: No results for input(s): VITAMINB12, FOLATE, FERRITIN, TIBC,  IRON, RETICCTPCT in the last 72 hours. Urine analysis:    Component Value Date/Time   COLORURINE AMBER (A) 07/17/2016 0003  APPEARANCEUR CLEAR 07/17/2016 0003   LABSPEC 1.014 07/17/2016 0003   PHURINE 6.0 07/17/2016 0003   GLUCOSEU NEGATIVE 07/17/2016 0003   HGBUR SMALL (A) 07/17/2016 0003   BILIRUBINUR NEGATIVE 07/17/2016 0003   KETONESUR NEGATIVE 07/17/2016 0003   PROTEINUR NEGATIVE 07/17/2016 0003   UROBILINOGEN 1.0 04/18/2012 1030   NITRITE NEGATIVE 07/17/2016 0003   LEUKOCYTESUR LARGE (A) 07/17/2016 0003    Radiological Exams on Admission: Dg Chest 1 View  Result Date: 07/16/2016 CLINICAL DATA:  Progressive weakness. EXAM: CHEST 1 VIEW COMPARISON:  Chest radiograph 11/07/2015 FINDINGS: Stable cardiomegaly and mediastinal contours. Vascular congestion without overt pulmonary edema. Loop recorder projects over the left chest wall. Low lung volumes. No consolidation, evidence of pleural fluid or pneumothorax. No acute osseous abnormalities. IMPRESSION: Cardiomegaly with vascular congestion. Electronically Signed   By: Jeb Levering M.D.   On: 07/16/2016 23:52    EKG: Independently reviewed.  Assessment/Plan Principal Problem:   Acute hepatic encephalopathy Active Problems:   Thrombocytopenia, unspecified (HCC)   Cirrhosis, non-alcoholic ([place])   Acute lower UTI    1. Acute hepatic encephalopathy - 1. Resume home Rifaxin 2. Lactulose 30g TID ordered 3. Holding Lasix and spironolactone to "gently hydrate" patient 2. Cirrhosis -  1. Chronic, end stage, and patient currently in discussions with hospice 3. Thrombocytopenia - chronic and stable 4. UTI - Levaquin (anaphylaxis to PCN).  DVT prophylaxis: SCDs due to chronic thrombycytopenia Code Status: Full code for now Family Communication: Family at bedside Disposition Plan: Home if improved Consults called: None Admission status: Admit to inpatient   [place], [place] [facility] Pager  [phone]  If 7AM-7PM, please contact day team taking care of patient [email or web address] Password [facility]  07/17/2016, 2:38 AM

## 2016-07-17 NOTE — Progress Notes (Signed)
Pharmacy Antibiotic Note  [name] is a 71 y.o. female admitted on 07/16/2016 with hepatic encephalopathy and UTI.  Pharmacy has been consulted for Levaquin dosing.  Plan: Levaquin 500 mg IV q24h  Height: 5\' 8"  (172.7 cm) Weight: 200 lb (90.7 kg) IBW/kg (Calculated) : 63.9  Temp (24hrs), Avg:98.5 F (36.9 C), Min:98.1 F (36.7 C), Max:98.9 F (37.2 C)   Recent Labs Lab 07/16/16 2200 07/16/16 2319  WBC  --  3.6*  CREATININE 1.31*  --   LATICACIDVEN  --  1.4    Estimated Creatinine Clearance: 48.4 mL/min (A) (by C-G formula based on SCr of 1.31 mg/dL (H)).    Allergies  Allergen Reactions  . Ondansetron Hcl Other (See Comments)    Other Reaction: High B/P  . Penicillins Anaphylaxis, Itching and Swelling    Has patient had a PCN reaction causing immediate rash, facial/tongue/throat swelling, SOB or lightheadedness with hypotension: Yes Has patient had a PCN reaction causing severe rash involving mucus membranes or skin necrosis: No Has patient had a PCN reaction that required hospitalization No Has patient had a PCN reaction occurring within the last 10 years: No If all of the above answers are "NO", then may proceed with Cephalosporin use.   . Aspirin     Has liver disease  . Midazolam Hcl Other (See Comments)    Daughter has allergy, doctors advised to warn against using.Marland KitchenMarland KitchenPropofol is okay to use.  . Niacin And Related Itching    Feels like skin is on fire     Caryl Pina 07/17/2016 4:21 AM

## 2016-07-17 NOTE — Progress Notes (Signed)
Removed fentanyl patch from right chest. No orders for it. Joslyn Hy, MSN, RN, [employer]

## 2016-07-17 NOTE — Progress Notes (Signed)
Patient arrived to floor via stretcher. Family at bedside. Verified armband, placed cardiac monitor #6 and verified.Patient has generalized edema over entire body.Has splotches and ecchymotic areas generalized.  Patient Given pain medication.Patient is high fall risk, yellow socks and band on.  Left arm restricted due to mastectomy.Bed alarm, siderails up X 2. Safety maintained.

## 2016-07-17 NOTE — Progress Notes (Signed)
Patient's family requested increased pain medication and patient to be made DNR.  Will text [name], [name] admitted this am. Requesting stronger pain mgmt and also family has stated to make patient DNR. End Stage Cirrhosis, hospice  Awaiting call back

## 2016-07-17 NOTE — ED Notes (Signed)
Admitting MD at bedside.

## 2016-07-17 NOTE — Progress Notes (Signed)
Palliative Medicine RN Note: Consult order noted.   [place] and [employer]. Per HOP, the pt has been under the care of [facility] but has been discussing a change in hospice provider; pt has NOT transferred to [facility]. Left message for Bambi with [facility] to clarify.  Marjie Skiff Mourad Cwikla, RN, BSN, [facility] 07/17/2016 9:20 AM Cell [phone] 8:00-4:00 Monday-Friday Office [phone]

## 2016-07-17 NOTE — Progress Notes (Signed)
Patient seen and examined   71 y.o. female with medical history significant of Cirrhosis, HTN, ESLD with discussions with hospice ongoing recently.  Patient presents to the ED with progressively worsening generalized weakness for the past 2-3 days.  Patient has gotten to point where she cannot get out of bed despite assistance of boyfriend.     Assessment and plan 1. Acute hepatic encephalopathy - 1. Resume home Rifaxin, continue lactulose, follow ammonia level 2. Holding Lasix and spironolactone to "gently hydrate" patient 2. Cirrhosis - Chronic, end stage, and patient currently in discussions with hospice-requesting DO NOT RESUSCITATE and palliative consult 3. Thrombocytopenia - chronic and stable 4. UTI - Levaquin (anaphylaxis to PCN). Follow urine culture  Discussed with daughter and brother , no further labs , continue meds as tolerated , dc tele , confirmed DNR Looking at residential hospice

## 2016-07-17 NOTE — Consult Note (Signed)
Consultation Note Date: 07/17/2016   Patient Name: [name]  DOB: 1946/01/14  MRN: [id number]  Age / Sex: 71 y.o., female  PCP: Reynold Bowen, MD Referring Physician: Reyne Dumas, MD  Reason for Consultation: Establishing goals of care and Psychosocial/spiritual support  HPI/Patient Profile: 71 y.o. female  admitted on 07/16/2016 with a past  medical history significant of Cirrhosis, HTN, ESLD.    She is currently under [facility],  for the past month.   Patient presents to the ED with progressively worsening generalized weakness for the past 2-3 days.  Patient has gotten to point where she cannot get out of bed despite assistance of boyfriend. She is with  intermittent confusion. Her amonia  level was 120 and she has a UTI.  Per her family she had had continued physical, functional and cognitive decline over the past 3 months.  They were under the impression that she was going to Hospice facility in [facility] today.  I explained that was not the case, and further detailed the process and eligibility to access a bed at a hospice facility.  They have not even established GOCs at this time.  Family face advanced directive decisions and anticipatory care needs  Clinical Assessment and Goals of Care:   This NP Wadie Lessen reviewed medical records, received report from team, assessed the patient and then meet at the patient's bedside along with her son/Paul and SO/Larry  to discuss diagnosis, prognosis, GOC, EOL wishes disposition and options.  A detailed discussion was had today regarding advanced directives.  Concepts specific to code status, artifical feeding and hydration, continued IV antibiotics and rehospitalization was had.  The difference between a aggressive medical intervention path  and a palliative comfort care path for this patient at this time was had.  Values and goals of  care important to patient and family were attempted to be elicited.  Concept of Hospice and Palliative Care were discussed.  Family had many misunderstanding of the hospice benefit and eligibility criteria for a hospice facility.  I think the SO/main caregiver is struggling at home and frantically searching for care options.  Natural trajectory and expectations at EOL were discussed.  Questions and concerns addressed.   Family encouraged to call with questions or concerns.  PMT will continue to support holistically.  We will re-meet in the  morning at 1000 to further clarify GOCs, son needs to discuss with his sister tonight   NEXT OF KIN/ two children are main decision makers    SUMMARY OF RECOMMENDATIONS    Family is leaning toward a full comfort path.  They need to discuss today's conversation as a family and will make decisions when we meet in the morning at 1000  Code Status/Advance Care Planning:  DNR   Symptom Management:   Pain: Roxanol 5 mg po/sl every 3 hrs prn  Anxiety: Ativan 1 mg po/sl every 4 hrs prn  Palliative Prophylaxis:    Aspiration, Delirium Protocol, Frequent Pain Assessment and Oral Care   Psycho-social/Spiritual:  Desire for further Chaplaincy support:no  Additional Recommendations: Education on Hospice  Prognosis:    < 6 months- will be affected by family's decsion to shift and desire for life prolonging measures  Discharge Planning: To Be Determined      Primary Diagnoses: Present on Admission: . Acute hepatic encephalopathy . Cirrhosis, non-alcoholic ([place]) . Acute lower UTI . Thrombocytopenia, unspecified ([place])   I have reviewed the medical record, interviewed the patient and family, and examined the patient. The following aspects are pertinent.  Past Medical History:  Diagnosis Date  . Anxiety   . Asthma   . Blood dyscrasia    low platelets ... low wbc.  ? liver  disease.   . Cancer [facility]) 2005   left breast  . Chronic  kidney disease 50's   nephritis--none at present *12/03/2011)  . Cirrhosis ([place]) 2.5 yrs   never a drinker.  platelets  low... wbc also low per pt ..   . Complication of anesthesia    had difficulty going to sleep, staying asleep during surgery and was combative after procedure.  . Depression   . Diabetes mellitus    no meds  . Family history of anesthesia complication    Daughter has complications with Versed  . H/O hiatal hernia   . Headache(784.0)   . Heart murmur   . Hypertension   . Platelets decreased ([place])    ??   D/T CIRRHOISIS  . Portal hypertension ([place])   . PSVT (paroxysmal supraventricular tachycardia) ([place])   . Restless leg   . Shortness of breath    with exertion   . Sleep apnea 2013   sleep study at [facility]    uses cpap   Social History   Social History  . Marital status: Widowed    Spouse name: N/A  . Number of children: N/A  . Years of education: N/A   Social History Main Topics  . Smoking status: Never Smoker  . Smokeless tobacco: Never Used  . Alcohol use No  . Drug use: No  . Sexual activity: No   Other Topics Concern  . None   Social History Narrative  . None   Family History  Problem Relation Age of Onset  . Stroke Father   . Anesthesia problems Daughter   . Hyperlipidemia Brother    Scheduled Meds: . buPROPion  75 mg Oral Daily   And  . buPROPion  150 mg Oral QHS  . [START ON 07/19/2016] cholecalciferol  5,000 Units Oral Once per day on Mon Thu  . feeding supplement (PRO-STAT SUGAR FREE 64)  30 mL Oral BID  . gabapentin  300 mg Oral QHS  . lactulose  30 g Oral TID  . pantoprazole  40 mg Oral Daily  . potassium chloride  20 mEq Oral BID  . rifaximin  550 mg Oral BID  . rOPINIRole  2 mg Oral QHS  . traZODone  50 mg Oral QHS   Continuous Infusions: . levofloxacin (LEVAQUIN) IV     PRN Meds:.acetaminophen, LORazepam, morphine CONCENTRATE, oxyCODONE Medications Prior to Admission:  Prior to Admission medications   Medication Sig  Start Date End Date Taking? Authorizing Provider  acetaminophen (TYLENOL) 325 MG tablet Take 325 mg by mouth every 6 (six) hours as needed for mild pain.    [provider]  Amino Acids-Protein Hydrolys (FEEDING SUPPLEMENT, PRO-STAT SUGAR FREE 64,) LIQD Take 30 mLs by mouth 2 (two) times daily.    [provider]  antiseptic oral  rinse (BIOTENE) LIQD 15 mLs by Mouth Rinse route 2 (two) times daily as needed for dry mouth.    [provider]  buPROPion (WELLBUTRIN) 75 MG tablet Take 1 tablet (75mg ) in the morning, then take 2 tablets (150mg ) at bedtime    [provider]  Cholecalciferol (VITAMIN D3) 5000 units TABS Take 1 tablet by mouth 2 (two) times a week.    [provider]  diphenhydrAMINE (BENADRYL) 25 mg capsule Take 1 capsule (25 mg total) by mouth every 6 (six) hours as needed for itching, allergies or sleep. 11/12/15   Geradine Girt, DO  furosemide (LASIX) 40 MG tablet Take by mouth daily. Take 1 (40mg ) tab in the morning and half tab (20mg ) in the evening    [provider]  gabapentin (NEURONTIN) 300 MG capsule Take 1 capsule (300 mg total) by mouth at bedtime. 11/13/15   Geradine Girt, DO  lactulose (CHRONULAC) 10 GM/15ML solution Take 45 g by mouth daily as needed for mild constipation.     [provider]  nystatin (MYCOSTATIN/NYSTOP) powder Apply powder to abdominal fold daily for redness    [provider]  oxyCODONE (OXY IR/ROXICODONE) 5 MG immediate release tablet Take 1 tablet (5 mg total) by mouth every 4 (four) hours as needed for severe pain. Patient taking differently: Take 5 mg by mouth every 6 (six) hours as needed for severe pain.  11/12/15   Geradine Girt, DO  potassium chloride SA (K-DUR,KLOR-CON) 20 MEQ tablet Take 1 tablet (20 mEq total) by mouth 2 (two) times daily. 06/29/14   Molpus, John, MD  promethazine (PHENERGAN) 25 MG tablet Take 25 mg by mouth every 6 (six) hours as needed for nausea or  vomiting.    [provider]  rifaximin (XIFAXAN) 550 MG TABS tablet Take 1 tablet (550 mg total) by mouth 2 (two) times daily. 11/12/15   Geradine Girt, DO  rOPINIRole (REQUIP) 1 MG tablet Take 2 mg by mouth at bedtime.     [provider]  spironolactone (ALDACTONE) 100 MG tablet Take 100 mg by mouth daily. 06/30/13   [provider]  traZODone (DESYREL) 50 MG tablet Take 50 mg by mouth at bedtime.    [provider]   Allergies  Allergen Reactions  . Ondansetron Hcl Other (See Comments)    Other Reaction: High B/P  . Penicillins Anaphylaxis, Itching and Swelling    Has patient had a PCN reaction causing immediate rash, facial/tongue/throat swelling, SOB or lightheadedness with hypotension: Yes Has patient had a PCN reaction causing severe rash involving mucus membranes or skin necrosis: No Has patient had a PCN reaction that required hospitalization No Has patient had a PCN reaction occurring within the last 10 years: No If all of the above answers are "NO", then may proceed with Cephalosporin use.   . Aspirin     Has liver disease  . Midazolam Hcl Other (See Comments)    Daughter has allergy, doctors advised to warn against using.Marland KitchenMarland KitchenPropofol is okay to use.  . Niacin And Related Itching    Feels like skin is on fire   Review of Systems  Unable to perform ROS: Mental status change    Physical Exam  Constitutional: She appears well-developed. She appears ill.  Cardiovascular: Normal rate, regular rhythm and normal heart sounds.   Pulmonary/Chest: She has decreased breath sounds in the right lower field and the left lower field.  Skin: Skin is warm and dry.  Vital Signs: BP (!) 123/48 (BP Location: Right Arm)   Pulse 80   Temp 98.9 F (37.2 C)   Resp 18   Ht 5\' 8"  (1.727 m)   Wt 90.7 kg (200 lb)   SpO2 97%   BMI 30.41 kg/m  Pain Assessment: No/denies pain   Pain Score: 0-No pain   SpO2: SpO2: 97 % O2 Device:SpO2: 97 % O2 Flow  Rate: .   IO: Intake/output summary:  Intake/Output Summary (Last 24 hours) at 07/17/16 1432 Last data filed at 07/17/16 1419  Gross per 24 hour  Intake              500 ml  Output              420 ml  Net               80 ml    LBM:   Baseline Weight: Weight: 90.7 kg (200 lb) Most recent weight: Weight: 90.7 kg (200 lb)      Palliative Assessment/Data: 30 % at best   Flowsheet Rows     Most Recent Value  Intake Tab  Referral Department  Hospitalist  Unit at Time of Referral  Med/Surg Unit  Palliative Care Primary Diagnosis  Other (Comment) [cirrhosis]  Date Notified  07/17/16  Palliative Care Type  New Palliative care  Reason for referral  Clarify Goals of Care  Date of Admission  07/16/16  # of days IP prior to Palliative referral  1  Clinical Assessment  Psychosocial & Spiritual Assessment  Palliative Care Outcomes     Discussed with Dr Allyson Sabal   Time In: 1330 Time Out: 1445 Time Total: 75 min Greater than 50%  of this time was spent counseling and coordinating care related to the above assessment and plan.  Signed by: Wadie Lessen, NP   Please contact Palliative Medicine Team phone at [phone] for questions and concerns.  For individual provider: See Shea Evans

## 2016-07-18 DIAGNOSIS — Z66 Do not resuscitate: Secondary | ICD-10-CM | POA: Diagnosis not present

## 2016-07-18 DIAGNOSIS — N3000 Acute cystitis without hematuria: Secondary | ICD-10-CM | POA: Diagnosis not present

## 2016-07-18 DIAGNOSIS — Z515 Encounter for palliative care: Secondary | ICD-10-CM | POA: Diagnosis not present

## 2016-07-18 DIAGNOSIS — K7460 Unspecified cirrhosis of liver: Secondary | ICD-10-CM | POA: Diagnosis not present

## 2016-07-18 DIAGNOSIS — K7200 Acute and subacute hepatic failure without coma: Secondary | ICD-10-CM | POA: Diagnosis not present

## 2016-07-18 DIAGNOSIS — F411 Generalized anxiety disorder: Secondary | ICD-10-CM | POA: Diagnosis not present

## 2016-07-18 DIAGNOSIS — K7290 Hepatic failure, unspecified without coma: Secondary | ICD-10-CM | POA: Diagnosis not present

## 2016-07-18 LAB — BLOOD CULTURE ID PANEL (REFLEXED)
ACINETOBACTER BAUMANNII: NOT DETECTED
CANDIDA ALBICANS: NOT DETECTED
CANDIDA KRUSEI: NOT DETECTED
CANDIDA PARAPSILOSIS: NOT DETECTED
CANDIDA TROPICALIS: NOT DETECTED
Candida glabrata: NOT DETECTED
ESCHERICHIA COLI: NOT DETECTED
Enterobacter cloacae complex: NOT DETECTED
Enterobacteriaceae species: NOT DETECTED
Enterococcus species: NOT DETECTED
HAEMOPHILUS INFLUENZAE: NOT DETECTED
KLEBSIELLA OXYTOCA: NOT DETECTED
KLEBSIELLA PNEUMONIAE: NOT DETECTED
Listeria monocytogenes: NOT DETECTED
METHICILLIN RESISTANCE: DETECTED — AB
Neisseria meningitidis: NOT DETECTED
PSEUDOMONAS AERUGINOSA: NOT DETECTED
Proteus species: NOT DETECTED
SERRATIA MARCESCENS: NOT DETECTED
STAPHYLOCOCCUS AUREUS BCID: NOT DETECTED
STREPTOCOCCUS PNEUMONIAE: NOT DETECTED
STREPTOCOCCUS PYOGENES: NOT DETECTED
Staphylococcus species: DETECTED — AB
Streptococcus agalactiae: NOT DETECTED
Streptococcus species: NOT DETECTED

## 2016-07-18 LAB — CUP PACEART REMOTE DEVICE CHECK
MDC IDC PG IMPLANT DT: 20170131
MDC IDC SESS DTM: 20180427014215

## 2016-07-18 MED ORDER — WHITE PETROLATUM GEL
Status: AC
Start: 2016-07-18 — End: 2016-07-18
  Administered 2016-07-18: 13:00:00
  Filled 2016-07-18: qty 1

## 2016-07-18 MED ORDER — MORPHINE SULFATE (CONCENTRATE) 10 MG/0.5ML PO SOLN
5.0000 mg | ORAL | Status: DC | PRN
Start: 2016-07-18 — End: 2016-07-18
  Administered 2016-07-18 (×3): 5 mg via ORAL
  Filled 2016-07-18 (×3): qty 0.5

## 2016-07-18 NOTE — Progress Notes (Signed)
PHARMACY - PHYSICIAN COMMUNICATION CRITICAL VALUE ALERT - BLOOD CULTURE IDENTIFICATION (BCID)  Results for orders placed or performed during the hospital encounter of 07/16/16  Blood Culture ID Panel (Reflexed) (Collected: 07/16/2016 10:00 PM)  Result Value Ref Range   Enterococcus species NOT DETECTED NOT DETECTED   Listeria monocytogenes NOT DETECTED NOT DETECTED   Staphylococcus species DETECTED (A) NOT DETECTED   Staphylococcus aureus NOT DETECTED NOT DETECTED   Methicillin resistance DETECTED (A) NOT DETECTED   Streptococcus species NOT DETECTED NOT DETECTED   Streptococcus agalactiae NOT DETECTED NOT DETECTED   Streptococcus pneumoniae NOT DETECTED NOT DETECTED   Streptococcus pyogenes NOT DETECTED NOT DETECTED   Acinetobacter baumannii NOT DETECTED NOT DETECTED   Enterobacteriaceae species NOT DETECTED NOT DETECTED   Enterobacter cloacae complex NOT DETECTED NOT DETECTED   Escherichia coli NOT DETECTED NOT DETECTED   Klebsiella oxytoca NOT DETECTED NOT DETECTED   Klebsiella pneumoniae NOT DETECTED NOT DETECTED   Proteus species NOT DETECTED NOT DETECTED   Serratia marcescens NOT DETECTED NOT DETECTED   Haemophilus influenzae NOT DETECTED NOT DETECTED   Neisseria meningitidis NOT DETECTED NOT DETECTED   Pseudomonas aeruginosa NOT DETECTED NOT DETECTED   Candida albicans NOT DETECTED NOT DETECTED   Candida glabrata NOT DETECTED NOT DETECTED   Candida krusei NOT DETECTED NOT DETECTED   Candida parapsilosis NOT DETECTED NOT DETECTED   Candida tropicalis NOT DETECTED NOT DETECTED    Name of physician (or Provider) Contacted: [facility]  Changes to prescribed antibiotics required: none, comfort care   Vincenza Hews, PharmD, BCPS 07/18/2016, 12:23 PM Pager: [phone]

## 2016-07-18 NOTE — Progress Notes (Signed)
Patient ID: JANN MILKOVICH, female   DOB: [birth year], 71 y.o.   MRN: [id number]   This NP visited patient at the bedside as a follow up to  yesterday's [place]. Continued conversation with family ( son/Paul and daughter by telephone) regarding diagnosis, prognosis, GOCs and EOL wishes  The difference between a aggressive medical intervention path  and a palliative comfort care path for this patient at this time was had.   Family has made decision for full comfort path.  No life prolonging interventions desired. Comfort and dignity are a priority.  Discussed symptom management  --Roxanol 5 mg po/sl every 1 hr prn for pain or dyspnea --Ativan 1 mg po/sl every 4 hrs prn for anxiety Place foley cath for comfort  [facility] is for [facility] [place].  Will write for choice, prognosis is likely less than 2 weeks; no life prolonging measures, stop antibiotics,  taking only sips/ mouth care.  Natural trajectory and expectations at EOL were discussed.  Questions and concerns addressed.   Family encouraged to call with questions or concerns.  PMT will continue to support holistically.  Time in  1000         Time out    1045  Total time 45 minutes  Greater than 50% of the time was spent in counseling and coordination of care  Wadie Lessen NP  Palliative Medicine Team Team Phone # [phone] Pager [phone]

## 2016-07-18 NOTE — Progress Notes (Signed)
[facility] is able to accept patient today.  [name] LCSWA [phone]

## 2016-07-18 NOTE — Consult Note (Signed)
[facility] [place]: Met in pt's room with daughter, son and pt's brother. Discussed the hospice home philosophy and the goals of care. They are wanting to keep there mother comfortable and focus on comfort care only. DNR in chart placed by MSW today to transport with the pt. By ambulance. Spoke to DR. Nettey and he will do the discharge paper work for pt to travel by ambulance to [facility] today. He is in agreement with her going as well. Spoke to our Market researcher and they have accepted her for end of life care. Thank you for hte opportunity to serve our community. Webb Silversmith RN [phone]

## 2016-07-18 NOTE — Care Management Obs Status (Signed)
[place] NOTIFICATION   Patient Details  Name: [name] MRN: [id number] Date of Birth: [birth year]   Medicare Observation Status Notification Given:  Yes    Sharin Mons, RN 07/18/2016, 3:46 PM

## 2016-07-18 NOTE — Progress Notes (Signed)
Nutrition Brief Note  Chart reviewed. Pt now transitioning to comfort care.  No further nutrition interventions warranted at this time.  Please re-consult as needed.   Armilda Vanderlinden A. Cabrina Shiroma, RD, LDN, CDE Pager: [phone] After hours Pager: [phone]  

## 2016-07-18 NOTE — Progress Notes (Signed)
CSW sent referral to [facility] for assessment. They will evaluate patient this afternoon.   Percell Locus Brendan Gruwell LCSWA [phone]

## 2016-07-18 NOTE — Progress Notes (Addendum)
Patient's daughter would like for patient to discharge to [place]. Palliative care team in agreement with referral. CSW will initiate the process.   Percell Locus Magalie Almon LCSWA [phone]

## 2016-07-18 NOTE — Discharge Summary (Signed)
Physician Discharge Summary  [name] WHQ:[id number] DOB: [birth year] DOA: 07/16/2016  PCP: Reynold Bowen, MD  Admit date: 07/16/2016 Discharge date: 07/18/2016  Admitted From: Home Disposition: Hospice  Recommendations for Outpatient Follow-up:  1. Hospice   Discharge Condition: Hospice CODE STATUS: DNR/DNI Diet recommendation: Comfort feeds   Brief/Interim Summary:  Admission HPI written by Etta Quill, DO   Chief Complaint: AMS  HPI: [name] is a 71 y.o. female with medical history significant of Cirrhosis, HTN, ESLD with discussions with hospice ongoing recently.  Patient presents to the ED with progressively worsening generalized weakness for the past 2-3 days.  Patient has gotten to point where she cannot get out of bed despite assistance of boyfriend.  No vomiting, SOB, cough.    Hospital course:  Acute encephalopathy Likely secondary to hepatic encephalopathy. 1/2 blood cultures positive for MRSA and urine culture positive for >100,000 colonies of unknown organism. Palliative care was consulted on admission and patient transitioned to comfort care. Levaquin discontinued. Lactulose and Rifampin discontinued.   Cirrhosis Chronic and end stage. Comfort care.  Thrombocytopenia Chronic. Stable.  UTI Initially started on Levaquin. Transitioned to comfort care so Levaquin discontinued.   Discharge Diagnoses:  Principal Problem:   Acute hepatic encephalopathy Active Problems:   Thrombocytopenia, unspecified ([place])   Cirrhosis, non-alcoholic ([place])   Acute lower UTI   Acute cystitis without hematuria   DNR (do not resuscitate)   Palliative care by specialist   Anxiety state    Discharge Instructions   Allergies as of 07/18/2016      Reactions   Ondansetron Hcl Other (See Comments)   Other Reaction: High B/P   Penicillins Anaphylaxis, Itching, Swelling   Has patient had a PCN reaction causing immediate rash, facial/tongue/throat  swelling, SOB or lightheadedness with hypotension: Yes Has patient had a PCN reaction causing severe rash involving mucus membranes or skin necrosis: No Has patient had a PCN reaction that required hospitalization No Has patient had a PCN reaction occurring within the last 10 years: No If all of the above answers are "NO", then may proceed with Cephalosporin use.   Aspirin    Has liver disease   Midazolam Hcl Other (See Comments)   Daughter has allergy, doctors advised to warn against using.Marland KitchenMarland KitchenPropofol is okay to use.   Niacin And Related Itching   Feels like skin is on fire      Medication List    STOP taking these medications   acetaminophen 325 MG tablet Commonly known as:  TYLENOL   antiseptic oral rinse Liqd   buPROPion 75 MG tablet Commonly known as:  WELLBUTRIN   diphenhydrAMINE 25 mg capsule Commonly known as:  BENADRYL   feeding supplement (PRO-STAT SUGAR FREE 64) Liqd   furosemide 40 MG tablet Commonly known as:  LASIX   gabapentin 300 MG capsule Commonly known as:  NEURONTIN   lactulose 10 GM/15ML solution Commonly known as:  CHRONULAC   nystatin powder Commonly known as:  MYCOSTATIN/NYSTOP   oxyCODONE 5 MG immediate release tablet Commonly known as:  Oxy IR/ROXICODONE   potassium chloride SA 20 MEQ tablet Commonly known as:  K-DUR,KLOR-CON   promethazine 25 MG tablet Commonly known as:  PHENERGAN   rifaximin 550 MG Tabs tablet Commonly known as:  XIFAXAN   rOPINIRole 1 MG tablet Commonly known as:  REQUIP   spironolactone 100 MG tablet Commonly known as:  ALDACTONE   traZODone 50 MG tablet Commonly known as:  DESYREL   Vitamin D3 5000  units Tabs       Allergies  Allergen Reactions  . Ondansetron Hcl Other (See Comments)    Other Reaction: High B/P  . Penicillins Anaphylaxis, Itching and Swelling    Has patient had a PCN reaction causing immediate rash, facial/tongue/throat swelling, SOB or lightheadedness with hypotension: Yes Has  patient had a PCN reaction causing severe rash involving mucus membranes or skin necrosis: No Has patient had a PCN reaction that required hospitalization No Has patient had a PCN reaction occurring within the last 10 years: No If all of the above answers are "NO", then may proceed with Cephalosporin use.   . Aspirin     Has liver disease  . Midazolam Hcl Other (See Comments)    Daughter has allergy, doctors advised to warn against using.Marland KitchenMarland KitchenPropofol is okay to use.  . Niacin And Related Itching    Feels like skin is on fire    Consultations:  Palliative care medicine   Procedures/Studies: Dg Chest 1 View  Result Date: 07/16/2016 CLINICAL DATA:  Progressive weakness. EXAM: CHEST 1 VIEW COMPARISON:  Chest radiograph 11/07/2015 FINDINGS: Stable cardiomegaly and mediastinal contours. Vascular congestion without overt pulmonary edema. Loop recorder projects over the left chest wall. Low lung volumes. No consolidation, evidence of pleural fluid or pneumothorax. No acute osseous abnormalities. IMPRESSION: Cardiomegaly with vascular congestion. Electronically Signed   By: Jeb Levering M.D.   On: 07/16/2016 23:52      Subjective: Patient reports no pain.  Discharge Exam: Vitals:   07/17/16 2153 07/18/16 0601  BP: (!) 134/46 (!) 122/50  Pulse: 82 89  Resp: 20 20  Temp: 98.8 F (37.1 C) 98.6 F (37 C)   Vitals:   07/17/16 0340 07/17/16 1458 07/17/16 2153 07/18/16 0601  BP: (!) 123/48 (!) 122/52 (!) 134/46 (!) 122/50  Pulse: 80 84 82 89  Resp: 18 17 20 20   Temp: 98.9 F (37.2 C) 98 F (36.7 C) 98.8 F (37.1 C) 98.6 F (37 C)  TempSrc:  Oral Oral   SpO2: 97% 95% 95% 97%  Weight:      Height:        General exam: Appears calm and comfortable Respiratory system: Clear to auscultation. Respiratory effort normal. Cardiovascular system: S1 & S2 heard, RRR. No murmurs. Gastrointestinal system: Abdomen is nondistended, soft and nontender. Normal bowel sounds heard. Central  nervous system: Somnolent but responds to voice commands and oriented to person only. Extremities: No edema. No calf tenderness Skin: No cyanosis. No rashes Psychiatry: Judgement and insight appear impaired. Mood & affect depressed and flat.   The results of significant diagnostics from this hospitalization (including imaging, microbiology, ancillary and laboratory) are listed below for reference.     Microbiology: Recent Results (from the past 240 hour(s))  Culture, blood (routine x 2)     Status: None (Preliminary result)   Collection Time: 07/16/16 10:00 PM  Result Value Ref Range Status   Specimen Description BLOOD RIGHT ANTECUBITAL  Final   Special Requests   Final    BOTTLES DRAWN AEROBIC AND ANAEROBIC Blood Culture adequate volume   Culture  Setup Time   Final    Organism ID to follow GRAM POSITIVE COCCI IN CLUSTERS ANAEROBIC BOTTLE ONLY CRITICAL RESULT CALLED TO, READ BACK BY AND VERIFIED WITH: AElta [place].D. 12:20 07/18/16 ([email or web address])    Culture NO GROWTH 1 DAY  Final   Report Status PENDING  Incomplete  Blood Culture ID Panel (Reflexed)     Status: Abnormal   Collection  Time: 07/16/16 10:00 PM  Result Value Ref Range Status   Enterococcus species NOT DETECTED NOT DETECTED Final   Listeria monocytogenes NOT DETECTED NOT DETECTED Final   Staphylococcus species DETECTED (A) NOT DETECTED Final    Comment: Methicillin (oxacillin) resistant coagulase negative staphylococcus. Possible blood culture contaminant (unless isolated from more than one blood culture draw or clinical case suggests pathogenicity). No antibiotic treatment is indicated for blood  culture contaminants. CRITICAL RESULT CALLED TO, READ BACK BY AND VERIFIED WITH: AElta [place].D. 12:20 07/18/16    Staphylococcus aureus NOT DETECTED NOT DETECTED Final   Methicillin resistance DETECTED (A) NOT DETECTED Final    Comment: CRITICAL RESULT CALLED TO, READ BACK BY AND VERIFIED WITH: AElta [place].D.  12:20 07/18/16 ([email or web address])    Streptococcus species NOT DETECTED NOT DETECTED Final   Streptococcus agalactiae NOT DETECTED NOT DETECTED Final   Streptococcus pneumoniae NOT DETECTED NOT DETECTED Final   Streptococcus pyogenes NOT DETECTED NOT DETECTED Final   Acinetobacter baumannii NOT DETECTED NOT DETECTED Final   Enterobacteriaceae species NOT DETECTED NOT DETECTED Final   Enterobacter cloacae complex NOT DETECTED NOT DETECTED Final   Escherichia coli NOT DETECTED NOT DETECTED Final   Klebsiella oxytoca NOT DETECTED NOT DETECTED Final   Klebsiella pneumoniae NOT DETECTED NOT DETECTED Final   Proteus species NOT DETECTED NOT DETECTED Final   Serratia marcescens NOT DETECTED NOT DETECTED Final   Haemophilus influenzae NOT DETECTED NOT DETECTED Final   Neisseria meningitidis NOT DETECTED NOT DETECTED Final   Pseudomonas aeruginosa NOT DETECTED NOT DETECTED Final   Candida albicans NOT DETECTED NOT DETECTED Final   Candida glabrata NOT DETECTED NOT DETECTED Final   Candida krusei NOT DETECTED NOT DETECTED Final   Candida parapsilosis NOT DETECTED NOT DETECTED Final   Candida tropicalis NOT DETECTED NOT DETECTED Final  Culture, blood (routine x 2)     Status: None (Preliminary result)   Collection Time: 07/16/16 11:19 PM  Result Value Ref Range Status   Specimen Description BLOOD RIGHT WRIST  Final   Special Requests   Final    BOTTLES DRAWN AEROBIC ONLY Blood Culture adequate volume   Culture NO GROWTH 1 DAY  Final   Report Status PENDING  Incomplete  Urine culture     Status: Abnormal (Preliminary result)   Collection Time: 07/17/16 12:03 AM  Result Value Ref Range Status   Specimen Description URINE, CATHETERIZED  Final   Special Requests NONE  Final   Culture (A)  Final    >=100,000 COLONIES/mL UNIDENTIFIED ORGANISM IDENTIFICATION TO FOLLOW    Report Status PENDING  Incomplete     Labs: BNP (last 3 results) No results for input(s): BNP in the last 8760 hours. Basic  Metabolic Panel:  Recent Labs Lab 07/16/16 2200  NA 141  K 3.3*  CL 104  CO2 31  GLUCOSE 117*  BUN 20  CREATININE 1.31*  CALCIUM 8.4*   Liver Function Tests:  Recent Labs Lab 07/16/16 2319  AST 74*  ALT 33  ALKPHOS 287*  BILITOT 5.0*  PROT 5.7*  ALBUMIN 2.4*   No results for input(s): LIPASE, AMYLASE in the last 168 hours.  Recent Labs Lab 07/16/16 2319  AMMONIA 120*   CBC:  Recent Labs Lab 07/16/16 2319  WBC 3.6*  NEUTROABS 2.6  HGB 12.6  HCT 37.5  MCV 102.7*  PLT 37*   Cardiac Enzymes: No results for input(s): CKTOTAL, CKMB, CKMBINDEX, TROPONINI in the last 168 hours. BNP: Invalid input(s): POCBNP CBG:  Recent  Labs Lab 07/16/16 2322  GLUCAP 87   D-Dimer No results for input(s): DDIMER in the last 72 hours. Hgb A1c No results for input(s): HGBA1C in the last 72 hours. Lipid Profile No results for input(s): CHOL, HDL, LDLCALC, TRIG, CHOLHDL, LDLDIRECT in the last 72 hours. Thyroid function studies No results for input(s): TSH, T4TOTAL, T3FREE, THYROIDAB in the last 72 hours.  Invalid input(s): FREET3 Anemia work up No results for input(s): VITAMINB12, FOLATE, FERRITIN, TIBC, IRON, RETICCTPCT in the last 72 hours. Urinalysis    Component Value Date/Time   COLORURINE AMBER (A) 07/17/2016 0003   APPEARANCEUR CLEAR 07/17/2016 0003   LABSPEC 1.014 07/17/2016 0003   PHURINE 6.0 07/17/2016 0003   GLUCOSEU NEGATIVE 07/17/2016 0003   HGBUR SMALL (A) 07/17/2016 0003   BILIRUBINUR NEGATIVE 07/17/2016 0003   KETONESUR NEGATIVE 07/17/2016 0003   PROTEINUR NEGATIVE 07/17/2016 0003   UROBILINOGEN 1.0 04/18/2012 1030   NITRITE NEGATIVE 07/17/2016 0003   LEUKOCYTESUR LARGE (A) 07/17/2016 0003   Sepsis Labs Invalid input(s): PROCALCITONIN,  WBC,  LACTICIDVEN Microbiology Recent Results (from the past 240 hour(s))  Culture, blood (routine x 2)     Status: None (Preliminary result)   Collection Time: 07/16/16 10:00 PM  Result Value Ref Range  Status   Specimen Description BLOOD RIGHT ANTECUBITAL  Final   Special Requests   Final    BOTTLES DRAWN AEROBIC AND ANAEROBIC Blood Culture adequate volume   Culture  Setup Time   Final    Organism ID to follow GRAM POSITIVE COCCI IN CLUSTERS ANAEROBIC BOTTLE ONLY CRITICAL RESULT CALLED TO, READ BACK BY AND VERIFIED WITH: AElta [place].D. 12:20 07/18/16 ([email or web address])    Culture NO GROWTH 1 DAY  Final   Report Status PENDING  Incomplete  Blood Culture ID Panel (Reflexed)     Status: Abnormal   Collection Time: 07/16/16 10:00 PM  Result Value Ref Range Status   Enterococcus species NOT DETECTED NOT DETECTED Final   Listeria monocytogenes NOT DETECTED NOT DETECTED Final   Staphylococcus species DETECTED (A) NOT DETECTED Final    Comment: Methicillin (oxacillin) resistant coagulase negative staphylococcus. Possible blood culture contaminant (unless isolated from more than one blood culture draw or clinical case suggests pathogenicity). No antibiotic treatment is indicated for blood  culture contaminants. CRITICAL RESULT CALLED TO, READ BACK BY AND VERIFIED WITH: AElta [place].D. 12:20 07/18/16    Staphylococcus aureus NOT DETECTED NOT DETECTED Final   Methicillin resistance DETECTED (A) NOT DETECTED Final    Comment: CRITICAL RESULT CALLED TO, READ BACK BY AND VERIFIED WITH: AElta [place].D. 12:20 07/18/16 ([email or web address])    Streptococcus species NOT DETECTED NOT DETECTED Final   Streptococcus agalactiae NOT DETECTED NOT DETECTED Final   Streptococcus pneumoniae NOT DETECTED NOT DETECTED Final   Streptococcus pyogenes NOT DETECTED NOT DETECTED Final   Acinetobacter baumannii NOT DETECTED NOT DETECTED Final   Enterobacteriaceae species NOT DETECTED NOT DETECTED Final   Enterobacter cloacae complex NOT DETECTED NOT DETECTED Final   Escherichia coli NOT DETECTED NOT DETECTED Final   Klebsiella oxytoca NOT DETECTED NOT DETECTED Final   Klebsiella pneumoniae NOT DETECTED NOT DETECTED  Final   Proteus species NOT DETECTED NOT DETECTED Final   Serratia marcescens NOT DETECTED NOT DETECTED Final   Haemophilus influenzae NOT DETECTED NOT DETECTED Final   Neisseria meningitidis NOT DETECTED NOT DETECTED Final   Pseudomonas aeruginosa NOT DETECTED NOT DETECTED Final   Candida albicans NOT DETECTED NOT DETECTED Final   Candida glabrata NOT DETECTED NOT DETECTED  Final   Candida krusei NOT DETECTED NOT DETECTED Final   Candida parapsilosis NOT DETECTED NOT DETECTED Final   Candida tropicalis NOT DETECTED NOT DETECTED Final  Culture, blood (routine x 2)     Status: None (Preliminary result)   Collection Time: 07/16/16 11:19 PM  Result Value Ref Range Status   Specimen Description BLOOD RIGHT WRIST  Final   Special Requests   Final    BOTTLES DRAWN AEROBIC ONLY Blood Culture adequate volume   Culture NO GROWTH 1 DAY  Final   Report Status PENDING  Incomplete  Urine culture     Status: Abnormal (Preliminary result)   Collection Time: 07/17/16 12:03 AM  Result Value Ref Range Status   Specimen Description URINE, CATHETERIZED  Final   Special Requests NONE  Final   Culture (A)  Final    >=100,000 COLONIES/mL UNIDENTIFIED ORGANISM IDENTIFICATION TO FOLLOW    Report Status PENDING  Incomplete    SIGNED:   Cordelia Poche, MD [facility] 07/18/2016, 4:15 PM Pager [phone]  If 7PM-7AM, please contact night-coverage [email or web address] Password [email or web address]

## 2016-07-18 NOTE — Care Management CC44 (Signed)
Condition Code 44 Documentation Completed  Patient Details  Name: BERKELEY VELDMAN MRN: [id number] Date of Birth: 1945-07-07   Condition Code 44 given:  Yes Patient signature on Condition Code 44 notice:  Yes Documentation of 2 MD's agreement:  Yes Code 44 added to claim:  Yes    Sharin Mons, RN 07/18/2016, 3:46 PM

## 2016-07-18 NOTE — Progress Notes (Signed)
PROGRESS NOTE    LISANDRA MATHISEN  ZCH:[id number] DOB: Jan 17, 1946 DOA: 07/16/2016 PCP: Reynold Bowen, MD   Brief Narrative: [name] is a 71 y.o. female with a history of cirrhosis with end stage liver disease, hypertension. Patient presented with worsening weakness and altered mental status, found to have a high ammonia. Transitioned to comfort care for end stage liver disease.   Assessment & Plan:   Principal Problem:   Acute hepatic encephalopathy Active Problems:   Thrombocytopenia, unspecified ([place])   Cirrhosis, non-alcoholic ([place])   Acute lower UTI   Acute cystitis without hematuria   DNR (do not resuscitate)   Palliative care by specialist   Anxiety state   Acute encephalopathy Likely secondary to hepatic encephalopathy. 1/2 blood cultures positive which could be contributing. Patient transitioned to comfort care. -discontinue lactulose/antibiotics  Cirrhosis Chronic and end stage. Comfort care.  Thrombocytopenia Chronic. Stable.  UTI Initially started on Levaquin. Transitioned to comfort care. -discontinue Levaquin   DVT prophylaxis: Comfort care Code Status: DNR/DNI Family Communication: Multiple family members Disposition Plan: Discharge to residential hospice when bed available   Consultants:   Palliative care medicine  Procedures:   None  Antimicrobials:  Levaquin (5/15>5/16)    Subjective: Patient reports no pain.  Objective: Vitals:   07/17/16 0340 07/17/16 1458 07/17/16 2153 07/18/16 0601  BP: (!) 123/48 (!) 122/52 (!) 134/46 (!) 122/50  Pulse: 80 84 82 89  Resp: 18 17 20 20   Temp: 98.9 F (37.2 C) 98 F (36.7 C) 98.8 F (37.1 C) 98.6 F (37 C)  TempSrc:  Oral Oral   SpO2: 97% 95% 95% 97%  Weight:      Height:        Intake/Output Summary (Last 24 hours) at 07/18/16 1304 Last data filed at 07/18/16 0400  Gross per 24 hour  Intake              100 ml  Output              420 ml  Net             -320 ml   Filed  Weights   07/16/16 2149  Weight: 90.7 kg (200 lb)    Examination:  General exam: Appears calm and comfortable Respiratory system: Clear to auscultation. Respiratory effort normal. Cardiovascular system: S1 & S2 heard, RRR. No murmurs. Gastrointestinal system: Abdomen is nondistended, soft and nontender. Normal bowel sounds heard. Central nervous system: Somnolent but responds to voice commands and oriented to person only. Extremities: No edema. No calf tenderness Skin: No cyanosis. No rashes Psychiatry: Judgement and insight appear impaired. Mood & affect depressed and flat.     Data Reviewed: I have personally reviewed following labs and imaging studies  CBC:  Recent Labs Lab 07/16/16 2319  WBC 3.6*  NEUTROABS 2.6  HGB 12.6  HCT 37.5  MCV 102.7*  PLT 37*   Basic Metabolic Panel:  Recent Labs Lab 07/16/16 2200  NA 141  K 3.3*  CL 104  CO2 31  GLUCOSE 117*  BUN 20  CREATININE 1.31*  CALCIUM 8.4*   GFR: Estimated Creatinine Clearance: 48.4 mL/min (A) (by C-G formula based on SCr of 1.31 mg/dL (H)). Liver Function Tests:  Recent Labs Lab 07/16/16 2319  AST 74*  ALT 33  ALKPHOS 287*  BILITOT 5.0*  PROT 5.7*  ALBUMIN 2.4*   No results for input(s): LIPASE, AMYLASE in the last 168 hours.  Recent Labs Lab 07/16/16 2319  AMMONIA 120*  Coagulation Profile:  Recent Labs Lab 07/16/16 2319  INR 2.06   Cardiac Enzymes: No results for input(s): CKTOTAL, CKMB, CKMBINDEX, TROPONINI in the last 168 hours. BNP (last 3 results) No results for input(s): PROBNP in the last 8760 hours. HbA1C: No results for input(s): HGBA1C in the last 72 hours. CBG:  Recent Labs Lab 07/16/16 2322  GLUCAP 87   Lipid Profile: No results for input(s): CHOL, HDL, LDLCALC, TRIG, CHOLHDL, LDLDIRECT in the last 72 hours. Thyroid Function Tests: No results for input(s): TSH, T4TOTAL, FREET4, T3FREE, THYROIDAB in the last 72 hours. Anemia Panel: No results for input(s):  VITAMINB12, FOLATE, FERRITIN, TIBC, IRON, RETICCTPCT in the last 72 hours. Sepsis Labs:  Recent Labs Lab 07/16/16 2319  LATICACIDVEN 1.4    Recent Results (from the past 240 hour(s))  Culture, blood (routine x 2)     Status: None (Preliminary result)   Collection Time: 07/16/16 10:00 PM  Result Value Ref Range Status   Specimen Description BLOOD RIGHT ANTECUBITAL  Final   Special Requests   Final    BOTTLES DRAWN AEROBIC AND ANAEROBIC Blood Culture adequate volume   Culture  Setup Time   Final    Organism ID to follow GRAM POSITIVE COCCI IN CLUSTERS ANAEROBIC BOTTLE ONLY CRITICAL RESULT CALLED TO, READ BACK BY AND VERIFIED WITH: AElta [place].D. 12:20 07/18/16 ([email or web address])    Culture PENDING  Incomplete   Report Status PENDING  Incomplete  Blood Culture ID Panel (Reflexed)     Status: Abnormal   Collection Time: 07/16/16 10:00 PM  Result Value Ref Range Status   Enterococcus species NOT DETECTED NOT DETECTED Final   Listeria monocytogenes NOT DETECTED NOT DETECTED Final   Staphylococcus species DETECTED (A) NOT DETECTED Final    Comment: Methicillin (oxacillin) resistant coagulase negative staphylococcus. Possible blood culture contaminant (unless isolated from more than one blood culture draw or clinical case suggests pathogenicity). No antibiotic treatment is indicated for blood  culture contaminants. CRITICAL RESULT CALLED TO, READ BACK BY AND VERIFIED WITH: AElta [place].D. 12:20 07/18/16    Staphylococcus aureus NOT DETECTED NOT DETECTED Final   Methicillin resistance DETECTED (A) NOT DETECTED Final    Comment: CRITICAL RESULT CALLED TO, READ BACK BY AND VERIFIED WITH: AElta [place].D. 12:20 07/18/16 ([email or web address])    Streptococcus species NOT DETECTED NOT DETECTED Final   Streptococcus agalactiae NOT DETECTED NOT DETECTED Final   Streptococcus pneumoniae NOT DETECTED NOT DETECTED Final   Streptococcus pyogenes NOT DETECTED NOT DETECTED Final   Acinetobacter  baumannii NOT DETECTED NOT DETECTED Final   Enterobacteriaceae species NOT DETECTED NOT DETECTED Final   Enterobacter cloacae complex NOT DETECTED NOT DETECTED Final   Escherichia coli NOT DETECTED NOT DETECTED Final   Klebsiella oxytoca NOT DETECTED NOT DETECTED Final   Klebsiella pneumoniae NOT DETECTED NOT DETECTED Final   Proteus species NOT DETECTED NOT DETECTED Final   Serratia marcescens NOT DETECTED NOT DETECTED Final   Haemophilus influenzae NOT DETECTED NOT DETECTED Final   Neisseria meningitidis NOT DETECTED NOT DETECTED Final   Pseudomonas aeruginosa NOT DETECTED NOT DETECTED Final   Candida albicans NOT DETECTED NOT DETECTED Final   Candida glabrata NOT DETECTED NOT DETECTED Final   Candida krusei NOT DETECTED NOT DETECTED Final   Candida parapsilosis NOT DETECTED NOT DETECTED Final   Candida tropicalis NOT DETECTED NOT DETECTED Final  Culture, blood (routine x 2)     Status: None (Preliminary result)   Collection Time: 07/16/16 11:19 PM  Result Value Ref Range  Status   Specimen Description BLOOD RIGHT WRIST  Final   Special Requests   Final    BOTTLES DRAWN AEROBIC ONLY Blood Culture adequate volume   Culture PENDING  Incomplete   Report Status PENDING  Incomplete  Urine culture     Status: Abnormal (Preliminary result)   Collection Time: 07/17/16 12:03 AM  Result Value Ref Range Status   Specimen Description URINE, CATHETERIZED  Final   Special Requests NONE  Final   Culture (A)  Final    >=100,000 COLONIES/mL UNIDENTIFIED ORGANISM IDENTIFICATION TO FOLLOW    Report Status PENDING  Incomplete         Radiology Studies: Dg Chest 1 View  Result Date: 07/16/2016 CLINICAL DATA:  Progressive weakness. EXAM: CHEST 1 VIEW COMPARISON:  Chest radiograph 11/07/2015 FINDINGS: Stable cardiomegaly and mediastinal contours. Vascular congestion without overt pulmonary edema. Loop recorder projects over the left chest wall. Low lung volumes. No consolidation, evidence of  pleural fluid or pneumothorax. No acute osseous abnormalities. IMPRESSION: Cardiomegaly with vascular congestion. Electronically Signed   By: Jeb Levering M.D.   On: 07/16/2016 23:52        Scheduled Meds: . white petrolatum       Continuous Infusions:   LOS: 1 day     Cordelia Poche, MD [facility] 07/18/2016, 1:04 PM Pager: [phone]  If 7PM-7AM, please contact night-coverage [email or web address] Password [email or web address] 07/18/2016, 1:04 PM

## 2016-07-18 NOTE — Progress Notes (Signed)
Patient will DC to: [facility] Anticipated DC date: 07/18/16 Family notified: Daughter Transport by: Corey Harold   Per MD patient ready for DC to [facility]. RN, patient, patient's family, and facility notified of DC. Discharge Summary sent to facility. RN given number for report. DC packet on chart. Ambulance transport requested for patient.   CSW signing off.  Cedric Fishman, [place] Social Worker [phone]

## 2016-07-19 LAB — URINE CULTURE: Culture: >=100000 — AB

## 2016-07-20 LAB — CULTURE, BLOOD (ROUTINE X 2): SPECIAL REQUESTS: ADEQUATE

## 2016-07-22 LAB — CULTURE, BLOOD (ROUTINE X 2)
Culture: NO GROWTH
Special Requests: ADEQUATE

## 2016-07-31 ENCOUNTER — Ambulatory Visit (INDEPENDENT_AMBULATORY_CARE_PROVIDER_SITE_OTHER): Payer: Medicare Other | Admitting: *Deleted

## 2016-07-31 DIAGNOSIS — R55 Syncope and collapse: Secondary | ICD-10-CM | POA: Diagnosis not present

## 2016-07-31 NOTE — Progress Notes (Signed)
Carelink Summary Report / Loop Recorder 

## 2016-08-02 LAB — CUP PACEART REMOTE DEVICE CHECK
Implantable Pulse Generator Implant Date: 20170131
MDC IDC SESS DTM: 20180527021125

## 2016-08-03 DEATH — deceased
